# Patient Record
Sex: Female | Born: 1955 | Race: White | Hispanic: No | Smoking: Never smoker
Health system: Southern US, Community
[De-identification: ages and names within clinical notes are randomized; demographics above are authoritative.]

## PROBLEM LIST (undated history)

## (undated) DIAGNOSIS — K219 Gastro-esophageal reflux disease without esophagitis: Secondary | ICD-10-CM

## (undated) DIAGNOSIS — I10 Essential (primary) hypertension: Secondary | ICD-10-CM

## (undated) DIAGNOSIS — L80 Vitiligo: Secondary | ICD-10-CM

## (undated) DIAGNOSIS — H269 Unspecified cataract: Secondary | ICD-10-CM

## (undated) DIAGNOSIS — E079 Disorder of thyroid, unspecified: Secondary | ICD-10-CM

## (undated) HISTORY — DX: Gastro-esophageal reflux disease without esophagitis: K21.9

## (undated) HISTORY — PX: UTERINE FIBROID SURGERY: SHX826

## (undated) HISTORY — DX: Unspecified cataract: H26.9

## (undated) HISTORY — PX: TONSILLECTOMY AND ADENOIDECTOMY: SHX28

## (undated) HISTORY — DX: Vitiligo: L80

## (undated) HISTORY — DX: Essential (primary) hypertension: I10

## (undated) HISTORY — DX: Disorder of thyroid, unspecified: E07.9

---

## 1990-04-23 DIAGNOSIS — B1920 Unspecified viral hepatitis C without hepatic coma: Secondary | ICD-10-CM

## 1990-04-23 HISTORY — DX: Unspecified viral hepatitis C without hepatic coma: B19.20

## 2019-02-24 ENCOUNTER — Other Ambulatory Visit: Payer: Self-pay

## 2019-02-25 ENCOUNTER — Encounter: Payer: Self-pay | Admitting: Family Medicine

## 2019-02-25 ENCOUNTER — Ambulatory Visit (INDEPENDENT_AMBULATORY_CARE_PROVIDER_SITE_OTHER): Payer: 59 | Admitting: Family Medicine

## 2019-02-25 VITALS — BP 149/89 | HR 91 | Temp 98.9°F | Ht 65.0 in | Wt 176.0 lb

## 2019-02-25 DIAGNOSIS — Z8619 Personal history of other infectious and parasitic diseases: Secondary | ICD-10-CM

## 2019-02-25 DIAGNOSIS — E039 Hypothyroidism, unspecified: Secondary | ICD-10-CM | POA: Diagnosis not present

## 2019-02-25 DIAGNOSIS — Z23 Encounter for immunization: Secondary | ICD-10-CM | POA: Diagnosis not present

## 2019-02-25 DIAGNOSIS — R635 Abnormal weight gain: Secondary | ICD-10-CM | POA: Diagnosis not present

## 2019-02-25 DIAGNOSIS — L80 Vitiligo: Secondary | ICD-10-CM | POA: Insufficient documentation

## 2019-02-25 DIAGNOSIS — R030 Elevated blood-pressure reading, without diagnosis of hypertension: Secondary | ICD-10-CM | POA: Insufficient documentation

## 2019-02-25 DIAGNOSIS — R0989 Other specified symptoms and signs involving the circulatory and respiratory systems: Secondary | ICD-10-CM | POA: Insufficient documentation

## 2019-02-25 DIAGNOSIS — E559 Vitamin D deficiency, unspecified: Secondary | ICD-10-CM | POA: Insufficient documentation

## 2019-02-25 DIAGNOSIS — Z7689 Persons encountering health services in other specified circumstances: Secondary | ICD-10-CM

## 2019-02-25 NOTE — Patient Instructions (Signed)
You had labs performed today.  You will be contacted with the results of the labs once they are available, usually in the next 3 business days for routine lab work.  If you have an active my chart account, they will be released to your MyChart.  If you prefer to have these labs released to you via telephone, please let us know.  If you had a pap smear or biopsy performed, expect to be contacted in about 7-10 days.  DASH Eating Plan DASH stands for "Dietary Approaches to Stop Hypertension." The DASH eating plan is a healthy eating plan that has been shown to reduce high blood pressure (hypertension). It may also reduce your risk for type 2 diabetes, heart disease, and stroke. The DASH eating plan may also help with weight loss. What are tips for following this plan?  General guidelines  Avoid eating more than 2,300 mg (milligrams) of salt (sodium) a day. If you have hypertension, you may need to reduce your sodium intake to 1,500 mg a day.  Limit alcohol intake to no more than 1 drink a day for nonpregnant women and 2 drinks a day for men. One drink equals 12 oz of beer, 5 oz of wine, or 1 oz of hard liquor.  Work with your health care provider to maintain a healthy body weight or to lose weight. Ask what an ideal weight is for you.  Get at least 30 minutes of exercise that causes your heart to beat faster (aerobic exercise) most days of the week. Activities may include walking, swimming, or biking.  Work with your health care provider or diet and nutrition specialist (dietitian) to adjust your eating plan to your individual calorie needs. Reading food labels   Check food labels for the amount of sodium per serving. Choose foods with less than 5 percent of the Daily Value of sodium. Generally, foods with less than 300 mg of sodium per serving fit into this eating plan.  To find whole grains, look for the word "whole" as the first word in the ingredient list. Shopping  Buy products labeled  as "low-sodium" or "no salt added."  Buy fresh foods. Avoid canned foods and premade or frozen meals. Cooking  Avoid adding salt when cooking. Use salt-free seasonings or herbs instead of table salt or sea salt. Check with your health care provider or pharmacist before using salt substitutes.  Do not fry foods. Cook foods using healthy methods such as baking, boiling, grilling, and broiling instead.  Cook with heart-healthy oils, such as olive, canola, soybean, or sunflower oil. Meal planning  Eat a balanced diet that includes: ? 5 or more servings of fruits and vegetables each day. At each meal, try to fill half of your plate with fruits and vegetables. ? Up to 6-8 servings of whole grains each day. ? Less than 6 oz of lean meat, poultry, or fish each day. A 3-oz serving of meat is about the same size as a deck of cards. One egg equals 1 oz. ? 2 servings of low-fat dairy each day. ? A serving of nuts, seeds, or beans 5 times each week. ? Heart-healthy fats. Healthy fats called Omega-3 fatty acids are found in foods such as flaxseeds and coldwater fish, like sardines, salmon, and mackerel.  Limit how much you eat of the following: ? Canned or prepackaged foods. ? Food that is high in trans fat, such as fried foods. ? Food that is high in saturated fat, such as fatty meat. ?  Sweets, desserts, sugary drinks, and other foods with added sugar. ? Full-fat dairy products.  Do not salt foods before eating.  Try to eat at least 2 vegetarian meals each week.  Eat more home-cooked food and less restaurant, buffet, and fast food.  When eating at a restaurant, ask that your food be prepared with less salt or no salt, if possible. What foods are recommended? The items listed may not be a complete list. Talk with your dietitian about what dietary choices are best for you. Grains Whole-grain or whole-wheat bread. Whole-grain or whole-wheat pasta. Brown rice. [place]. Bulgur. Whole-grain  and low-sodium cereals. Pita bread. Low-fat, low-sodium crackers. Whole-wheat flour tortillas. Vegetables Fresh or frozen vegetables (raw, steamed, roasted, or grilled). Low-sodium or reduced-sodium tomato and vegetable juice. Low-sodium or reduced-sodium tomato sauce and tomato paste. Low-sodium or reduced-sodium canned vegetables. Fruits All fresh, dried, or frozen fruit. Canned fruit in natural juice (without added sugar). Meat and other protein foods Skinless chicken or [place]. Ground chicken or [place]. Pork with fat trimmed off. Fish and seafood. Egg whites. Dried beans, peas, or lentils. Unsalted nuts, nut butters, and seeds. Unsalted canned beans. Lean cuts of beef with fat trimmed off. Low-sodium, lean deli meat. Dairy Low-fat (1%) or fat-free (skim) milk. Fat-free, low-fat, or reduced-fat cheeses. Nonfat, low-sodium ricotta or cottage cheese. Low-fat or nonfat yogurt. Low-fat, low-sodium cheese. Fats and oils Soft margarine without trans fats. Vegetable oil. Low-fat, reduced-fat, or light mayonnaise and salad dressings (reduced-sodium). Canola, safflower, olive, soybean, and sunflower oils. Avocado. Seasoning and other foods Herbs. Spices. Seasoning mixes without salt. Unsalted popcorn and pretzels. Fat-free sweets. What foods are not recommended? The items listed may not be a complete list. Talk with your dietitian about what dietary choices are best for you. Grains Baked goods made with fat, such as croissants, muffins, or some breads. Dry pasta or rice meal packs. Vegetables Creamed or fried vegetables. Vegetables in a cheese sauce. Regular canned vegetables (not low-sodium or reduced-sodium). Regular canned tomato sauce and paste (not low-sodium or reduced-sodium). Regular tomato and vegetable juice (not low-sodium or reduced-sodium). Angie Fava. Olives. Fruits Canned fruit in a light or heavy syrup. Fried fruit. Fruit in cream or butter sauce. Meat and other protein foods Fatty cuts  of meat. Ribs. Fried meat. Berniece Salines. Sausage. Bologna and other processed lunch meats. Salami. Fatback. Hotdogs. Bratwurst. Salted nuts and seeds. Canned beans with added salt. Canned or smoked fish. Whole eggs or egg yolks. Chicken or [place] with skin. Dairy Whole or 2% milk, cream, and half-and-half. Whole or full-fat cream cheese. Whole-fat or sweetened yogurt. Full-fat cheese. Nondairy creamers. Whipped toppings. Processed cheese and cheese spreads. Fats and oils Butter. Stick margarine. Lard. Shortening. Ghee. Bacon fat. Tropical oils, such as coconut, palm kernel, or palm oil. Seasoning and other foods Salted popcorn and pretzels. Onion salt, garlic salt, seasoned salt, table salt, and sea salt. Worcestershire sauce. Tartar sauce. Barbecue sauce. Teriyaki sauce. Soy sauce, including reduced-sodium. Steak sauce. Canned and packaged gravies. Fish sauce. Oyster sauce. Cocktail sauce. Horseradish that you find on the shelf. Ketchup. Mustard. Meat flavorings and tenderizers. Bouillon cubes. Hot sauce and Tabasco sauce. Premade or packaged marinades. Premade or packaged taco seasonings. Relishes. Regular salad dressings. Where to find more information:  National Heart, Lung, and [place]: https://wilson-eaton.com/  American Heart Association: www.heart.org Summary  The DASH eating plan is a healthy eating plan that has been shown to reduce high blood pressure (hypertension). It may also reduce your risk for type 2 diabetes,  heart disease, and stroke.  With the DASH eating plan, you should limit salt (sodium) intake to 2,300 mg a day. If you have hypertension, you may need to reduce your sodium intake to 1,500 mg a day.  When on the DASH eating plan, aim to eat more fresh fruits and vegetables, whole grains, lean proteins, low-fat dairy, and heart-healthy fats.  Work with your health care provider or diet and nutrition specialist (dietitian) to adjust your eating plan to your individual calorie needs.  This information is not intended to replace advice given to you by your health care provider. Make sure you discuss any questions you have with your health care provider. Document Released: 03/29/2011 Document Revised: 03/22/2017 Document Reviewed: 04/02/2016 Elsevier Patient Education  2020 [employer].

## 2019-02-25 NOTE — Progress Notes (Signed)
 Subjective: CC:establish care, vitamin D deficiency, hypothyroidism HPI: Courtney White is a 63 y.o. female presenting to clinic today for:  1.  Vitamin D deficiency Patient was noted to be vitamin D deficient by her previous PCP.  She is status post completion of 50,000 international units for several weeks.  She would like to know she should continue this medication or not.  2.  Hypothyroidism Patient reports history of hypothyroidism.  She notes that she was previously treated with medication but for some reason it was discontinued.  Unsure if this was checked with her previous labs during her physical last year.  She does report a 20 pound weight gain over the last 3 years it seemed to be more prominent after the passing of her husband.  She denies any history of head or neck surgery.  No known family history of immediate family affected by thyroid disorder but she does have a maternal aunt with thyroid disorder.  She does report a tickle in her throat but denies any change in voice or difficulty swallowing.  No tremor.  No change in bowel habits.  Not on any biotin containing products.  3.  History of hepatitis C Patient reports history of hepatitis C, diagnosed about 4 to 5 years ago.  She is status post treatment and noted to be cleared of the virus.  She has not been having to follow-up with hepatology or gastroenterology for this.  She reports having had a colonoscopy in the last couple of years that was notable for small polyps that did not require resection.  She was told to follow-up in 10 years.  This was done in [place].  Denies any GI symptoms.  4.  Elevated blood pressure Patient noted that she was having an elevated blood pressure to get a previous visit but was never started on any antihypertensives.  Denies any chest pain, shortness of breath, lower extremity edema, dizziness or falls.  She admits that she could work on diet for both weight loss and blood pressure.  She is a  non-smoker.  No drug use.  Occasional alcohol use that is social.  Past Medical History:  Diagnosis Date  . Hepatitis C 1992   s/p treatment.  she has cleared virus  . Thyroid disease   . Vitiligo    History reviewed. No pertinent surgical history. Social History   Socioeconomic History  . Marital status: Significant Other    Spouse name: Not on file  . Number of children: 0  . Years of education: Not on file  . Highest education level: Not on file  Occupational History  . Not on file  Social Needs  . Financial resource strain: Not on file  . Food insecurity    Worry: Not on file    Inability: Not on file  . Transportation needs    Medical: Not on file    Non-medical: Not on file  Tobacco Use  . Smoking status: Never Smoker  . Smokeless tobacco: Never Used  Substance and Sexual Activity  . Alcohol use: Not on file    Comment: occ  . Drug use: Not Currently  . Sexual activity: Yes    Birth control/protection: Post-menopausal  Lifestyle  . Physical activity    Days per week: Not on file    Minutes per session: Not on file  . Stress: Not on file  Relationships  . Social connections    Talks on phone: Not on file    Gets together: Not   on file    Attends religious service: Not on file    Active member of club or organization: Not on file    Attends meetings of clubs or organizations: Not on file    Relationship status: Not on file  . Intimate partner violence    Fear of current or ex partner: Not on file    Emotionally abused: Not on file    Physically abused: Not on file    Forced sexual activity: Not on file  Other Topics Concern  . Not on file  Social History Narrative   Patient is a retired [employer] architect.  She is a widower and notes that she moved from the [place] area after being reunited with a high school sweetheart whom she is now engaged to.   She lives locally in [place].   No children.   Current Meds  Medication Sig  . Vitamin D,  Ergocalciferol, (DRISDOL) 1.25 MG (50000 UT) CAPS capsule TAKE ONE CAPSULE (50,000 UNITS DOSE) BY MOUTH ONCE A WEEK   Family History  Problem Relation Age of Onset  . Diabetes Mother   . Heart disease Mother   . Depression Mother   . Lung cancer Father   . Hypertension Sister   . Hypertension Sister   . Thyroid disease Maternal Aunt    No Known Allergies   Health Maintenance: UTD  ROS: Per HPI  Objective: Office vital signs reviewed. BP (!) 149/89   Pulse 91   Temp 98.9 F (37.2 C) (Temporal)   Ht 5' 5" (1.651 m)   Wt 176 lb (79.8 kg)   BMI 29.29 kg/m   Physical Examination:  General: Awake, alert, well nourished, No acute distress HEENT: Normal; goiter noted at base of neck.  No exophthalmos Cardio: regular rate and rhythm, S1S2 heard, no murmurs appreciated Pulm: clear to auscultation bilaterally, no wheezes, rhonchi or rales; normal work of breathing on room air Extremities: warm, well perfused, No edema, cyanosis or clubbing; +2 pulses bilaterally MSK: Normal gait and station Skin: dry; intact; no rashes; normal temperature.  Depigmentation of hands noted in a vitiliginous pattern Neuro: No resting tremor appreciated  Psych: Mood stable, speech normal, affect appropriate, pleasant and interactive Depression screen [facility] 2/9 02/25/2019  Decreased Interest 0  Down, Depressed, Hopeless 0  PHQ - 2 Score 0  Altered sleeping 0  Tired, decreased energy 0  Change in appetite 0  Feeling bad or failure about yourself  0  Trouble concentrating 0  Moving slowly or fidgety/restless 0  Suicidal thoughts 0  PHQ-9 Score 0    Assessment/ Plan: 63 y.o. female   1. Tickle in throat Possibly related to what appears to be a goiter on exam versus allergies.  I instructed her to go ahead and start an oral antihistamine.  We will check thyroid levels.  2. Establishing care with new doctor, encounter for  3. Vitiligo  4. Weight gain Possibly related to untreated  hypothyroidism.  Reinforced lifestyle modification, particularly given elevated blood pressure during today's visit  5. Acquired hypothyroidism Check thyroid level, liver function tests - Thyroid Panel With TSH - CMP14+EGFR  6. Vitamin D deficiency Recheck vitamin D level given status post treatment with vitamin D replacement - Vitamin D 25 hydroxy  7. Elevated blood pressure reading in office without diagnosis of hypertension Plan for DASH diet, lifestyle modification.  She will follow-up in 4 weeks to get rechecked.  If unable to achieve appropriate blood pressure of less than 140/90 with lifestyle  modification, plan to initiate oral antihypertensive.  8. History of hepatitis C S/p treatment and clearance of virus.   Ashly M Gottschalk, DO [facility] [phone]   

## 2019-02-26 LAB — CMP14+EGFR
ALT: 19 IU/L (ref 0–32)
AST: 19 IU/L (ref 0–40)
Albumin/Globulin Ratio: 1.6 (ref 1.2–2.2)
Albumin: 4.6 g/dL (ref 3.8–4.8)
Alkaline Phosphatase: 95 IU/L (ref 39–117)
BUN/Creatinine Ratio: 18 (ref 12–28)
BUN: 11 mg/dL (ref 8–27)
Bilirubin Total: 0.3 mg/dL (ref 0.0–1.2)
CO2: 25 mmol/L (ref 20–29)
Calcium: 9.9 mg/dL (ref 8.7–10.3)
Chloride: 104 mmol/L (ref 96–106)
Creatinine, Ser: 0.62 mg/dL (ref 0.57–1.00)
GFR calc Af Amer: 110 mL/min/{1.73_m2} (ref >59)
GFR calc non Af Amer: 96 mL/min/{1.73_m2} (ref >59)
Globulin, Total: 2.8 g/dL (ref 1.5–4.5)
Glucose: 132 mg/dL — ABNORMAL HIGH (ref 65–99)
Potassium: 4.2 mmol/L (ref 3.5–5.2)
Sodium: 140 mmol/L (ref 134–144)
Total Protein: 7.4 g/dL (ref 6.0–8.5)

## 2019-02-26 LAB — THYROID PANEL WITH TSH
Free Thyroxine Index: 1.7 (ref 1.2–4.9)
T3 Uptake Ratio: 24 % (ref 24–39)
T4, Total: 7 ug/dL (ref 4.5–12.0)
TSH: 6.59 u[IU]/mL — ABNORMAL HIGH (ref 0.450–4.500)

## 2019-02-26 LAB — VITAMIN D 25 HYDROXY (VIT D DEFICIENCY, FRACTURES): Vit D, 25-Hydroxy: 29.6 ng/mL — ABNORMAL LOW (ref 30.0–100.0)

## 2019-02-27 ENCOUNTER — Telehealth: Payer: Self-pay | Admitting: Family Medicine

## 2019-02-27 DIAGNOSIS — E039 Hypothyroidism, unspecified: Secondary | ICD-10-CM

## 2019-02-27 MED ORDER — LEVOTHYROXINE SODIUM 75 MCG PO TABS: 75.0000 ug | ORAL_TABLET | Freq: Every day | ORAL | 0 refills | Status: DC

## 2019-02-27 NOTE — Telephone Encounter (Signed)
Attempted to reach out to her with regards to her recent laboratory results.  Her thyroid function is not controlled.  I am going to prescribe her thyroid replacement that is weight-based.  Plan for 75 mcg every morning with recheck of thyroid in about 6 to 8 weeks.  Sugar was slightly elevated but this may reflect nonfasting status.    Liver function, electrolytes and kidney function appear normal Vitamin D was still slightly low but I do not think that she needs high-dose replacement going forward.  Okay to take vitamin D OTC 800 international units daily.

## 2019-03-03 ENCOUNTER — Other Ambulatory Visit: Payer: Self-pay | Admitting: Family Medicine

## 2019-03-30 ENCOUNTER — Other Ambulatory Visit: Payer: Self-pay

## 2019-03-31 ENCOUNTER — Ambulatory Visit (INDEPENDENT_AMBULATORY_CARE_PROVIDER_SITE_OTHER): Payer: 59 | Admitting: Family Medicine

## 2019-03-31 ENCOUNTER — Encounter: Payer: Self-pay | Admitting: Family Medicine

## 2019-03-31 VITALS — BP 170/74 | HR 93 | Temp 98.4°F | Ht 65.0 in | Wt 173.0 lb

## 2019-03-31 DIAGNOSIS — F439 Reaction to severe stress, unspecified: Secondary | ICD-10-CM | POA: Insufficient documentation

## 2019-03-31 DIAGNOSIS — I10 Essential (primary) hypertension: Secondary | ICD-10-CM | POA: Diagnosis not present

## 2019-03-31 DIAGNOSIS — E039 Hypothyroidism, unspecified: Secondary | ICD-10-CM | POA: Diagnosis not present

## 2019-03-31 DIAGNOSIS — Z818 Family history of other mental and behavioral disorders: Secondary | ICD-10-CM | POA: Diagnosis not present

## 2019-03-31 DIAGNOSIS — R739 Hyperglycemia, unspecified: Secondary | ICD-10-CM

## 2019-03-31 LAB — BAYER DCA HB A1C WAIVED: HB A1C (BAYER DCA - WAIVED): 5.8 % (ref <7.0)

## 2019-03-31 NOTE — Patient Instructions (Signed)
I will reach out to our clinical social worker for additional resources.  If you are having trouble with sleep, you may safely take 3 mg of melatonin at bedtime.  This will not interact with your medications.   I have given you information on DASH diet and how to check blood pressure at home.    Dementia Caregiver Guide Dementia is a term used to describe a number of symptoms that affect memory and thinking. The most common symptoms include:  Memory loss.  Trouble with language and communication.  Trouble concentrating.  Poor judgment.  Problems with reasoning.  Child-like behavior and language.  Extreme anxiety.  Angry outbursts.  Wandering from home or public places. Dementia usually gets worse slowly over time. In the early stages, people with dementia can stay independent and safe with some help. In later stages, they need help with daily tasks such as dressing, grooming, and using the bathroom. How to help the person with dementia cope Dementia can be frightening and confusing. Here are some tips to help the person with dementia cope with changes caused by the disease. General tips  Keep the person on track with his or her routine.  Try to identify areas where the person may need help.  Be supportive, patient, calm, and encouraging.  Gently remind the person that adjusting to changes takes time.  Help with the tasks that the person has asked for help with.  Keep the person involved in daily tasks and decisions as much as possible.  Encourage conversation, but try not to get frustrated or harried if the person struggles to find words or does not seem to appreciate your help. Communication tips  When the person is talking or seems frustrated, make eye contact and hold the person's hand.  Ask specific questions that need yes or no answers.  Use simple words, short sentences, and a calm voice. Only give one direction at a time.  When offering choices, limit them to  just 1 or 2.  Avoid correcting the person in a negative way.  If the person is struggling to find the right words, gently try to help him or her. How to recognize symptoms of stress Symptoms of stress in caregivers include:  Feeling frustrated or angry with the person with dementia.  Denying that the person has dementia or that his or her symptoms will not improve.  Feeling hopeless and unappreciated.  Difficulty sleeping.  Difficulty concentrating.  Feeling anxious, irritable, or depressed.  Developing stress-related health problems.  Feeling like you have too little time for your own life. Follow these instructions at home:   Make sure that you and the person you are caring for: ? Get regular sleep. ? Exercise regularly. ? Eat regular, nutritious meals. ? Drink enough fluid to keep your urine clear or pale yellow. ? Take over-the-counter and prescription medicines only as told by your health care providers. ? Attend all scheduled health care appointments.  Join a support group with others who are caregivers.  Ask about respite care resources so that you can have a regular break from the stress of caregiving.  Look for signs of stress in yourself and in the person you are caring for. If you notice signs of stress, take steps to manage it.  Consider any safety risks and take steps to avoid them.  Organize medications in a pill box for each day of the week.  Create a plan to handle any legal or financial matters. Get legal or financial  advice if needed.  Keep a calendar in a central location to remind the person of appointments or other activities. Tips for reducing the risk of injury  Keep floors clear of clutter. Remove rugs, magazine racks, and floor lamps.  Keep hallways well lit, especially at night.  Put a handrail and nonslip mat in the bathtub or shower.  Put childproof locks on cabinets that contain dangerous items, such as medicines, alcohol, guns,  toxic cleaning items, sharp tools or utensils, matches, and lighters.  Put the locks in places where the person cannot see or reach them easily. This will help ensure that the person does not wander out of the house and get lost.  Be prepared for emergencies. Keep a list of emergency phone numbers and addresses in a convenient area.  Remove car keys and lock garage doors so that the person does not try to get in the car and drive.  Have the person wear a bracelet that tracks locations and identifies the person as having memory problems. This should be worn at all times for safety. Where to find support: Many individuals and organizations offer support. These include:  Support groups for people with dementia and for caregivers.  Counselors or therapists.  Home health care services.  Adult day care centers. Where to find more information Alzheimer's Association: [employer] Contact a health care provider if:  The person's health is rapidly getting worse.  You are no longer able to care for the person.  Caring for the person is affecting your physical and emotional health.  The person threatens himself or herself, you, or anyone else. Summary  Dementia is a term used to describe a number of symptoms that affect memory and thinking.  Dementia usually gets worse slowly over time.  Take steps to reduce the person's risk of injury, and to plan for future care.  Caregivers need support, relief from caregiving, and time for their own lives. This information is not intended to replace advice given to you by your health care provider. Make sure you discuss any questions you have with your health care provider. Document Released: 03/13/2016 Document Revised: 03/22/2017 Document Reviewed: 03/13/2016 Elsevier Patient Education  2020 [employer]. How to Take Your Blood Pressure You can take your blood pressure at home with a machine. You may need to check your blood pressure at home:   To check if you have high blood pressure (hypertension).  To check your blood pressure over time.  To make sure your blood pressure medicine is working. Supplies needed: You will need a blood pressure machine, or monitor. You can buy one at a drugstore or online. When choosing one:  Choose one with an arm cuff.  Choose one that wraps around your upper arm. Only one finger should fit between your arm and the cuff.  Do not choose one that measures your blood pressure from your wrist or finger. Your doctor can suggest a monitor. How to prepare Avoid these things for 30 minutes before checking your blood pressure:  Drinking caffeine.  Drinking alcohol.  Eating.  Smoking.  Exercising. Five minutes before checking your blood pressure:  Pee.  Sit in a dining chair. Avoid sitting in a soft couch or armchair.  Be quiet. Do not talk. How to take your blood pressure Follow the instructions that came with your machine. If you have a digital blood pressure monitor, these may be the instructions: 1. Sit up straight. 2. Place your feet on the floor. Do not cross  your ankles or legs. 3. Rest your left arm at the level of your heart. You may rest it on a table, desk, or chair. 4. Pull up your shirt sleeve. 5. Wrap the blood pressure cuff around the upper part of your left arm. The cuff should be 1 inch (2.5 cm) above your elbow. It is best to wrap the cuff around bare skin. 6. Fit the cuff snugly around your arm. You should be able to place only one finger between the cuff and your arm. 7. Put the cord inside the groove of your elbow. 8. Press the power button. 9. Sit quietly while the cuff fills with air and loses air. 10. Write down the numbers on the screen. 11. Wait 2-3 minutes and then repeat steps 1-10. What do the numbers mean? Two numbers make up your blood pressure. The first number is called systolic pressure. The second is called diastolic pressure. An example of a blood  pressure reading is "120 over 80" (or 120/80). If you are an adult and do not have a medical condition, use this guide to find out if your blood pressure is normal: Normal  First number: below 120.  Second number: below 80. Elevated  First number: 120-129.  Second number: below 80. Hypertension stage 1  First number: 130-139.  Second number: 80-89. Hypertension stage 2  First number: 140 or above.  Second number: 70 or above. Your blood pressure is above normal even if only the top or bottom number is above normal. Follow these instructions at home:  Check your blood pressure as often as your doctor tells you to.  Take your monitor to your next doctor's appointment. Your doctor will: ? Make sure you are using it correctly. ? Make sure it is working right.  Make sure you understand what your blood pressure numbers should be.  Tell your doctor if your medicines are causing side effects. Contact a doctor if:  Your blood pressure keeps being high. Get help right away if:  Your first blood pressure number is higher than 180.  Your second blood pressure number is higher than 120. This information is not intended to replace advice given to you by your health care provider. Make sure you discuss any questions you have with your health care provider. Document Released: 03/22/2008 Document Revised: 03/22/2017 Document Reviewed: 09/16/2015 Elsevier Patient Education  2020 [place] DASH stands for "Dietary Approaches to Stop Hypertension." The DASH eating plan is a healthy eating plan that has been shown to reduce high blood pressure (hypertension). It may also reduce your risk for type 2 diabetes, heart disease, and stroke. The DASH eating plan may also help with weight loss. What are tips for following this plan?  General guidelines  Avoid eating more than 2,300 mg (milligrams) of salt (sodium) a day. If you have hypertension, you may need to reduce  your sodium intake to 1,500 mg a day.  Limit alcohol intake to no more than 1 drink a day for nonpregnant women and 2 drinks a day for men. One drink equals 12 oz of beer, 5 oz of wine, or 1 oz of hard liquor.  Work with your health care provider to maintain a healthy body weight or to lose weight. Ask what an ideal weight is for you.  Get at least 30 minutes of exercise that causes your heart to beat faster (aerobic exercise) most days of the week. Activities may include walking, swimming, or biking.  Work with  your health care provider or diet and nutrition specialist (dietitian) to adjust your eating plan to your individual calorie needs. Reading food labels   Check food labels for the amount of sodium per serving. Choose foods with less than 5 percent of the Daily Value of sodium. Generally, foods with less than 300 mg of sodium per serving fit into this eating plan.  To find whole grains, look for the word "whole" as the first word in the ingredient list. Shopping  Buy products labeled as "low-sodium" or "no salt added."  Buy fresh foods. Avoid canned foods and premade or frozen meals. Cooking  Avoid adding salt when cooking. Use salt-free seasonings or herbs instead of table salt or sea salt. Check with your health care provider or pharmacist before using salt substitutes.  Do not fry foods. Cook foods using healthy methods such as baking, boiling, grilling, and broiling instead.  Cook with heart-healthy oils, such as olive, canola, soybean, or sunflower oil. Meal planning  Eat a balanced diet that includes: ? 5 or more servings of fruits and vegetables each day. At each meal, try to fill half of your plate with fruits and vegetables. ? Up to 6-8 servings of whole grains each day. ? Less than 6 oz of lean meat, poultry, or fish each day. A 3-oz serving of meat is about the same size as a deck of cards. One egg equals 1 oz. ? 2 servings of low-fat dairy each day. ? A serving  of nuts, seeds, or beans 5 times each week. ? Heart-healthy fats. Healthy fats called Omega-3 fatty acids are found in foods such as flaxseeds and coldwater fish, like sardines, salmon, and mackerel.  Limit how much you eat of the following: ? Canned or prepackaged foods. ? Food that is high in trans fat, such as fried foods. ? Food that is high in saturated fat, such as fatty meat. ? Sweets, desserts, sugary drinks, and other foods with added sugar. ? Full-fat dairy products.  Do not salt foods before eating.  Try to eat at least 2 vegetarian meals each week.  Eat more home-cooked food and less restaurant, buffet, and fast food.  When eating at a restaurant, ask that your food be prepared with less salt or no salt, if possible. What foods are recommended? The items listed may not be a complete list. Talk with your dietitian about what dietary choices are best for you. Grains Whole-grain or whole-wheat bread. Whole-grain or whole-wheat pasta. Brown rice. [place]. Bulgur. Whole-grain and low-sodium cereals. Pita bread. Low-fat, low-sodium crackers. Whole-wheat flour tortillas. Vegetables Fresh or frozen vegetables (raw, steamed, roasted, or grilled). Low-sodium or reduced-sodium tomato and vegetable juice. Low-sodium or reduced-sodium tomato sauce and tomato paste. Low-sodium or reduced-sodium canned vegetables. Fruits All fresh, dried, or frozen fruit. Canned fruit in natural juice (without added sugar). Meat and other protein foods Skinless chicken or [place]. Ground chicken or [place]. Pork with fat trimmed off. Fish and seafood. Egg whites. Dried beans, peas, or lentils. Unsalted nuts, nut butters, and seeds. Unsalted canned beans. Lean cuts of beef with fat trimmed off. Low-sodium, lean deli meat. Dairy Low-fat (1%) or fat-free (skim) milk. Fat-free, low-fat, or reduced-fat cheeses. Nonfat, low-sodium ricotta or cottage cheese. Low-fat or nonfat yogurt. Low-fat, low-sodium  cheese. Fats and oils Soft margarine without trans fats. Vegetable oil. Low-fat, reduced-fat, or light mayonnaise and salad dressings (reduced-sodium). Canola, safflower, olive, soybean, and sunflower oils. Avocado. Seasoning and other foods Herbs. Spices. Seasoning mixes without salt.  Unsalted popcorn and pretzels. Fat-free sweets. What foods are not recommended? The items listed may not be a complete list. Talk with your dietitian about what dietary choices are best for you. Grains Baked goods made with fat, such as croissants, muffins, or some breads. Dry pasta or rice meal packs. Vegetables Creamed or fried vegetables. Vegetables in a cheese sauce. Regular canned vegetables (not low-sodium or reduced-sodium). Regular canned tomato sauce and paste (not low-sodium or reduced-sodium). Regular tomato and vegetable juice (not low-sodium or reduced-sodium). Angie Fava. Olives. Fruits Canned fruit in a light or heavy syrup. Fried fruit. Fruit in cream or butter sauce. Meat and other protein foods Fatty cuts of meat. Ribs. Fried meat. Berniece Salines. Sausage. Bologna and other processed lunch meats. Salami. Fatback. Hotdogs. Bratwurst. Salted nuts and seeds. Canned beans with added salt. Canned or smoked fish. Whole eggs or egg yolks. Chicken or [place] with skin. Dairy Whole or 2% milk, cream, and half-and-half. Whole or full-fat cream cheese. Whole-fat or sweetened yogurt. Full-fat cheese. Nondairy creamers. Whipped toppings. Processed cheese and cheese spreads. Fats and oils Butter. Stick margarine. Lard. Shortening. Ghee. Bacon fat. Tropical oils, such as coconut, palm kernel, or palm oil. Seasoning and other foods Salted popcorn and pretzels. Onion salt, garlic salt, seasoned salt, table salt, and sea salt. Worcestershire sauce. Tartar sauce. Barbecue sauce. Teriyaki sauce. Soy sauce, including reduced-sodium. Steak sauce. Canned and packaged gravies. Fish sauce. Oyster sauce. Cocktail sauce. Horseradish  that you find on the shelf. Ketchup. Mustard. Meat flavorings and tenderizers. Bouillon cubes. Hot sauce and Tabasco sauce. Premade or packaged marinades. Premade or packaged taco seasonings. Relishes. Regular salad dressings. Where to find more information:  National Heart, Lung, and [place]: https://wilson-eaton.com/  American Heart Association: www.heart.org Summary  The DASH eating plan is a healthy eating plan that has been shown to reduce high blood pressure (hypertension). It may also reduce your risk for type 2 diabetes, heart disease, and stroke.  With the DASH eating plan, you should limit salt (sodium) intake to 2,300 mg a day. If you have hypertension, you may need to reduce your sodium intake to 1,500 mg a day.  When on the DASH eating plan, aim to eat more fresh fruits and vegetables, whole grains, lean proteins, low-fat dairy, and heart-healthy fats.  Work with your health care provider or diet and nutrition specialist (dietitian) to adjust your eating plan to your individual calorie needs. This information is not intended to replace advice given to you by your health care provider. Make sure you discuss any questions you have with your health care provider. Document Released: 03/29/2011 Document Revised: 03/22/2017 Document Reviewed: 04/02/2016 Elsevier Patient Education  2020 [employer].

## 2019-03-31 NOTE — Progress Notes (Signed)
Subjective: CC: Stress at home PCP: [name], DO ME:[id number] Courtney White is a 63 y.o. female presenting to clinic today for:  1.  Stress at home Patient reports some increased stress at home surrounding her mother.  She notes that her mother has early stages of dementia and has history of OCD/hoarding disorder.  She recently cleaned out her mother's home in place many of the excess belongings in storage.  Her mother waxes and wanes in mood and has often place a lot of blame on the patient with regards to her possessions not being readily available.  She is wondering if there are any options for counseling and/or support for caregivers.  She is the primary caregiver for her mother, who does reside independently.  Patient is the oldest of her siblings and actually lives a furthest away but is often the one who cares for her mother.  She does not feel that she is having any depression or anxiety symptoms but does identify stress being an issue as of late.  She has not yet reached out to any support groups with the Alzheimer's Association but would be interested in doing this.  She did report some difficulty with sleep, particularly when she has had negative interactions with her mother.  She is not taking anything for this.  She feels well supported by her fianc.  2.  Hypertension Patient with persistently elevated blood pressure.  She is not keeping an eye on her blood pressures.  She does identify that she could be better with salt intake.  No chest pain, shortness of breath, dizziness or falls.  3.  Hypothyroidism Patient reports compliance with Synthroid 75 mcg daily.  She has had some difficulty with sleep as above but this is intermittent and seems to be situational.  No heart palpitations, tremor, change in energy or bowel habits.  She has had a 3 pound weight loss which she seems pleased about.  ROS: Per HPI  No Known Allergies Past Medical History:  Diagnosis Date  . Hepatitis C  1992   s/p treatment.  she has cleared virus  . Thyroid disease   . Vitiligo     Current Outpatient Medications:  .  levothyroxine (SYNTHROID) 75 MCG tablet, Take 1 tablet (75 mcg total) by mouth daily., Disp: 90 tablet, Rfl: 0 .  Vitamin D, Ergocalciferol, (DRISDOL) 1.25 MG (50000 UT) CAPS capsule, TAKE ONE CAPSULE (50,000 UNITS DOSE) BY MOUTH ONCE A WEEK, Disp: 4 capsule, Rfl: 0 Social History   Socioeconomic History  . Marital status: Significant Other    Spouse name: Not on file  . Number of children: 0  . Years of education: Not on file  . Highest education level: Not on file  Occupational History  . Not on file  Social Needs  . Financial resource strain: Not on file  . Food insecurity    Worry: Not on file    Inability: Not on file  . Transportation needs    Medical: Not on file    Non-medical: Not on file  Tobacco Use  . Smoking status: Never Smoker  . Smokeless tobacco: Never Used  Substance and Sexual Activity  . Alcohol use: Not on file    Comment: occ  . Drug use: Not Currently  . Sexual activity: Yes    Birth control/protection: Post-menopausal  Lifestyle  . Physical activity    Days per week: Not on file    Minutes per session: Not on file  . Stress: Not  on file  Relationships  . Social Herbalist on phone: Not on file    Gets together: Not on file    Attends religious service: Not on file    Active member of club or organization: Not on file    Attends meetings of clubs or organizations: Not on file    Relationship status: Not on file  . Intimate partner violence    Fear of current or ex partner: Not on file    Emotionally abused: Not on file    Physically abused: Not on file    Forced sexual activity: Not on file  Other Topics Concern  . Not on file  Social History Narrative   Patient is a retired [employer] architect.  She is a widower and notes that she moved from the [place] area after being reunited with a high school sweetheart whom  she is now engaged to.   She lives locally in [place].   No children.   Family History  Problem Relation Age of Onset  . Diabetes Mother   . Heart disease Mother   . Depression Mother   . Lung cancer Father   . Hypertension Sister   . Hypertension Sister   . Thyroid disease Maternal Aunt     Objective: Office vital signs reviewed. BP (!) 170/74   Pulse 93   Temp 98.4 F (36.9 C) (Temporal)   Ht 5\' 5"  (1.651 m)   Wt 173 lb (78.5 kg)   SpO2 98%   BMI 28.79 kg/m   Physical Examination:  General: Awake, alert, well nourished, No acute distress HEENT: Normal, sclera white, no exophthalmos.  MMM Cardio: regular rate and rhythm, S1S2 heard, no murmurs appreciated Pulm: clear to auscultation bilaterally, no wheezes, rhonchi or rales; normal work of breathing on room air Extremities: warm, well perfused, No edema, cyanosis or clubbing; +2 pulses bilaterally Skin: Normal temperature Psych: mood stable, speech normal, affect appropriate. Thought process linear. Depression screen [facility] 2/9 03/31/2019 02/25/2019  Decreased Interest 0 0  Down, Depressed, Hopeless 0 0  PHQ - 2 Score 0 0  Altered sleeping 0 0  Tired, decreased energy 0 0  Change in appetite 0 0  Feeling bad or failure about yourself  0 0  Trouble concentrating 0 0  Moving slowly or fidgety/restless 0 0  Suicidal thoughts 0 0  PHQ-9 Score 0 0   Assessment/ Plan: 63 y.o. female   1. Stress at home Situational and related to her mother's declining health.  I have given her information on the Alzheimer's Association.  I will also reach out to [place], our clinical social worker, to see if he has any local resources that may be beneficial to the patient.  I did have a frank conversation with her with regards to her mother's advanced directive and living will.  It appears that they have discussed this on multiple occasions with her mother but unfortunately she was not able to make any decisions.  I do worry that her mother  will likely need advanced care at some point.  Would consider formal evaluation by neurology to evaluate for capacity.  Patient would most certainly benefit of some assistance with care for her mother.  2. Family history of dementia  3. Essential hypertension Not controlled.  DASH diet reinforced.  She will follow-up in office in 2 weeks for blood pressure check with the nurse.  If still above 140/90, plan to initiate Norvasc 5 mg daily.  She will follow-up  with me in 4 weeks for interval checkup.  4. Acquired hypothyroidism Check thyroid panel. - Thyroid Panel With TSH  5. Elevated blood sugar - Bayer DCA Hb A1c Waived   No orders of the defined types were placed in this encounter.  No orders of the defined types were placed in this encounter.    [name], DO [place] [phone]

## 2019-04-01 LAB — THYROID PANEL WITH TSH
Free Thyroxine Index: 2.2 (ref 1.2–4.9)
T3 Uptake Ratio: 25 % (ref 24–39)
T4, Total: 8.6 ug/dL (ref 4.5–12.0)
TSH: 3.6 u[IU]/mL (ref 0.450–4.500)

## 2019-04-02 ENCOUNTER — Telehealth: Payer: Self-pay | Admitting: Family Medicine

## 2019-04-02 NOTE — Telephone Encounter (Signed)
-----   Message from Waverly Ferrari, [place] sent at 04/02/2019 10:36 AM EST ----- Please inform Ms Reckner of local support for caring for loved ones with dementia:  The 2 recommendations I would have would be for Hayly to contact the Social work Dept at [facility] in [place], She can ask them about the Alzheimer's Support group through the Social Work Dept. They had previously had monthly meetings for caregivers with family members with Alzheimer's /Dementia. This was a well established group.   Also,[facility] in [place], [place] has caregivers or companion sitters for hire by the hour. The companion sitters are less expensive per hour than CNA care givers. I think they have a 3 hour minimum for caregivers to come out. These are both reputable services available that I am aware of . Thanks [employer]

## 2019-04-02 NOTE — Telephone Encounter (Signed)
Patient is aware of Dr.G recommendations. She was very thankful. Aslo wants Dr. Darnell Level to know she is keeping a log of her BP at home the last one was 139/88. She will be keeping a log and bring to next visit.

## 2019-05-08 ENCOUNTER — Other Ambulatory Visit: Payer: Self-pay | Admitting: Family Medicine

## 2019-05-28 ENCOUNTER — Other Ambulatory Visit: Payer: Self-pay | Admitting: Family Medicine

## 2019-05-28 DIAGNOSIS — E039 Hypothyroidism, unspecified: Secondary | ICD-10-CM

## 2019-09-30 ENCOUNTER — Other Ambulatory Visit: Payer: Self-pay | Admitting: Family Medicine

## 2019-09-30 ENCOUNTER — Encounter: Payer: Self-pay | Admitting: Physician Assistant

## 2019-09-30 ENCOUNTER — Ambulatory Visit: Payer: 59 | Admitting: Physician Assistant

## 2019-09-30 ENCOUNTER — Other Ambulatory Visit: Payer: Self-pay

## 2019-09-30 VITALS — BP 155/102 | HR 82 | Temp 98.2°F | Resp 20 | Ht 65.0 in | Wt 170.0 lb

## 2019-09-30 DIAGNOSIS — K219 Gastro-esophageal reflux disease without esophagitis: Secondary | ICD-10-CM

## 2019-09-30 DIAGNOSIS — R030 Elevated blood-pressure reading, without diagnosis of hypertension: Secondary | ICD-10-CM

## 2019-09-30 DIAGNOSIS — E039 Hypothyroidism, unspecified: Secondary | ICD-10-CM

## 2019-09-30 MED ORDER — LEVOTHYROXINE SODIUM 75 MCG PO TABS: 75.0000 ug | ORAL_TABLET | Freq: Every day | ORAL | 0 refills | Status: DC

## 2019-09-30 NOTE — Telephone Encounter (Signed)
  Prescription Request  09/30/2019  What is the name of the medication or equipment? levothyroxine (SYNTHROID) 75 MCG tablet  90 day rx  Have you contacted your pharmacy to request a refill? (if applicable) yes  Which pharmacy would you like this sent to? [facility] in [place]   Patient notified that their request is being sent to the clinical staff for review and that they should receive a response within 2 business days.

## 2019-09-30 NOTE — Progress Notes (Signed)
  Subjective:     Patient ID: Courtney White, female   DOB: November 24, 1955, 64 y.o.   MRN: [id number]  HPI Pt with reflux type sx for 2-3 months Sx are worse after eating Will have discomfort /burning in the upper abd to lower sternal area Denies nay N/V Has tried boyfriends reflux meds and sx improved Sx started after being on Pred and then NSAID'S for heel spur  Review of Systems  Constitutional: Negative for activity change, appetite change, fatigue and fever.  HENT: Negative.   Respiratory: Negative.   Cardiovascular: Negative.   Gastrointestinal: Negative for abdominal distention, abdominal pain, constipation, diarrhea, nausea and vomiting.       Objective:   Physical Exam Vitals and nursing note reviewed.  Constitutional:      General: She is not in acute distress.    Appearance: Normal appearance. She is not ill-appearing or toxic-appearing.  Abdominal:     General: There is no distension.     Palpations: Abdomen is soft. There is no mass.     Tenderness: There is no abdominal tenderness. There is no guarding or rebound.     Hernia: No hernia is present.  Neurological:     Mental Status: She is alert.        Assessment:     1. Elevated blood pressure reading in office without diagnosis of hypertension   2. Gastroesophageal reflux disease, unspecified whether esophagitis present        Plan:     Discussed sx prob due to Pred and NSAID use Avoid irritants - caff, ETOH, NSAID's Smaller meals Stay upright 20 min after eating Trial of OTC meds to see if helps sx  If no better f/u Note was made of BP reading today-pt sates she monitors at home and the sys reading is normally in 130 range so hold tx today. Pt will bring readings at next OV

## 2019-09-30 NOTE — Patient Instructions (Signed)

## 2019-10-01 ENCOUNTER — Telehealth: Payer: Self-pay | Admitting: Family Medicine

## 2019-10-01 NOTE — Telephone Encounter (Signed)
Patient has a follow up appointment scheduled. 

## 2019-10-01 NOTE — Telephone Encounter (Signed)
Pt wants here yesterday and felt like her visit was a waste of time. She wants to see Lajuana Ripple only asap. She was concerned with bp of 155/102.  It has been running high at home and provider did not think she needed to be on bp medication and pt feels like he should have been given her a rx, She felt like everything she said was ignored. Pt says that left arm has been hurting--thinks it may have to do with her heart. She also wants a referral to GI. She felt that the provide what did not help at all. Please call back

## 2019-10-19 ENCOUNTER — Encounter: Payer: Self-pay | Admitting: Family Medicine

## 2019-10-19 ENCOUNTER — Ambulatory Visit (INDEPENDENT_AMBULATORY_CARE_PROVIDER_SITE_OTHER): Payer: Medicare Other | Admitting: Family Medicine

## 2019-10-19 ENCOUNTER — Other Ambulatory Visit: Payer: Self-pay

## 2019-10-19 VITALS — BP 145/87 | HR 92 | Temp 97.8°F | Ht 65.0 in | Wt 173.4 lb

## 2019-10-19 DIAGNOSIS — R0789 Other chest pain: Secondary | ICD-10-CM | POA: Diagnosis not present

## 2019-10-19 DIAGNOSIS — Z23 Encounter for immunization: Secondary | ICD-10-CM

## 2019-10-19 DIAGNOSIS — G8929 Other chronic pain: Secondary | ICD-10-CM

## 2019-10-19 DIAGNOSIS — M79672 Pain in left foot: Secondary | ICD-10-CM

## 2019-10-19 DIAGNOSIS — I10 Essential (primary) hypertension: Secondary | ICD-10-CM

## 2019-10-19 DIAGNOSIS — M25571 Pain in right ankle and joints of right foot: Secondary | ICD-10-CM

## 2019-10-19 DIAGNOSIS — E039 Hypothyroidism, unspecified: Secondary | ICD-10-CM | POA: Diagnosis not present

## 2019-10-19 DIAGNOSIS — R5383 Other fatigue: Secondary | ICD-10-CM

## 2019-10-19 DIAGNOSIS — H9313 Tinnitus, bilateral: Secondary | ICD-10-CM

## 2019-10-19 DIAGNOSIS — Z114 Encounter for screening for human immunodeficiency virus [HIV]: Secondary | ICD-10-CM

## 2019-10-19 MED ORDER — AMLODIPINE BESYLATE 5 MG PO TABS: 5.0000 mg | ORAL_TABLET | Freq: Every day | ORAL | 0 refills | Status: DC

## 2019-10-19 NOTE — Progress Notes (Signed)
Subjective: CC: Follow-up hypertension, multiple other concerns PCP: Courtney Norlander, DO ITG:PQDIYM [name] is a 64 y.o. female presenting to clinic today for:  1. Hypertension/atypical chest pain Patient presents today for follow-up on blood pressure, which has been persistently elevated.  She brings a blood pressure log in today which shows systolic blood pressures ranging anywhere from 1 28-1 48 over 80s to 100s.  Typically blood pressures are in the 130s over 90s.  She does report some atypical left-sided chest pain that feels more like a cramping sensation.  She has occasional radiation down the left upper extremity that she describes as a numbness.  Does not report any associated nausea, vomiting, diaphoresis, shortness of breath or edema.  She does report family history significant for cardiovascular disease in her mother who required cardiac cath previously.  Patient is a non-smoker.  2. Hypothyroidism/fatigue Patient reports compliance with Synthroid 75 mcg daily.  She has been having some fatigue and difficulty with weight loss.  She thought perhaps she might have iron deficiency and started taking some iron recently.  She is considering taking B12 but has not yet started this.  3.  Ankle pain/heel pain Patient reports couple day history of right-sided ankle pain.  She is had a several month history of left-sided posterior heel pain.  She is actually seeing the podiatrist for the left heel pain and was given a corticosteroid injection posteriorly.  She notes that this did not help.  She has started wearing different shoes and this does give a little bit more support.  Does not report any sensory changes in the feet but would like to have this further addressed as symptoms are refractory to treatment thus far.  4.  Tinnitus Patient reports that she has had several months of tenderness since she tripped and fell in the [employer] parking lot and struck her head.  During the daytime,  symptoms are not very prevalent but in the evening time when she is trying to sleep she has constant ringing in her ear such that it interferes with her ability to rest.  Denies any other concerns.  Does not report any blurred or double vision.  She uses corrective lenses.  Does not report any balance issues.  ROS: Per HPI  No Known Allergies Past Medical History:  Diagnosis Date  . Hepatitis C 1992   s/p treatment.  she has cleared virus  . Thyroid disease   . Vitiligo     Current Outpatient Medications:  .  levothyroxine (SYNTHROID) 75 MCG tablet, Take 1 tablet (75 mcg total) by mouth daily., Disp: 30 tablet, Rfl: 0 .  Vitamin D, Ergocalciferol, (DRISDOL) 1.25 MG (50000 UT) CAPS capsule, TAKE ONE CAPSULE (50,000 UNITS DOSE) BY MOUTH ONCE A WEEK, Disp: 4 capsule, Rfl: 0 Social History   Socioeconomic History  . Marital status: Significant Other    Spouse name: Not on file  . Number of children: 0  . Years of education: Not on file  . Highest education level: Not on file  Occupational History  . Not on file  Tobacco Use  . Smoking status: Never Smoker  . Smokeless tobacco: Never Used  Vaping Use  . Vaping Use: Never used  Substance and Sexual Activity  . Alcohol use: Not on file    Comment: occ  . Drug use: Not Currently  . Sexual activity: Yes    Birth control/protection: Post-menopausal  Other Topics Concern  . Not on file  Social History Narrative  Patient is a retired Naval architect.  She is a widower and notes that she moved from the [place] area after being reunited with a high school sweetheart whom she is now engaged to.   She lives locally in [place].   No children.   Social Determinants of Health   Financial Resource Strain:   . Difficulty of Paying Living Expenses:   Food Insecurity:   . Worried About Charity fundraiser in the Last Year:   . Arboriculturist in the Last Year:   Transportation Needs:   . Film/video editor (Medical):   Marland Kitchen Lack  of Transportation (Non-Medical):   Physical Activity:   . Days of Exercise per Week:   . Minutes of Exercise per Session:   Stress:   . Feeling of Stress :   Social Connections:   . Frequency of Communication with Friends and Family:   . Frequency of Social Gatherings with Friends and Family:   . Attends Religious Services:   . Active Member of Clubs or Organizations:   . Attends Archivist Meetings:   Marland Kitchen Marital Status:   Intimate Partner Violence:   . Fear of Current or Ex-Partner:   . Emotionally Abused:   Marland Kitchen Physically Abused:   . Sexually Abused:    Family History  Problem Relation Age of Onset  . Diabetes Mother   . Heart disease Mother   . Depression Mother   . Lung cancer Father   . Hypertension Sister   . Hypertension Sister   . Thyroid disease Maternal Aunt     Objective: Office vital signs reviewed. BP (!) 145/87   Pulse 92   Temp 97.8 F (36.6 C)   Ht '5\' 5"'$  (1.651 m)   Wt 173 lb 6.4 oz (78.7 kg)   SpO2 97%   BMI 28.86 kg/m   Physical Examination:  General: Awake, alert, well nourished, No acute distress HEENT: Normal, sclera white, no exophthalmos.  No goiter.  TMs intact bilaterally with no appreciable effusions or abnormalities.  No carotid bruits Cardio: regular rate and rhythm, S1S2 heard, no murmurs appreciated Pulm: clear to auscultation bilaterally, no wheezes, rhonchi or rales; normal work of breathing on room air Extremities: warm, well perfused, No edema, cyanosis or clubbing; +2 pulses bilaterally MSK: Ambulating independently.  No soft tissue swelling noted along the right lateral ankle.  She has tenderness to palpation along the posterior left ankle at the insertion of the Achilles tendon. Skin: Normal temperature Neuro: No tremor.  Alert and oriented x3.  No focal neurologic deficits. Depression screen [facility] 2/9 10/19/2019 09/30/2019 03/31/2019  Decreased Interest 0 0 0  Down, Depressed, Hopeless 0 0 0  PHQ - 2 Score 0 0 0  Altered  sleeping - - 0  Tired, decreased energy - - 0  Change in appetite - - 0  Feeling bad or failure about yourself  - - 0  Trouble concentrating - - 0  Moving slowly or fidgety/restless - - 0  Suicidal thoughts - - 0  PHQ-9 Score - - 0   Assessment/ Plan: 64 y.o. female   1. Essential hypertension Not controlled.  Add Norvasc 5 mg daily.  She is to monitor her blood pressures daily and report back to me in the next couple of weeks.  She is going up to [place] since she is not able to come in for a check here in the office.  I have encouraged her to do physical activity, water  activities may be beneficial given foot and heel pain.  I have also reinforced need for Mediterranean diet to help with cardiovascular health.  I have placed referral to cardiology as below given atypical chest pain.  Personal view of the EKG did not show any evidence of previous ischemic events.  However, given symptomology may benefit from stress testing. - amLODipine (NORVASC) 5 MG tablet; Take 1 tablet (5 mg total) by mouth daily.  Dispense: 90 tablet; Refill: 0 - CMP14+EGFR; Future - Lipid Panel; Future  2. Acquired hypothyroidism Fatigue.  Check thyroid panel - CMP14+EGFR; Future - Thyroid Panel With TSH; Future  3. Need for pneumococcal vaccination Administered - Pneumococcal conjugate vaccine 13-valent  4. Atypical chest pain As above.  Referral placed. - EKG 12-Lead - CMP14+EGFR; Future - Lipid Panel; Future - CBC; Future - Ambulatory referral to Cardiology  5. Screening for HIV (human immunodeficiency virus) - HIV antibody (with reflex); Future  6. Tinnitus of both ears Likely due to uncontrolled blood pressures but we discussed that if symptoms do not improve with blood pressure control may need to consider eval by ENT.  She was agreeable to this plan  7. Chronic heel pain, left Referral to sports medicine placed.  Wonder if she would benefit from nitrous patch therapy. - Ambulatory referral to  Sports Medicine  8. Acute right ankle pain Likely secondary to above. - Ambulatory referral to Sports Medicine  9. Fatigue, unspecified type Uncertain etiology but may be secondary to tinnitus and poor sleep as above.  I will place lab eval.  Does not quite fit the body habitus for an obstructive sleep apnea but this should be considered. - CMP14+EGFR; Future - Thyroid Panel With TSH; Future - CBC; Future - Vitamin B12; Future - Ferritin; Future    No orders of the defined types were placed in this encounter.  No orders of the defined types were placed in this encounter.    Courtney Norlander, DO [place] [phone]

## 2019-10-19 NOTE — Patient Instructions (Addendum)
You had labs performed today.  You will be contacted with the results of the labs once they are available, usually in the next 3 business days for routine lab work.  If you have an active my chart account, they will be released to your MyChart.  If you prefer to have these labs released to you via telephone, please let us know.  If you had a pap smear or biopsy performed, expect to be contacted in about 7-10 days.  Amlodipine 5mg  daily ordered.  Monitor BPs daily and contact me in 2 weeks with readings.  Hopefully the ringing in your ears will improve with blood pressure control.  You had an EKG done today.  I am referring you to a cardiologist for stress testing as well.   Tinnitus Tinnitus refers to hearing a sound when there is no actual source for that sound. This is often described as ringing in the ears. However, people with this condition may hear a variety of noises, in one ear or in both ears. The sounds of tinnitus can be soft, loud, or somewhere in between. Tinnitus can last for a few seconds or can be constant for days. It may go away without treatment and come back at various times. When tinnitus is constant or happens often, it can lead to other problems, such as trouble sleeping and trouble concentrating. Almost everyone experiences tinnitus at some point. Tinnitus that is long-lasting (chronic) or comes back often (recurs) may require medical attention. What are the causes? The cause of tinnitus is often not known. In some cases, it can result from:  Exposure to loud noises from machinery, music, or other sources.  An object (foreign body) stuck in the ear.  Earwax buildup.  Drinking alcohol or caffeine.  Taking certain medicines.  Age-related hearing loss. It may also be caused by medical conditions such as:  Ear or sinus infections.  High blood pressure.  Heart diseases.  Anemia.  Allergies.  Meniere's disease.  Thyroid problems.  Tumors.  A weak,  bulging blood vessel (aneurysm) near the ear. What are the signs or symptoms? The main symptom of tinnitus is hearing a sound when there is no source for that sound. It may sound like:  Buzzing.  Roaring.  Ringing.  Blowing air.  Hissing.  Whistling.  Sizzling.  Humming.  Running water.  A musical note.  Tapping. Symptoms may affect only one ear (unilateral) or both ears (bilateral). How is this diagnosed? Tinnitus is diagnosed based on your symptoms, your medical history, and a physical exam. Your health care provider may do a thorough hearing test (audiologic exam) if your tinnitus:  Is unilateral.  Causes hearing difficulties.  Lasts 6 months or longer. You may work with a health care provider who specializes in hearing disorders (audiologist). You may be asked questions about your symptoms and how they affect your daily life. You may have other tests done, such as:  CT scan.  MRI.  An imaging test of how blood flows through your blood vessels (angiogram). How is this treated? Treating an underlying medical condition can sometimes make tinnitus go away. If your tinnitus continues, other treatments may include:  Medicines.  Therapy and counseling to help you manage the stress of living with tinnitus.  Sound generators to mask the tinnitus. These include: ? Tabletop sound machines that play relaxing sounds to help you fall asleep. ? Wearable devices that fit in your ear and play sounds or music. ? Acoustic neural stimulation. This involves  using headphones to listen to music that contains an auditory signal. Over time, listening to this signal may change some pathways in your brain and make you less sensitive to tinnitus. This treatment is used for very severe cases when no other treatment is working.  Using hearing aids or cochlear implants if your tinnitus is related to hearing loss. Hearing aids are worn in the outer ear. Cochlear implants are surgically  placed in the inner ear. Follow these instructions at home: Managing symptoms      When possible, avoid being in loud places and being exposed to loud sounds.  Wear hearing protection, such as earplugs, when you are exposed to loud noises.  Use a white noise machine, a humidifier, or other devices to mask the sound of tinnitus.  Practice techniques for reducing stress, such as meditation, yoga, or deep breathing. Work with your health care provider if you need help with managing stress.  Sleep with your head slightly raised. This may reduce the impact of tinnitus. General instructions  Do not use stimulants, such as nicotine, alcohol, or caffeine. Talk with your health care provider about other stimulants to avoid. Stimulants are substances that can make you feel alert and attentive by increasing certain activities in the body (such as heart rate and blood pressure). These substances may make tinnitus worse.  Take over-the-counter and prescription medicines only as told by your health care provider.  Try to get plenty of sleep each night.  Keep all follow-up visits as told by your health care provider. This is important. Contact a health care provider if:  Your tinnitus continues for 3 weeks or longer without stopping.  You develop sudden hearing loss.  Your symptoms get worse or do not get better with home care.  You feel you are not able to manage the stress of living with tinnitus. Get help right away if:  You develop tinnitus after a head injury.  You have tinnitus along with any of the following: ? Dizziness. ? Loss of balance. ? Nausea and vomiting. ? Sudden, severe headache. These symptoms may represent a serious problem that is an emergency. Do not wait to see if the symptoms will go away. Get medical help right away. Call your local emergency services (911 in the [place] Do not drive yourself to the hospital. Summary  Tinnitus refers to hearing a sound when there  is no actual source for that sound. This is often described as ringing in the ears.  Symptoms may affect only one ear (unilateral) or both ears (bilateral).  Use a white noise machine, a humidifier, or other devices to mask the sound of tinnitus.  Do not use stimulants, such as nicotine, alcohol, or caffeine. Talk with your health care provider about other stimulants to avoid. These substances may make tinnitus worse. This information is not intended to replace advice given to you by your health care provider. Make sure you discuss any questions you have with your health care provider. Document Revised: 10/22/2018 Document Reviewed: 01/17/2017 Elsevier Patient Education  2020 [employer].

## 2019-10-20 ENCOUNTER — Other Ambulatory Visit: Payer: 59

## 2019-10-20 DIAGNOSIS — I10 Essential (primary) hypertension: Secondary | ICD-10-CM

## 2019-10-20 DIAGNOSIS — R0789 Other chest pain: Secondary | ICD-10-CM

## 2019-10-20 DIAGNOSIS — Z114 Encounter for screening for human immunodeficiency virus [HIV]: Secondary | ICD-10-CM

## 2019-10-20 DIAGNOSIS — E039 Hypothyroidism, unspecified: Secondary | ICD-10-CM

## 2019-10-20 DIAGNOSIS — R5383 Other fatigue: Secondary | ICD-10-CM

## 2019-10-21 ENCOUNTER — Other Ambulatory Visit: Payer: Self-pay | Admitting: Family Medicine

## 2019-10-21 LAB — THYROID PANEL WITH TSH
Free Thyroxine Index: 1.8 (ref 1.2–4.9)
T3 Uptake Ratio: 25 % (ref 24–39)
T4, Total: 7.3 ug/dL (ref 4.5–12.0)
TSH: 8.56 u[IU]/mL — ABNORMAL HIGH (ref 0.450–4.500)

## 2019-10-21 LAB — CMP14+EGFR
ALT: 17 IU/L (ref 0–32)
AST: 13 IU/L (ref 0–40)
Albumin/Globulin Ratio: 2 (ref 1.2–2.2)
Albumin: 4.5 g/dL (ref 3.8–4.8)
Alkaline Phosphatase: 85 IU/L (ref 48–121)
BUN/Creatinine Ratio: 24 (ref 12–28)
BUN: 16 mg/dL (ref 8–27)
Bilirubin Total: 0.4 mg/dL (ref 0.0–1.2)
CO2: 22 mmol/L (ref 20–29)
Calcium: 9.8 mg/dL (ref 8.7–10.3)
Chloride: 103 mmol/L (ref 96–106)
Creatinine, Ser: 0.66 mg/dL (ref 0.57–1.00)
GFR calc Af Amer: 107 mL/min/{1.73_m2} (ref >59)
GFR calc non Af Amer: 93 mL/min/{1.73_m2} (ref >59)
Globulin, Total: 2.3 g/dL (ref 1.5–4.5)
Glucose: 94 mg/dL (ref 65–99)
Potassium: 4.4 mmol/L (ref 3.5–5.2)
Sodium: 140 mmol/L (ref 134–144)
Total Protein: 6.8 g/dL (ref 6.0–8.5)

## 2019-10-21 LAB — LIPID PANEL
Chol/HDL Ratio: 4.1 ratio (ref 0.0–4.4)
Cholesterol, Total: 210 mg/dL — ABNORMAL HIGH (ref 100–199)
HDL: 51 mg/dL (ref >39)
LDL Chol Calc (NIH): 135 mg/dL — ABNORMAL HIGH (ref 0–99)
Triglycerides: 134 mg/dL (ref 0–149)
VLDL Cholesterol Cal: 24 mg/dL (ref 5–40)

## 2019-10-21 LAB — CBC
Hematocrit: 38.5 % (ref 34.0–46.6)
Hemoglobin: 13.1 g/dL (ref 11.1–15.9)
MCH: 30.3 pg (ref 26.6–33.0)
MCHC: 34 g/dL (ref 31.5–35.7)
MCV: 89 fL (ref 79–97)
Platelets: 281 10*3/uL (ref 150–450)
RBC: 4.32 x10E6/uL (ref 3.77–5.28)
RDW: 12.5 % (ref 11.7–15.4)
WBC: 10.4 10*3/uL (ref 3.4–10.8)

## 2019-10-21 LAB — FERRITIN: Ferritin: 77 ng/mL (ref 15–150)

## 2019-10-21 LAB — VITAMIN B12: Vitamin B-12: 877 pg/mL (ref 232–1245)

## 2019-10-21 LAB — HIV ANTIBODY (ROUTINE TESTING W REFLEX): HIV Screen 4th Generation wRfx: NONREACTIVE

## 2019-10-21 MED ORDER — LEVOTHYROXINE SODIUM 88 MCG PO TABS: 88.0000 ug | ORAL_TABLET | Freq: Every day | ORAL | 0 refills | Status: DC

## 2019-10-22 ENCOUNTER — Ambulatory Visit: Payer: Self-pay

## 2019-10-22 ENCOUNTER — Other Ambulatory Visit: Payer: Self-pay

## 2019-10-22 ENCOUNTER — Ambulatory Visit (INDEPENDENT_AMBULATORY_CARE_PROVIDER_SITE_OTHER): Payer: Medicare Other | Admitting: Family Medicine

## 2019-10-22 ENCOUNTER — Encounter: Payer: Self-pay | Admitting: Family Medicine

## 2019-10-22 VITALS — BP 155/92 | HR 81 | Ht 65.0 in | Wt 172.0 lb

## 2019-10-22 DIAGNOSIS — M779 Enthesopathy, unspecified: Secondary | ICD-10-CM | POA: Insufficient documentation

## 2019-10-22 DIAGNOSIS — M25571 Pain in right ankle and joints of right foot: Secondary | ICD-10-CM | POA: Insufficient documentation

## 2019-10-22 DIAGNOSIS — M7660 Achilles tendinitis, unspecified leg: Secondary | ICD-10-CM

## 2019-10-22 NOTE — Progress Notes (Signed)
Courtney White - 64 y.o. female MRN [id number]  Date of birth: 05/18/1955  SUBJECTIVE:  Including CC & ROS.  Chief Complaint  Patient presents with  . Ankle Pain    right    Courtney White is a 64 y.o. female that is she is presenting with acute on chronic left heel pain as well as right ankle pain.  The heel pain has been ongoing for about 3 months.  She has received a steroid injection as well as an oral course of steroids.  The pain is still ongoing.  It is relieved with dorsiflexion.  Denies any injury or inciting event.  She feels unstable on the right ankle.  Denies any history of injury or surgeries   Review of Systems See HPI   HISTORY: Past Medical, Surgical, Social, and Family History Reviewed & Updated per EMR.   Pertinent Historical Findings include:  Past Medical History:  Diagnosis Date  . Hepatitis C 1992   s/p treatment.  she has cleared virus  . Thyroid disease   . Vitiligo     No past surgical history on file.  Family History  Problem Relation Age of Onset  . Diabetes Mother   . Heart disease Mother   . Depression Mother   . Lung cancer Father   . Hypertension Sister   . Hypertension Sister   . Thyroid disease Maternal Aunt     Social History   Socioeconomic History  . Marital status: Significant Other    Spouse name: Not on file  . Number of children: 0  . Years of education: Not on file  . Highest education level: Not on file  Occupational History  . Not on file  Tobacco Use  . Smoking status: Never Smoker  . Smokeless tobacco: Never Used  Vaping Use  . Vaping Use: Never used  Substance and Sexual Activity  . Alcohol use: Not on file    Comment: occ  . Drug use: Not Currently  . Sexual activity: Yes    Birth control/protection: Post-menopausal  Other Topics Concern  . Not on file  Social History Narrative   Patient is a retired Naval architect.  She is a widower and notes that she moved from the [place] area after being reunited  with a high school sweetheart whom she is now engaged to.   She lives locally in [place].   No children.   Social Determinants of Health   Financial Resource Strain:   . Difficulty of Paying Living Expenses:   Food Insecurity:   . Worried About Charity fundraiser in the Last Year:   . Arboriculturist in the Last Year:   Transportation Needs:   . Film/video editor (Medical):   Marland Kitchen Lack of Transportation (Non-Medical):   Physical Activity:   . Days of Exercise per Week:   . Minutes of Exercise per Session:   Stress:   . Feeling of Stress :   Social Connections:   . Frequency of Communication with Friends and Family:   . Frequency of Social Gatherings with Friends and Family:   . Attends Religious Services:   . Active Member of Clubs or Organizations:   . Attends Archivist Meetings:   Marland Kitchen Marital Status:   Intimate Partner Violence:   . Fear of Current or Ex-Partner:   . Emotionally Abused:   Marland Kitchen Physically Abused:   . Sexually Abused:      PHYSICAL EXAM:  VS: BP (!) 155/92  Pulse 81   Ht 5\' 5"  (1.651 m)   Wt 172 lb (78 kg)   BMI 28.62 kg/m  Physical Exam Gen: NAD, alert, cooperative with exam, well-appearing MSK:  Left heel: Mild raised red area at the posterior aspect of the calcaneus. Normal ankle range of motion. Normal strength resistance. No instability with 1 leg standing on the right or left. Neurovascularly intact  Limited ultrasound: Left heel, right ankle:  Left heel: Normal-appearing Achilles. Mild calcaneal spur with no hyperemia. There is mild hyperemia associated over the area of enlargement to suggest enthesitis  Right ankle: Normal-appearing posterior tibialis. No changes at the insertion of the posterior tibialis.   Summary: Left heel with changes consistent with enthesitis.  No structural changes of the right ankle  Ultrasound and interpretation by Clearance Coots, MD    ASSESSMENT & PLAN:   Enthesitis It appears  much more associated with the matrix at the Achilles insertion as opposed to the Achilles itself. -Counseled on home exercise therapy and supportive care. -Counseled on cushioning. -Heel lifts. -Provided Pennsaid samples. -Could consider lab testing if no improvement.  Acute right ankle pain She has a fairly cavus foot.  This may be related to some of the pain that she is experiencing.  Try to transition to different shoes to see if that makes a difference. -Counseled on home exercise therapy and supportive care. -Could consider physical therapy.

## 2019-10-22 NOTE — Patient Instructions (Signed)
Nice to meet you Please try the pennsaid  Please try ice on the area  Please try to float the area with the padding  Please try the heel lifts  Please try the exercises   Please send me a message in MyChart with any questions or updates.  Please see me back in 4 weeks .   --Dr. Raeford Razor

## 2019-10-22 NOTE — Progress Notes (Signed)
Medication Samples have been provided to the patient.  Drug name: Pennsaid       Strength: 2%        Qty: 2 boxes  LOT: [id number]  Exp.Date: 05/2020  Dosing instructions: use a pea size amount and rub gently.  The patient has been instructed regarding the correct time, dose, and frequency of taking this medication, including desired effects and most common side effects.   Sherrie George, MA 2:26 PM 10/22/2019

## 2019-10-22 NOTE — Assessment & Plan Note (Signed)
She has a fairly cavus foot.  This may be related to some of the pain that she is experiencing.  Try to transition to different shoes to see if that makes a difference. -Counseled on home exercise therapy and supportive care. -Could consider physical therapy.

## 2019-10-22 NOTE — Assessment & Plan Note (Signed)
It appears much more associated with the matrix at the Achilles insertion as opposed to the Achilles itself. -Counseled on home exercise therapy and supportive care. -Counseled on cushioning. -Heel lifts. -Provided Pennsaid samples. -Could consider lab testing if no improvement.

## 2019-11-24 ENCOUNTER — Ambulatory Visit: Payer: Medicare Other | Admitting: Family Medicine

## 2019-11-24 DIAGNOSIS — Z7189 Other specified counseling: Secondary | ICD-10-CM | POA: Insufficient documentation

## 2019-11-24 DIAGNOSIS — R072 Precordial pain: Secondary | ICD-10-CM | POA: Insufficient documentation

## 2019-11-24 NOTE — Progress Notes (Signed)
Cardiology Office Note   Date:  11/25/2019   ID:  Courtney White, DOB 1955-06-01, MRN [id number]  PCP:  [name], DO  Cardiologist:   No primary care provider on file. Referring:  [name], DO  Chief Complaint  Patient presents with  . Chest Pain      History of Present Illness: Courtney White is a 64 y.o. female who is referred by [name], DO for evaluation of chest pain.  The patient has had no past cardiac history.  Has been having chest discomfort that she started in May.  This was after she got some steroid injections and took some steroids.  She wonders if this could be related.  She describes a chest tightness.  It is mid chest.  It 2 out of 10 and lasts for only a few seconds.  It happens sporadically.  It is at rest.  She cannot bring it on with activities though she is not particularly active.  She does go grocery shopping and brings in the groceries and this does not bring it on.  She is now little shortness of breath with walking as well.  This is been slowly progressive.  She is not describing PND or orthopnea.  She has some rare palpitations but no presyncope or syncope.  She has had some left arm tightness and tingling as well.  This may or may not be associated with the chest discomfort.  She has never had any cardiac testing or prior cardiac work-up.  Past Medical History:  Diagnosis Date  . Hepatitis C 1992   s/p treatment.  she has cleared virus  . HTN (hypertension)   . Thyroid disease   . Vitiligo     Past Surgical History:  Procedure Laterality Date  . TONSILLECTOMY AND ADENOIDECTOMY    . UTERINE FIBROID SURGERY       Current Outpatient Medications  Medication Sig Dispense Refill  . amLODipine (NORVASC) 5 MG tablet Take 1 tablet (5 mg total) by mouth daily. 90 tablet 0  . cholecalciferol (VITAMIN D3) 25 MCG (1000 UNIT) tablet Take 1,000 Units by mouth daily.    . Ferrous Sulfate (IRON PO) Take by mouth.    .  levothyroxine (SYNTHROID) 88 MCG tablet Take 1 tablet (88 mcg total) by mouth daily. 90 tablet 0   No current facility-administered medications for this visit.    Allergies:   Patient has no known allergies.    Social History:  The patient  reports that she has never smoked. She has never used smokeless tobacco. She reports previous drug use.   Family History:  The patient's family history includes Depression in her mother; Diabetes in her mother; Hypertension in her mother, sister, and sister; Lung cancer in her father; Thyroid disease in her maternal aunt.    ROS:  Please see the history of present illness.   Otherwise, review of systems are positive for heel spurs.   All other systems are reviewed and negative.    PHYSICAL EXAM: VS:  BP 131/88   Pulse 94   Ht 5\' 5"  (1.651 m)   Wt 172 lb (78 kg)   SpO2 96%   BMI 28.62 kg/m  , BMI Body mass index is 28.62 kg/m. GENERAL:  Well appearing HEENT:  Pupils equal round and reactive, fundi not visualized, oral mucosa unremarkable NECK:  No jugular venous distention, waveform within normal limits, carotid upstroke brisk and symmetric, no bruits, no thyromegaly LYMPHATICS:  No cervical,  inguinal adenopathy LUNGS:  Clear to auscultation bilaterally BACK:  No CVA tenderness CHEST:  Unremarkable HEART:  PMI not displaced or sustained,S1 and S2 within normal limits, no S3, no S4, no clicks, no rubs, no murmurs ABD:  Flat, positive bowel sounds normal in frequency in pitch, no bruits, no rebound, no guarding, no midline pulsatile mass, no hepatomegaly, no splenomegaly EXT:  2 plus pulses throughout, no edema, no cyanosis no clubbing SKIN:  No rashes no nodules NEURO:  Cranial nerves II through XII grossly intact, motor grossly intact throughout PSYCH:  Cognitively intact, oriented to person place and time    EKG:  EKG is not ordered today. The ekg ordered 10/19/2019 demonstrates normal sinus rhythm, rate 83, axis within normal limits,  intervals within normal limits, no acute ST-T wave changes.   Recent Labs: 10/20/2019: ALT 17; BUN 16; Creatinine, Ser 0.66; Hemoglobin 13.1; Platelets 281; Potassium 4.4; Sodium 140; TSH 8.560    Lipid Panel    Component Value Date/Time   CHOL 210 (H) 10/20/2019 0906   TRIG 134 10/20/2019 0906   HDL 51 10/20/2019 0906   CHOLHDL 4.1 10/20/2019 0906   LDLCALC 135 (H) 10/20/2019 0906      Wt Readings from Last 3 Encounters:  11/25/19 172 lb (78 kg)  10/22/19 172 lb (78 kg)  10/19/19 173 lb 6.4 oz (78.7 kg)      Other studies Reviewed: Additional studies/ records that were reviewed today include: Labs. Review of the above records demonstrates:  Please see elsewhere in the note.     ASSESSMENT AND PLAN:  CHEST PAIN:    Her chest pain is somewhat atypical.  She does have hypertension as a risk factor.  She also has some dyslipidemia. I will bring the patient back for a POET (Plain Old Exercise Test). This will allow me to screen for obstructive coronary disease, risk stratify and very importantly provide a prescription for exercise.  DYSLIPIDEMIA: Her LDL was 135.  We talked about the Mediterranean diet.  We talked about exercise.  If she does this she should get this repeated.  The goal being LDL less than 100.  COVID EDUCATION: She has been vaccinated.   Current medicines are reviewed at length with the patient today.  The patient does not have concerns regarding medicines.  The following changes have been made:  no change  Labs/ tests ordered today include:   Orders Placed This Encounter  Procedures  . EXERCISE TOLERANCE TEST (ETT)     Disposition:   FU with me as needed and as based on the results of the above testing.   Signed, Minus Breeding, MD  11/25/2019 3:18 PM    [facility]

## 2019-11-25 ENCOUNTER — Other Ambulatory Visit: Payer: Self-pay

## 2019-11-25 ENCOUNTER — Encounter: Payer: Self-pay | Admitting: Cardiology

## 2019-11-25 ENCOUNTER — Ambulatory Visit (INDEPENDENT_AMBULATORY_CARE_PROVIDER_SITE_OTHER): Payer: Medicare Other | Admitting: Cardiology

## 2019-11-25 VITALS — BP 131/88 | HR 94 | Ht 65.0 in | Wt 172.0 lb

## 2019-11-25 DIAGNOSIS — Z7189 Other specified counseling: Secondary | ICD-10-CM | POA: Diagnosis not present

## 2019-11-25 DIAGNOSIS — R072 Precordial pain: Secondary | ICD-10-CM

## 2019-11-25 NOTE — Patient Instructions (Signed)
Medication Instructions:  The current medical regimen is effective;  continue present plan and medications.  *If you need a refill on your cardiac medications before your next appointment, please call your pharmacy*  Testing/Procedures: Your physician has requested that you have an exercise tolerance test. This test will be completed at [facility] and you will be contacted to be scheduled.  You will need to have Covid screening prior to the stress test which is also completed at [facility].  This will be scheduled a few days before the stress test in order to receive the results in time for your test.  The day of your test, please check in at the Main Entrance of [facility].  Nothing to eat/drink 4 hours before.  You may take your normal medications this day.  Wear 2 piece comfortable clothing and walking shoes.  Follow-Up: At [facility], you and your health needs are our priority.  As part of our continuing mission to provide you with exceptional heart care, we have created designated Provider Care Teams.  These Care Teams include your primary Cardiologist (physician) and Advanced Practice Providers (APPs -  Physician Assistants and Nurse Practitioners) who all work together to provide you with the care you need, when you need it.  We recommend signing up for the patient portal called "MyChart".  Sign up information is provided on this After Visit Summary.  MyChart is used to connect with patients for Virtual Visits (Telemedicine).  Patients are able to view lab/test results, encounter notes, upcoming appointments, etc.  Non-urgent messages can be sent to your provider as well.   To learn more about what you can do with MyChart, go to [email or web address].    Follow up as needed with Dr Percival Spanish based on the results of the above testing.  Thank you for choosing [facility]!!

## 2019-12-01 ENCOUNTER — Telehealth: Payer: Self-pay | Admitting: Cardiology

## 2019-12-01 NOTE — Telephone Encounter (Signed)
Spoke with pt who does not want to have the GXT completed if she has to has COVID screen.  She also reports she is going out of town and will call back once she returns if discuss further.  I suggested Dr Percival Spanish may want to order a different type of stress test that does not require a Covid screen.

## 2019-12-01 NOTE — Telephone Encounter (Signed)
Patient was scheduled for a GXT on 12/07/2019. Patient states that she was not wanting to have the COVID test done. She states that she would like to discuss with Dr. Rosezella [place] nurse before test is rescheduled.

## 2019-12-02 NOTE — Telephone Encounter (Signed)
Will forward this information to Dr Percival Spanish for his knowledge.

## 2019-12-02 NOTE — Telephone Encounter (Signed)
The pretest probability was low so I don't think she needs a CT or Lexiscan Myoview unless the symptoms progress.  She can schedule a follow up with me to discuss.   I don't see any end to the pre procedure COVID testing anytime soon.

## 2019-12-07 ENCOUNTER — Encounter (HOSPITAL_COMMUNITY): Payer: Medicare Other

## 2019-12-10 NOTE — Telephone Encounter (Signed)
No testing needed at this time.  Will discuss with patient when she calls back.

## 2019-12-15 ENCOUNTER — Telehealth: Payer: Self-pay

## 2020-01-10 ENCOUNTER — Other Ambulatory Visit: Payer: Self-pay | Admitting: Family Medicine

## 2020-01-10 DIAGNOSIS — I10 Essential (primary) hypertension: Secondary | ICD-10-CM

## 2020-01-13 ENCOUNTER — Other Ambulatory Visit: Payer: Self-pay | Admitting: Family Medicine

## 2020-01-25 ENCOUNTER — Ambulatory Visit: Payer: 59 | Admitting: Family Medicine

## 2020-02-01 ENCOUNTER — Ambulatory Visit: Payer: 59 | Admitting: Family Medicine

## 2020-02-01 NOTE — Progress Notes (Deleted)
Subjective: CC: Follow-up hypertension, hypothyroidism PCP: Courtney Norlander, DO SJG:GEZMOQ Courtney White is a 64 y.o. female presenting to clinic today for:  1. Hypertension/atypical chest pain Norvasc 5mg  daily added last visit.  She saw Dr. Percival White for the atypical chest pain.  There are plans for stress testing but apparently patient had to have Covid testing prior to stress test and she did not want to have a Covid test done.  According to Dr. Rosezella White notes, pretest probability of ischemic changes was low and therefore no additional testing was pursued. ***  2. Hypothyroidism/fatigue Patient reports compliance with Synthroid 88 mcg daily, increased last visit for TSH >8.  ***   ROS: Per HPI  No Known Allergies Past Medical History:  Diagnosis Date  . Hepatitis C 1992   s/p treatment.  she has cleared virus  . HTN (hypertension)   . Thyroid disease   . Vitiligo     Current Outpatient Medications:  .  amLODipine (NORVASC) 5 MG tablet, TAKE 1 TABLET BY MOUTH EVERY DAY, Disp: 90 tablet, Rfl: 0 .  cholecalciferol (VITAMIN D3) 25 MCG (1000 UNIT) tablet, Take 1,000 Units by mouth daily., Disp: , Rfl:  .  Ferrous Sulfate (IRON PO), Take by mouth., Disp: , Rfl:  .  levothyroxine (SYNTHROID) 88 MCG tablet, TAKE 1 TABLET BY MOUTH EVERY DAY, Disp: 90 tablet, Rfl: 0 Social History   Socioeconomic History  . Marital status: Significant Other    Spouse name: Not on file  . Number of children: 0  . Years of education: Not on file  . Highest education level: Not on file  Occupational History  . Not on file  Tobacco Use  . Smoking status: Never Smoker  . Smokeless tobacco: Never Used  Vaping Use  . Vaping Use: Never used  Substance and Sexual Activity  . Alcohol use: Not on file    Comment: occ  . Drug use: Not Currently  . Sexual activity: Yes    Birth control/protection: Post-menopausal  Other Topics Concern  . Not on file  Social History Narrative   Patient is a  retired Naval architect.  She is a widower and notes that she moved from the [place] area after being reunited with a Courtney school sweetheart whom she is now engaged to.   She lives locally in [place].   No children.   Social Determinants of Health   Financial Resource Strain:   . Difficulty of Paying Living Expenses: Not on file  Food Insecurity:   . Worried About Charity fundraiser in the Last Year: Not on file  . Ran Out of Food in the Last Year: Not on file  Transportation Needs:   . Lack of Transportation (Medical): Not on file  . Lack of Transportation (Non-Medical): Not on file  Physical Activity:   . Days of Exercise per Week: Not on file  . Minutes of Exercise per Session: Not on file  Stress:   . Feeling of Stress : Not on file  Social Connections:   . Frequency of Communication with Friends and Family: Not on file  . Frequency of Social Gatherings with Friends and Family: Not on file  . Attends Religious Services: Not on file  . Active Member of Clubs or Organizations: Not on file  . Attends Archivist Meetings: Not on file  . Marital Status: Not on file  Intimate Partner Violence:   . Fear of Current or Ex-Partner: Not on file  . Emotionally Abused:  Not on file  . Physically Abused: Not on file  . Sexually Abused: Not on file   Family History  Problem Relation Age of Onset  . Diabetes Mother   . Depression Mother   . Hypertension Mother   . Lung cancer Father   . Hypertension Sister   . Hypertension Sister   . Thyroid disease Maternal Aunt     Objective: Office vital signs reviewed. There were no vitals taken for this visit.  Physical Examination:  General: Awake, alert, well nourished, No acute distress HEENT: Normal, sclera white, no exophthalmos.  No goiter.  TMs intact bilaterally with no appreciable effusions or abnormalities.  No carotid bruits Cardio: regular rate and rhythm, S1S2 heard, no murmurs appreciated Pulm: clear to  auscultation bilaterally, no wheezes, rhonchi or rales; normal work of breathing on room air Extremities: warm, well perfused, No edema, cyanosis or clubbing; +2 pulses bilaterally MSK: Ambulating independently.  No soft tissue swelling noted along the right lateral ankle.  She has tenderness to palpation along the posterior left ankle at the insertion of the Achilles tendon. Skin: Normal temperature Neuro: No tremor.  Alert and oriented x3.  No focal neurologic deficits. *** Depression screen [facility] 2/9 10/19/2019 09/30/2019 03/31/2019  Decreased Interest 0 0 0  Down, Depressed, Hopeless 0 0 0  PHQ - 2 Score 0 0 0  Altered sleeping - - 0  Tired, decreased energy - - 0  Change in appetite - - 0  Feeling bad or failure about yourself  - - 0  Trouble concentrating - - 0  Moving slowly or fidgety/restless - - 0  Suicidal thoughts - - 0  PHQ-9 Score - - 0   Assessment/ Plan: 64 y.o. female   ***   No orders of the defined types were placed in this encounter.  No orders of the defined types were placed in this encounter.    Courtney Norlander, DO [place] [phone]

## 2020-02-02 ENCOUNTER — Encounter: Payer: Self-pay | Admitting: Family Medicine

## 2020-04-18 ENCOUNTER — Encounter: Payer: Self-pay | Admitting: Family Medicine

## 2020-04-18 ENCOUNTER — Ambulatory Visit (INDEPENDENT_AMBULATORY_CARE_PROVIDER_SITE_OTHER): Payer: Medicare Other | Admitting: Family Medicine

## 2020-04-18 ENCOUNTER — Ambulatory Visit: Payer: Medicare Other | Admitting: Family Medicine

## 2020-04-18 DIAGNOSIS — E039 Hypothyroidism, unspecified: Secondary | ICD-10-CM | POA: Diagnosis not present

## 2020-04-18 NOTE — Patient Instructions (Signed)
Hypothyroidism  Hypothyroidism is when the thyroid gland does not make enough of certain hormones (it is underactive). The thyroid gland is a small gland located in the lower front part of the neck, just in front of the windpipe (trachea). This gland makes hormones that help control how the body uses food for energy (metabolism) as well as how the heart and brain function. These hormones also play a role in keeping your bones strong. When the thyroid is underactive, it produces too little of the hormones thyroxine (T4) and triiodothyronine (T3). What are the causes? This condition may be caused by:  Hashimoto's disease. This is a disease in which the body's disease-fighting system (immune system) attacks the thyroid gland. This is the most common cause.  Viral infections.  Pregnancy.  Certain medicines.  Birth defects.  Past radiation treatments to the head or neck for cancer.  Past treatment with radioactive iodine.  Past exposure to radiation in the environment.  Past surgical removal of part or all of the thyroid.  Problems with a gland in the center of the brain (pituitary gland).  Lack of enough iodine in the diet. What increases the risk? You are more likely to develop this condition if:  You are female.  You have a family history of thyroid conditions.  You use a medicine called lithium.  You take medicines that affect the immune system (immunosuppressants). What are the signs or symptoms? Symptoms of this condition include:  Feeling as though you have no energy (lethargy).  Not being able to tolerate cold.  Weight gain that is not explained by a change in diet or exercise habits.  Lack of appetite.  Dry skin.  Coarse hair.  Menstrual irregularity.  Slowing of thought processes.  Constipation.  Sadness or depression. How is this diagnosed? This condition may be diagnosed based on:  Your symptoms, your medical history, and a physical exam.  Blood  tests. You may also have imaging tests, such as an ultrasound or MRI. How is this treated? This condition is treated with medicine that replaces the thyroid hormones that your body does not make. After you begin treatment, it may take several weeks for symptoms to go away. Follow these instructions at home:  Take over-the-counter and prescription medicines only as told by your health care provider.  If you start taking any new medicines, tell your health care provider.  Keep all follow-up visits as told by your health care provider. This is important. ? As your condition improves, your dosage of thyroid hormone medicine may change. ? You will need to have blood tests regularly so that your health care provider can monitor your condition. Contact a health care provider if:  Your symptoms do not get better with treatment.  You are taking thyroid replacement medicine and you: ? Sweat a lot. ? Have tremors. ? Feel anxious. ? Lose weight rapidly. ? Cannot tolerate heat. ? Have emotional swings. ? Have diarrhea. ? Feel weak. Get help right away if you have:  Chest pain.  An irregular heartbeat.  A rapid heartbeat.  Difficulty breathing. Summary  Hypothyroidism is when the thyroid gland does not make enough of certain hormones (it is underactive).  When the thyroid is underactive, it produces too little of the hormones thyroxine (T4) and triiodothyronine (T3).  The most common cause is Hashimoto's disease, a disease in which the body's disease-fighting system (immune system) attacks the thyroid gland. The condition can also be caused by viral infections, medicine, pregnancy, or past   radiation treatment to the head or neck.  Symptoms may include weight gain, dry skin, constipation, feeling as though you do not have energy, and not being able to tolerate cold.  This condition is treated with medicine to replace the thyroid hormones that your body does not make. This information  is not intended to replace advice given to you by your health care provider. Make sure you discuss any questions you have with your health care provider. Document Revised: 03/22/2017 Document Reviewed: 03/20/2017 Elsevier Patient Education  2020 Elsevier Inc.  

## 2020-04-18 NOTE — Progress Notes (Signed)
   Virtual Visit via telephone Note Due to COVID-19 pandemic this visit was conducted virtually. This visit type was conducted due to national recommendations for restrictions regarding the COVID-19 Pandemic (e.g. social distancing, sheltering in place) in an effort to limit this patient's exposure and mitigate transmission in our community. All issues noted in this document were discussed and addressed.  A physical exam was not performed with this format.  I connected with Courtney White on 04/18/20 at 1047 by telephone and verified that I am speaking with the correct person using two identifiers. Courtney White is currently located at home and her husband is currently with her during the visit. The provider, Gwenlyn Perking, FNP is located in their office at time of visit.  I discussed the limitations, risks, security and privacy concerns of performing an evaluation and management service by telephone and the availability of in person appointments. I also discussed with the patient that there may be a patient responsible charge related to this service. The patient expressed understanding and agreed to proceed.  CC: thyroid problem  History and Present Illness:  HPI  Courtney White has a history of hypothyroidism. She takes levothyroxine 88 mcg daily. Her last thyroid panel was 6 months ago and her TSH was elevated. Her dosage was increased at that point from 75 mcg to 88 mcg. She has been out of state, so she has not had her tyroid panel repeated since. She now has a cough, so she is unable to come into the office for an in person visit. She is requesting to see an endocrinologist for her hypothyroidism. She reports hoarseness, a dry cough, weight gain, and what feels like "a pressure in her throat". Denies fatigue, changes in hair, skin, or nails. She reports that she would just feel better if she was able to see a specialist.    ROS Negative unless specially indicated above in  HPI.  Observations/Objective: Alert and oriented x 3. Able to speak in full sentences without difficulty.   Assessment and Plan: Courtney White was seen today for thyroid problem.  Diagnoses and all orders for this visit:  Acquired hypothyroidism Referral placed per patient request. Last TSH was elevated, levothyroxine dosage was increased. Patient has been seen to repeat thyroid panel since. She currently has a cough and is unable to come in for an in person visit. -     Ambulatory referral to Endocrinology  Follow Up Instructions: As needed, with PCP as scheduled.     I discussed the assessment and treatment plan with the patient. The patient was provided an opportunity to ask questions and all were answered. The patient agreed with the plan and demonstrated an understanding of the instructions.   The patient was advised to call back or seek an in-person evaluation if the symptoms worsen or if the condition fails to improve as anticipated.  The above assessment and management plan was discussed with the patient. The patient verbalized understanding of and has agreed to the management plan. Patient is aware to call the clinic if symptoms persist or worsen. Patient is aware when to return to the clinic for a follow-up visit. Patient educated on when it is appropriate to go to the emergency department.   Time call ended:  1055  I provided 15 minutes of non-face-to-face time during this encounter.    Gwenlyn Perking, FNP

## 2020-04-21 ENCOUNTER — Other Ambulatory Visit: Payer: Self-pay | Admitting: Family Medicine

## 2020-04-21 DIAGNOSIS — I10 Essential (primary) hypertension: Secondary | ICD-10-CM

## 2020-05-05 ENCOUNTER — Telehealth: Payer: Self-pay

## 2020-05-05 ENCOUNTER — Telehealth: Payer: Medicare Other | Admitting: Family

## 2020-05-05 DIAGNOSIS — I10 Essential (primary) hypertension: Secondary | ICD-10-CM

## 2020-05-05 DIAGNOSIS — N390 Urinary tract infection, site not specified: Secondary | ICD-10-CM | POA: Diagnosis not present

## 2020-05-05 MED ORDER — AMLODIPINE BESYLATE 5 MG PO TABS: 5.0000 mg | ORAL_TABLET | Freq: Every day | ORAL | 0 refills | Status: DC

## 2020-05-05 MED ORDER — SULFAMETHOXAZOLE-TRIMETHOPRIM 800-160 MG PO TABS: 1.0000 | ORAL_TABLET | Freq: Two times a day (BID) | ORAL | 0 refills | Status: DC

## 2020-05-05 MED ORDER — LEVOTHYROXINE SODIUM 88 MCG PO TABS: 88.0000 ug | ORAL_TABLET | Freq: Every day | ORAL | 0 refills | Status: DC

## 2020-05-05 NOTE — Telephone Encounter (Signed)
  Prescription Request  05/05/2020  What is the name of the medication or equipment? amLODipine (NORVASC) 5 MG tablet and levothyroxine (SYNTHROID) 88 MCG tablet   Have you contacted your pharmacy to request a refill? (if applicable) no--pt has apt 06/14/2020 at 1:30 w [place] would you like this sent to? [facility]. Pt is back from [facility]   Patient notified that their request is being sent to the clinical staff for review and that they should receive a response within 2 business days.

## 2020-05-05 NOTE — Telephone Encounter (Signed)
Rx sent- patient aware.  

## 2020-05-05 NOTE — Progress Notes (Signed)
We are sorry that you are not feeling well.  Here is how we plan to help!  Based on what you shared with me it looks like you most likely have a simple urinary tract infection.  A UTI (Urinary Tract Infection) is a bacterial infection of the bladder.  Most cases of urinary tract infections are simple to treat but a key part of your care is to encourage you to drink plenty of fluids and watch your symptoms carefully.  I have prescribed Bactrim DS One tablet twice a day for 5 days.  Your symptoms should gradually improve. Call us if the burning in your urine worsens, you develop worsening fever, back pain or pelvic pain or if your symptoms do not resolve after completing the antibiotic.  Urinary tract infections can be prevented by drinking plenty of water to keep your body hydrated.  Also be sure when you wipe, wipe from front to back and don't hold it in!  If possible, empty your bladder every 4 hours.  Your e-visit answers were reviewed by a board certified advanced clinical practitioner to complete your personal care plan.  Depending on the condition, your plan could have included both over the counter or prescription medications.  If there is a problem please reply  once you have received a response from your provider.  Your safety is important to us.  If you have drug allergies check your prescription carefully.    You can use MyChart to ask questions about today's visit, request a non-urgent call back, or ask for a work or school excuse for 24 hours related to this e-Visit. If it has been greater than 24 hours you will need to follow up with your provider, or enter a new e-Visit to address those concerns.   You will get an e-mail in the next two days asking about your experience.  I hope that your e-visit has been valuable and will speed your recovery. Thank you for using e-visits.  Greater than 5 minutes, yet less than 10 minutes of time have been spent researching, coordinating, and  implementing care for this patient.     

## 2020-06-08 ENCOUNTER — Ambulatory Visit: Payer: Medicare Other | Admitting: Endocrinology

## 2020-06-14 ENCOUNTER — Other Ambulatory Visit: Payer: Self-pay

## 2020-06-14 ENCOUNTER — Other Ambulatory Visit: Payer: Self-pay | Admitting: Family Medicine

## 2020-06-14 ENCOUNTER — Ambulatory Visit: Payer: Medicare Other | Admitting: Family Medicine

## 2020-06-14 VITALS — BP 138/87 | HR 80 | Temp 97.7°F | Ht 65.0 in | Wt 175.0 lb

## 2020-06-14 DIAGNOSIS — H9313 Tinnitus, bilateral: Secondary | ICD-10-CM

## 2020-06-14 DIAGNOSIS — M5481 Occipital neuralgia: Secondary | ICD-10-CM

## 2020-06-14 DIAGNOSIS — E049 Nontoxic goiter, unspecified: Secondary | ICD-10-CM

## 2020-06-14 DIAGNOSIS — E039 Hypothyroidism, unspecified: Secondary | ICD-10-CM

## 2020-06-14 DIAGNOSIS — I10 Essential (primary) hypertension: Secondary | ICD-10-CM

## 2020-06-14 DIAGNOSIS — R350 Frequency of micturition: Secondary | ICD-10-CM

## 2020-06-14 LAB — MICROSCOPIC EXAMINATION

## 2020-06-14 LAB — URINALYSIS, COMPLETE
Bilirubin, UA: NEGATIVE
Glucose, UA: NEGATIVE
Ketones, UA: NEGATIVE
Nitrite, UA: NEGATIVE
Protein,UA: NEGATIVE
RBC, UA: NEGATIVE
Specific Gravity, UA: 1.015 (ref 1.005–1.030)
Urobilinogen, Ur: 0.2 mg/dL (ref 0.2–1.0)
pH, UA: 5.5 (ref 5.0–7.5)

## 2020-06-14 MED ORDER — CHOLECALCIFEROL 1.25 MG (50000 UT) PO CAPS: 50000.0000 [IU] | ORAL_CAPSULE | ORAL | 3 refills | Status: DC

## 2020-06-14 MED ORDER — GABAPENTIN 100 MG PO CAPS: ORAL_CAPSULE | ORAL | 0 refills | Status: DC

## 2020-06-14 MED ORDER — AMLODIPINE BESYLATE 5 MG PO TABS: 5.0000 mg | ORAL_TABLET | Freq: Every day | ORAL | 3 refills | Status: DC

## 2020-06-14 MED ORDER — CEPHALEXIN 500 MG PO CAPS
500.0000 mg | ORAL_CAPSULE | Freq: Two times a day (BID) | ORAL | 0 refills | Status: AC
Start: 2020-06-14 — End: 2020-06-21

## 2020-06-14 NOTE — Progress Notes (Signed)
Subjective: CC: Hypothyroidism PCP: [name], DO Courtney White is a 65 y.o. female presenting to clinic today for:  1.  Hypothyroidism Patient does feel better on the increased dose of Synthroid.  We last saw each other in June 2021 and her TSH was noted to be elevated at that time.  We advance her Synthroid 88 mcg and she was to follow-up in 6 to 8 weeks post medication change but unfortunately she was lost to follow-up.  She continues to have intermittent episodes of diarrhea and is on a probiotic.  She does feel hoarse at times and feels that perhaps she is developing a goiter at the base of her neck.  She has difficulty losing weight.  No tremor.  No change in energy.  The sensation in her chest that she was experiencing has resolved since increase of the Synthroid and therefore she did not proceed with stress testing with cardiology.  She has an appointment to establish care with endocrinology in about a month or so.  2.  Hypertension Patient is compliant with Norvasc 5 mg daily.  No reports of chest pain or shortness of breath.  3.  Scalp pain/ tinnitus  Patient reports intermittent sharp scalp pains that occur on the right side of her scalp.  These do not radiate to her face.  She does not feel that they are especially painful but rather annoying.  She continues to have tinnitus bilaterally.  This is an every day occurring issue.  But again more of an annoyance.  4.  Urinary frequency Patient had a urinary tract infection several months ago.  She started having pelvic pressure, urgency, frequency and a foul odor to her urine over the last couple of days would like to get her urine checked out again.  No overt dysuria or hematuria  ROS: Per HPI  No Known Allergies Past Medical History:  Diagnosis Date  . Hepatitis C 1992   s/p treatment.  she has cleared virus  . HTN (hypertension)   . Thyroid disease   . Vitiligo     Current Outpatient Medications:  .   gabapentin (NEURONTIN) 100 MG capsule, Take 1 capsule (100 mg total) by mouth at bedtime for 7 days, THEN 2 capsules (200 mg total) at bedtime for 7 days, THEN 3 capsules (300 mg total) at bedtime for 24 days., Disp: 90 capsule, Rfl: 0 .  levothyroxine (SYNTHROID) 88 MCG tablet, Take 1 tablet (88 mcg total) by mouth daily., Disp: 30 tablet, Rfl: 0 .  amLODipine (NORVASC) 5 MG tablet, Take 1 tablet (5 mg total) by mouth daily., Disp: 90 tablet, Rfl: 3 Social History   Socioeconomic History  . Marital status: Significant Other    Spouse name: Not on file  . Number of children: 0  . Years of education: Not on file  . Highest education level: Not on file  Occupational History  . Not on file  Tobacco Use  . Smoking status: Never Smoker  . Smokeless tobacco: Never Used  Vaping Use  . Vaping Use: Never used  Substance and Sexual Activity  . Alcohol use: Not on file    Comment: occ  . Drug use: Not Currently  . Sexual activity: Yes    Birth control/protection: Post-menopausal  Other Topics Concern  . Not on file  Social History Narrative   Patient is a retired [employer] architect.  She is a widower and notes that she moved from the [place] area after being reunited with a  high school sweetheart whom she is now engaged to.   She lives locally in [place].   No children.   Social Determinants of Health   Financial Resource Strain: Not on file  Food Insecurity: Not on file  Transportation Needs: Not on file  Physical Activity: Not on file  Stress: Not on file  Social Connections: Not on file  Intimate Partner Violence: Not on file   Family History  Problem Relation Age of Onset  . Diabetes Mother   . Depression Mother   . Hypertension Mother   . Lung cancer Father   . Hypertension Sister   . Hypertension Sister   . Thyroid disease Maternal Aunt     Objective: Office vital signs reviewed. BP 138/87   Pulse 80   Temp 97.7 F (36.5 C) (Temporal)   Ht 5\' 5"  (1.651 m)   Wt  175 lb (79.4 kg)   SpO2 97%   BMI 29.12 kg/m   Physical Examination:  General: Awake, alert, well nourished, No acute distress HEENT: Normal; sclera white.  No exophthalmos.  There does appear to be a small goiter at the base of her neck Cardio: regular rate and rhythm, S1S2 heard, no murmurs appreciated Pulm: clear to auscultation bilaterally, no wheezes, rhonchi or rales; normal work of breathing on room air Extremities: warm, well perfused, No edema, cyanosis or clubbing; +2 pulses bilaterally MSK: normal gait and station Skin: dry; intact; no rashes or lesions; normal temperature Neuro: no tremor. No focal neurologic deficits  Assessment/ Plan: 65 y.o. female   Acquired hypothyroidism - Plan: Thyroid Panel With TSH  Essential hypertension - Plan: Basic Metabolic Panel, amLODipine (NORVASC) 5 MG tablet  Goiter  Tinnitus of both ears  Occipital neuralgia of right side - Plan: gabapentin (NEURONTIN) 100 MG capsule  Urinary frequency - Plan: Urinalysis, Complete  Definitely appears to have a goiter at the base of her neck today.  Check thyroid panel.  Agree with eval by endocrinology.  Discussed that they may want to pursue ultrasound of the thyroid  Blood pressures well controlled.  Norvasc renewed  Tinnitus is stable and chronic  I think what she is experiencing on the right side of her head is occipital neuralgia.  Trial of gabapentin.  Offered referral to neurology but she wanted to hold off on this today  Check urinalysis given reports of urinary frequency, odor and urgency.  We will plan for antibiotic pending this result  Orders Placed This Encounter  Procedures  . Thyroid Panel With TSH  . Basic Metabolic Panel  . Urinalysis, Complete   Meds ordered this encounter  Medications  . gabapentin (NEURONTIN) 100 MG capsule    Sig: Take 1 capsule (100 mg total) by mouth at bedtime for 7 days, THEN 2 capsules (200 mg total) at bedtime for 7 days, THEN 3 capsules (300  mg total) at bedtime for 24 days.    Dispense:  90 capsule    Refill:  0  . amLODipine (NORVASC) 5 MG tablet    Sig: Take 1 tablet (5 mg total) by mouth daily.    Dispense:  90 tablet    Refill:  [place], [place] [phone]

## 2020-06-14 NOTE — Patient Instructions (Addendum)
I think you have something call occipital neuralgia going on.  We discussed referral to neurology vs trial of medication.  Please increase the Gabapentin every week as directed IF you are having inadequate relief with the preceding dose.  If this medication does not help at all, please let me know and I will refer you to neurology  Keep appointment with Endocrinology.  They may consider ultrasound of your thyroid.  We will check a thyroid panel today.  Occipital Neuralgia  Occipital neuralgia is a type of headache that causes brief episodes of very bad pain in the back of the head. Pain from occipital neuralgia may spread (radiate) to other parts of the head. These headaches may be caused by irritation of the nerves that leave the spinal cord high up in the neck, just below the base of the skull (occipital nerves). The occipital nerves transmit sensations from the back of the head, the top of the head, and the areas behind the ears. What are the causes? This condition can occur without any known cause (primary headache syndrome). In other cases, this condition is caused by pressure on or irritation of one of the two occipital nerves. Pressure and irritation may be due to:  Muscle spasm in the neck.  Neck injury.  Wear and tear of the vertebrae in the neck (osteoarthritis).  Disease of the disks that separate the vertebrae.  Swollen blood vessels that put pressure on the occipital nerves.  Infections.  Tumors.  Diabetes. What are the signs or symptoms? This condition causes brief burning, stabbing, electric, shocking, or shooting pain in the back of the head that can radiate to the top of the head. It can happen on one side or both sides of the head. It can also cause:  Pain behind the eye.  Pain triggered by neck movement or hair brushing.  Scalp tenderness.  Aching in the back of the head between episodes of very bad pain.  Pain that gets worse with exposure to bright  lights. How is this diagnosed? Your health care provider may diagnose the condition based on a physical exam and your symptoms. Tests may be done, such as:  Imaging studies of the brain and neck (cervical spine), such as an MRI or CT scan. These look for causes of pinched nerves.  Applying pressure to the nerves in the neck to try to re-create the pain.  Injection of numbing medicine into the occipital nerve areas to see if pain goes away (diagnostic nerve block).   How is this treated? Treatment for this condition may begin with simple measures, such as:  Rest.  Massage.  Applying heat or cold to the area.  Over-the-counter pain relievers. If these measures do not work, you may need other treatments, including:  Medicines, such as: ? Prescription-strength anti-inflammatory medicines. ? Muscle relaxants. ? Anti-seizure medicines, which can relieve pain. ? Antidepressants, which can relieve pain. ? Injected medicines, such as medicines that numb the area (local anesthetic) and steroids.  Pulsed radiofrequency ablation. This is when wires are implanted to deliver electrical impulses that block pain signals from the occipital nerve.  Surgery to relieve nerve pressure.  Physical therapy. Follow these instructions at home: Managing pain  Avoid any activities that cause pain.  Rest when you have an attack of pain.  Try gentle massage to relieve pain.  Try a different pillow or sleeping position.  If directed, apply heat to the affected area as often as told by your health care provider.  Use the heat source that your health care provider recommends, such as a moist heat pack or a heating pad. ? Place a towel between your skin and the heat source. ? Leave the heat on for 20-30 minutes. ? Remove the heat if your skin turns bright red. This is especially important if you are unable to feel pain, heat, or cold. You have a greater risk of getting burned.  If directed, put ice on  the back of your head and neck area. To do this: ? Put ice in a plastic bag. ? Place a towel between your skin and the bag. ? Leave the ice on for 20 minutes, 2-3 times a day. ? Remove the ice if your skin turns bright red. This is very important. If you cannot feel pain, heat, or cold, you have a greater risk of damage to the area.      General instructions  Take over-the-counter and prescription medicines only as told by your health care provider.  Avoid things that make your symptoms worse, such as bright lights.  Try to stay active. Get regular exercise that does not cause pain. Ask your health care provider to suggest safe exercises for you.  Work with a physical therapist to learn stretching exercises you can do at home.  Practice good posture.  Keep all follow-up visits. This is important. Contact a health care provider if:  Your medicine is not working.  You have new or worsening symptoms. Get help right away if:  You have very bad head pain that does not go away.  You have a sudden change in vision, balance, or speech. These symptoms may represent a serious problem that is an emergency. Do not wait to see if the symptoms will go away. Get medical help right away. Call your local emergency services (911 in the [place] Do not drive yourself to the hospital. Summary  Occipital neuralgia is a type of headache that causes brief episodes of very bad pain in the back of the head.  Pain from occipital neuralgia may spread (radiate) to other parts of the head.  Treatment for this condition includes rest, massage, and medicines. This information is not intended to replace advice given to you by your health care provider. Make sure you discuss any questions you have with your health care provider. Document Revised: 02/07/2020 Document Reviewed: 02/07/2020 Elsevier Patient Education  2021 [employer].

## 2020-06-14 NOTE — Addendum Note (Signed)
Addended by: [name] on: 06/14/2020 02:41 PM   Modules accepted: Orders

## 2020-06-15 ENCOUNTER — Other Ambulatory Visit: Payer: Self-pay | Admitting: Family Medicine

## 2020-06-15 LAB — BASIC METABOLIC PANEL
BUN/Creatinine Ratio: 20 (ref 12–28)
BUN: 15 mg/dL (ref 8–27)
CO2: 22 mmol/L (ref 20–29)
Calcium: 10.5 mg/dL — ABNORMAL HIGH (ref 8.7–10.3)
Chloride: 100 mmol/L (ref 96–106)
Creatinine, Ser: 0.75 mg/dL (ref 0.57–1.00)
GFR calc Af Amer: 97 mL/min/{1.73_m2} (ref >59)
GFR calc non Af Amer: 84 mL/min/{1.73_m2} (ref >59)
Glucose: 86 mg/dL (ref 65–99)
Potassium: 4.4 mmol/L (ref 3.5–5.2)
Sodium: 138 mmol/L (ref 134–144)

## 2020-06-15 LAB — THYROID PANEL WITH TSH
Free Thyroxine Index: 2.3 (ref 1.2–4.9)
T3 Uptake Ratio: 26 % (ref 24–39)
T4, Total: 8.7 ug/dL (ref 4.5–12.0)
TSH: 6.12 u[IU]/mL — ABNORMAL HIGH (ref 0.450–4.500)

## 2020-06-15 MED ORDER — LEVOTHYROXINE SODIUM 100 MCG PO TABS: 100.0000 ug | ORAL_TABLET | Freq: Every day | ORAL | 3 refills | Status: DC

## 2020-07-11 ENCOUNTER — Encounter: Payer: Self-pay | Admitting: *Deleted

## 2020-07-13 ENCOUNTER — Other Ambulatory Visit: Payer: Self-pay

## 2020-07-13 ENCOUNTER — Ambulatory Visit (INDEPENDENT_AMBULATORY_CARE_PROVIDER_SITE_OTHER): Payer: Medicare Other | Admitting: Endocrinology

## 2020-07-13 VITALS — BP 180/90 | HR 89 | Ht 65.0 in | Wt 178.2 lb

## 2020-07-13 DIAGNOSIS — E039 Hypothyroidism, unspecified: Secondary | ICD-10-CM

## 2020-07-13 DIAGNOSIS — R519 Headache, unspecified: Secondary | ICD-10-CM | POA: Diagnosis not present

## 2020-07-13 DIAGNOSIS — R0600 Dyspnea, unspecified: Secondary | ICD-10-CM

## 2020-07-13 DIAGNOSIS — R0609 Other forms of dyspnea: Secondary | ICD-10-CM | POA: Insufficient documentation

## 2020-07-13 LAB — SEDIMENTATION RATE: Sed Rate: 31 mm/hr — ABNORMAL HIGH (ref 0–30)

## 2020-07-13 LAB — T4, FREE: Free T4: 1.09 ng/dL (ref 0.60–1.60)

## 2020-07-13 LAB — TSH: TSH: 5.21 u[IU]/mL — ABNORMAL HIGH (ref 0.35–4.50)

## 2020-07-13 MED ORDER — LEVOTHYROXINE SODIUM 125 MCG PO TABS: 125.0000 ug | ORAL_TABLET | Freq: Every day | ORAL | 3 refills | Status: DC

## 2020-07-13 NOTE — Progress Notes (Signed)
Subjective:    Patient ID: Courtney White, female    DOB: March 03, 1956, 65 y.o.   MRN: [id number]  HPI Pt is referred by Marjorie Smolder, NP, for hypothyroidism.  Pt reports hypothyroidism was dx'ed in 2002.  she has been on prescribed thyroid hormone therapy since dx.  she has never taken kelp or any other type of non-prescribed thyroid product.  she has never had thyroid imaging.  She has never had thyroid surgery, or XRT to the neck.  she has never been on amiodarone or lithium.  She has been on her current 100 mcg/d, x 1 month.  Pt reports swelling at the ant neck.   Past Medical History:  Diagnosis Date  . Hepatitis C 1992   s/p treatment.  she has cleared virus  . HTN (hypertension)   . Thyroid disease   . Vitiligo     Past Surgical History:  Procedure Laterality Date  . TONSILLECTOMY AND ADENOIDECTOMY    . UTERINE FIBROID SURGERY      Social History   Socioeconomic History  . Marital status: Significant Other    Spouse name: Not on file  . Number of children: 0  . Years of education: Not on file  . Highest education level: Not on file  Occupational History  . Not on file  Tobacco Use  . Smoking status: Never Smoker  . Smokeless tobacco: Never Used  Vaping Use  . Vaping Use: Never used  Substance and Sexual Activity  . Alcohol use: Not on file    Comment: occ  . Drug use: Not Currently  . Sexual activity: Yes    Birth control/protection: Post-menopausal  Other Topics Concern  . Not on file  Social History Narrative   Patient is a retired Naval architect.  She is a widower and notes that she moved from the [place] area after being reunited with a high school sweetheart whom she is now engaged to.   She lives locally in [place].   No children.   Social Determinants of Health   Financial Resource Strain: Not on file  Food Insecurity: Not on file  Transportation Needs: Not on file  Physical Activity: Not on file  Stress: Not on file  Social Connections:  Not on file  Intimate Partner Violence: Not on file    Current Outpatient Medications on File Prior to Visit  Medication Sig Dispense Refill  . amLODipine (NORVASC) 5 MG tablet Take 1 tablet (5 mg total) by mouth daily. 90 tablet 3  . Cholecalciferol 1.25 MG (50000 UT) capsule Take 1 capsule (50,000 Units total) by mouth every 7 (seven) days. 12 capsule 3  . gabapentin (NEURONTIN) 100 MG capsule Take 1 capsule (100 mg total) by mouth at bedtime for 7 days, THEN 2 capsules (200 mg total) at bedtime for 7 days, THEN 3 capsules (300 mg total) at bedtime for 24 days. 90 capsule 0   No current facility-administered medications on file prior to visit.    No Known Allergies  Family History  Problem Relation Age of Onset  . Diabetes Mother   . Depression Mother   . Hypertension Mother   . Lung cancer Father   . Hypertension Sister   . Hypertension Sister   . Thyroid disease Maternal Aunt     BP (!) 180/90 (BP Location: Right Arm, Patient Position: Sitting, Cuff Size: Normal)   Pulse 89   Ht 5\' 5"  (1.651 m)   Wt 178 lb 3.2 oz (80.8 kg)  SpO2 95%   BMI 29.65 kg/m   Review of Systems denies numbness and cold intolerance.  She has doe, fatigue, arthralgias, insomnia, mild depression, weight gain, dry skin, constipation, difficulty with concentration, and intermitt headache.    Objective:   Physical Exam VS: see vs page GEN: no distress HEAD: head: no deformity eyes: no periorbital swelling, no proptosis external nose and ears are normal NECK: thyroid is slightly enlarged, on the right side only CHEST WALL: no deformity LUNGS: clear to auscultation CV: reg rate and rhythm, no murmur.  MUSCULOSKELETAL: gait is normal and steady EXTEMITIES: no deformity.  no leg edema NEURO:  readily moves all 4's.  sensation is intact to touch on all 4's SKIN:  Normal texture and temperature.  Numerous patches of vitiligo.    NODES:  None palpable at the neck PSYCH: alert, well-oriented.  Does  not appear anxious nor depressed.      Lab Results  Component Value Date   CREATININE 0.75 06/14/2020   BUN 15 06/14/2020   NA 138 06/14/2020   K 4.4 06/14/2020   CL 100 06/14/2020   CO2 22 06/14/2020   I have reviewed outside records, and summarized: Pt was noted to have elevated TSH, and referred here.  HTN and urinary sxs were also addressed.  Lab Results  Component Value Date   TSH 5.21 (H) 07/13/2020   T4TOTAL 8.7 06/14/2020      Assessment & Plan:  chronic primary hypothyroidism: uncontrolled Neck sxs, new to me, uncertain etiology and prognosis  Patient Instructions  Blood tests are requested for you today.  We'll let you know about the results.  Let's check the ultrasound and spirometry.  you will receive a phone call, about days and times for appointments.   Please come back for a follow-up appointment in 3 months.

## 2020-07-13 NOTE — Patient Instructions (Addendum)
Blood tests are requested for you today.  We'll let you know about the results.  Let's check the ultrasound and spirometry.  you will receive a phone call, about days and times for appointments.   Please come back for a follow-up appointment in 3 months.

## 2020-08-01 ENCOUNTER — Ambulatory Visit
Admission: RE | Admit: 2020-08-01 | Discharge: 2020-08-01 | Disposition: A | Discharge status: Home / Self Care | Payer: Medicare Other | Source: Ambulatory Visit | Attending: Endocrinology | Admitting: Endocrinology

## 2020-08-01 ENCOUNTER — Other Ambulatory Visit: Payer: Self-pay | Admitting: Endocrinology

## 2020-08-01 ENCOUNTER — Telehealth: Payer: Self-pay | Admitting: Endocrinology

## 2020-08-01 DIAGNOSIS — E039 Hypothyroidism, unspecified: Secondary | ICD-10-CM

## 2020-08-01 DIAGNOSIS — R221 Localized swelling, mass and lump, neck: Secondary | ICD-10-CM

## 2020-08-01 NOTE — Telephone Encounter (Signed)
[facility] called for a imaging report for the Thyroid ultrasound:   Impression has a 2.4 x 1.1 x 0.8 cm hypoechoic solid structure in the right neck favored to be adjacent to the thyroid rather than within it. This my represent an abnormally large lymphnod or parathyroid gland correlation with laboratory values for hyperparathyroidism is recommended for further evaluation with a CT neck soft tissue.    Form will be in Epic.

## 2020-08-02 ENCOUNTER — Other Ambulatory Visit: Payer: Self-pay

## 2020-08-02 ENCOUNTER — Other Ambulatory Visit (INDEPENDENT_AMBULATORY_CARE_PROVIDER_SITE_OTHER): Payer: Medicare Other

## 2020-08-02 DIAGNOSIS — E039 Hypothyroidism, unspecified: Secondary | ICD-10-CM | POA: Diagnosis not present

## 2020-08-02 DIAGNOSIS — R221 Localized swelling, mass and lump, neck: Secondary | ICD-10-CM | POA: Diagnosis not present

## 2020-08-02 LAB — T4, FREE: Free T4: 1.28 ng/dL (ref 0.60–1.60)

## 2020-08-02 LAB — TSH: TSH: 2 u[IU]/mL (ref 0.35–4.50)

## 2020-08-03 ENCOUNTER — Other Ambulatory Visit: Payer: Self-pay | Admitting: Endocrinology

## 2020-08-03 DIAGNOSIS — R221 Localized swelling, mass and lump, neck: Secondary | ICD-10-CM

## 2020-08-03 LAB — PTH, INTACT AND CALCIUM
Calcium: 9.7 mg/dL (ref 8.6–10.4)
PTH: 53 pg/mL (ref 16–77)

## 2020-08-22 ENCOUNTER — Encounter: Payer: Medicare Other | Admitting: Family Medicine

## 2020-08-30 ENCOUNTER — Other Ambulatory Visit: Payer: Self-pay

## 2020-08-30 ENCOUNTER — Ambulatory Visit (INDEPENDENT_AMBULATORY_CARE_PROVIDER_SITE_OTHER): Payer: Medicare Other | Admitting: Otolaryngology

## 2020-08-30 ENCOUNTER — Encounter (INDEPENDENT_AMBULATORY_CARE_PROVIDER_SITE_OTHER): Payer: Self-pay | Admitting: Otolaryngology

## 2020-08-30 VITALS — Temp 97.2°F

## 2020-08-30 DIAGNOSIS — R221 Localized swelling, mass and lump, neck: Secondary | ICD-10-CM | POA: Diagnosis not present

## 2020-08-30 DIAGNOSIS — Z87898 Personal history of other specified conditions: Secondary | ICD-10-CM

## 2020-08-30 DIAGNOSIS — K219 Gastro-esophageal reflux disease without esophagitis: Secondary | ICD-10-CM

## 2020-08-30 NOTE — Progress Notes (Signed)
HPI: Courtney White is a 65 y.o. female who presents is referred by Dr. Loanne Drilling for evaluation of neck nodule as well as neck nodule adjacent to the right thyroid gland on recent ultrasound study..  Patient states that she has noted a lump in front of her neck for about a year now.  She has had some hoarseness and has occasional cough.  She is not really hoarse in the office today.  She apparently had an ultrasound performed a month ago that demonstrated a 2.4 cm x 1.1 cm nodule adjacent to the right thyroid gland.  They suggested further evaluation of this with a CT scan.  Past Medical History:  Diagnosis Date  . Hepatitis C 1992   s/p treatment.  she has cleared virus  . HTN (hypertension)   . Thyroid disease   . Vitiligo    Past Surgical History:  Procedure Laterality Date  . TONSILLECTOMY AND ADENOIDECTOMY    . UTERINE FIBROID SURGERY     Social History   Socioeconomic History  . Marital status: Significant Other    Spouse name: Not on file  . Number of children: 0  . Years of education: Not on file  . Highest education level: Not on file  Occupational History  . Not on file  Tobacco Use  . Smoking status: Never Smoker  . Smokeless tobacco: Never Used  Vaping Use  . Vaping Use: Never used  Substance and Sexual Activity  . Alcohol use: Not on file    Comment: occ  . Drug use: Not Currently  . Sexual activity: Yes    Birth control/protection: Post-menopausal  Other Topics Concern  . Not on file  Social History Narrative   Patient is a retired Naval architect.  She is a widower and notes that she moved from the [place] area after being reunited with a high school sweetheart whom she is now engaged to.   She lives locally in [place].   No children.   Social Determinants of Health   Financial Resource Strain: Not on file  Food Insecurity: Not on file  Transportation Needs: Not on file  Physical Activity: Not on file  Stress: Not on file  Social Connections:  Not on file   Family History  Problem Relation Age of Onset  . Diabetes Mother   . Depression Mother   . Hypertension Mother   . Lung cancer Father   . Hypertension Sister   . Hypertension Sister   . Thyroid disease Maternal Aunt    No Known Allergies Prior to Admission medications   Medication Sig Start Date End Date Taking? Authorizing Provider  amLODipine (NORVASC) 5 MG tablet Take 1 tablet (5 mg total) by mouth daily. 06/14/20   [name], DO  Cholecalciferol 1.25 MG (50000 UT) capsule Take 1 capsule (50,000 Units total) by mouth every 7 (seven) days. 06/14/20   [name], DO  gabapentin (NEURONTIN) 100 MG capsule Take 1 capsule (100 mg total) by mouth at bedtime for 7 days, THEN 2 capsules (200 mg total) at bedtime for 7 days, THEN 3 capsules (300 mg total) at bedtime for 24 days. 06/14/20 07/22/20  [name], DO  levothyroxine (SYNTHROID) 125 MCG tablet Take 1 tablet (125 mcg total) by mouth daily. 07/13/20   Renato Shin, MD     Positive ROS: Otherwise negative  All other systems have been reviewed and were otherwise negative with the exception of those mentioned in the HPI and as above.  Physical  Exam: Constitutional: Alert, well-appearing, no acute distress.  She does not have any hoarseness today. Ears: External ears without lesions or tenderness. Ear canals are clear bilaterally with intact, clear TMs.  Nasal: External nose without lesions. Septum relatively midline with mild rhinitis.. Clear nasal passages otherwise Oral exam: Lips and gums without lesions. Tongue and palate mucosa without lesions. Posterior oropharynx clear. Fiberoptic laryngoscopy was performed through the left nostril.  The nasopharynx was clear.  The base of tongue vallecula and epiglottis were normal.  Vocal cords were clear bilaterally with normal vocal cord mobility.  She had mild edema of the arytenoid mucosa but no mucosal abnormalities noted. Neck: In the anterior lower  neck just above or in front of the upper portion of the sternum patient has what appears to be a subcutaneous mass consistent with a probable lipoma measuring approximately 2 to 2-1/2 cm in size.  I do not appreciate any significant adenopathy deeper in the neck or along the jugular chain of lymph nodes.  No supraclavicular adenopathy noted. Respiratory: Breathing comfortably  Skin: No facial/neck lesions or rash noted.  Laryngoscopy  Date/Time: 08/30/2020 6:25 PM Performed by: Rozetta Nunnery, MD Authorized by: Rozetta Nunnery, MD   Consent:    Consent obtained:  Verbal   Consent given by:  Patient Procedure details:    Indications: hoarseness, dysphagia, or aspiration     Medication:  Afrin   Instrument: flexible fiberoptic laryngoscope     Scope location: left nare   Sinus:    Left nasopharynx: normal   Mouth:    Oropharynx: normal     Vallecula: normal     Base of tongue: normal     Epiglottis: normal   Throat:    Pyriform sinus: normal     True vocal cords: normal   Comments:     On fiberoptic laryngoscopy vocal cords were clear bilaterally with normal vocal cord mobility.  She had mild edema of the arytenoid mucosa which may be indicative of laryngeal pharyngeal reflux.    Assessment: Midline lower neck lipoma. Ultrasound study noted a nodule adjacent to the right thyroid lobe. She probably has some degree of GE reflux that is contributing to her hoarseness and throat symptoms.  Plan: Placed her on omeprazole 40 mg daily before dinner for the next month. We will plan on scheduling a CT scan of the neck with contrast to evaluate the nodule noted on recent ultrasound study adjacent to the right thyroid lobe. She will follow-up in 3 to 4 weeks to review this and see how she does on omeprazole.   Radene Journey, MD   CC:

## 2020-09-01 ENCOUNTER — Other Ambulatory Visit (INDEPENDENT_AMBULATORY_CARE_PROVIDER_SITE_OTHER): Payer: Self-pay

## 2020-09-01 ENCOUNTER — Telehealth: Payer: Self-pay | Admitting: Cardiology

## 2020-09-01 DIAGNOSIS — R5383 Other fatigue: Secondary | ICD-10-CM

## 2020-09-01 DIAGNOSIS — R221 Localized swelling, mass and lump, neck: Secondary | ICD-10-CM

## 2020-09-01 NOTE — Telephone Encounter (Signed)
OK to sign.  Thanks.

## 2020-09-01 NOTE — Telephone Encounter (Signed)
Attempted to return call to patient, left message for patient to return call to office. Order for ETT placed.   Will forward to Dr. Rosezella [place] nurse.

## 2020-09-01 NOTE — Telephone Encounter (Signed)
Returned call to patient who states that she would like to schedule ETT that was ordered last year by Dr. Percival Spanish.   Patient states she is not currently having any symptoms at present but states that she did have some left arm pain, and fatigue a long time ago. Patient states that she had her Thyroid checked and everything came back normal so she would like to proceed with stress test.   Advised patient I would forward message to Dr. Percival Spanish for him to review and see if he would like to order one.   Made patient aware of ED precautions should new or worsening symptoms develop. Patient verbalized understanding.

## 2020-09-01 NOTE — Telephone Encounter (Signed)
Courtney White is calling wanting to reschedule her ETT that was canceled back in August of 2021. The order has expired, so a new one will need to be placed in order to schedule. She is requesting a callback to schedule once it is entered into the system. Please advise.

## 2020-09-09 ENCOUNTER — Other Ambulatory Visit: Payer: Self-pay | Admitting: *Deleted

## 2020-09-09 DIAGNOSIS — R5383 Other fatigue: Secondary | ICD-10-CM

## 2020-09-09 DIAGNOSIS — R072 Precordial pain: Secondary | ICD-10-CM

## 2020-09-16 ENCOUNTER — Telehealth (HOSPITAL_COMMUNITY): Payer: Self-pay | Admitting: *Deleted

## 2020-09-16 NOTE — Telephone Encounter (Signed)
Close encounter 

## 2020-09-20 ENCOUNTER — Ambulatory Visit (HOSPITAL_COMMUNITY)
Admission: RE | Admit: 2020-09-20 | Discharge: 2020-09-20 | Disposition: A | Discharge status: Home / Self Care | Payer: Medicare Other | Source: Ambulatory Visit | Attending: Cardiology | Admitting: Cardiology

## 2020-09-20 ENCOUNTER — Other Ambulatory Visit: Payer: Self-pay

## 2020-09-20 DIAGNOSIS — R5383 Other fatigue: Secondary | ICD-10-CM | POA: Diagnosis present

## 2020-09-20 LAB — EXERCISE TOLERANCE TEST
Estimated workload: 8.9 METS
Exercise duration (min): 7 min
Exercise duration (sec): 17 s
MPHR: 155 {beats}/min
Peak HR: 162 {beats}/min
Percent HR: 104 %
Rest HR: 84 {beats}/min

## 2020-09-22 ENCOUNTER — Ambulatory Visit (INDEPENDENT_AMBULATORY_CARE_PROVIDER_SITE_OTHER): Payer: Medicare Other | Admitting: Otolaryngology

## 2020-09-23 ENCOUNTER — Ambulatory Visit
Admission: RE | Admit: 2020-09-23 | Discharge: 2020-09-23 | Disposition: A | Discharge status: Home / Self Care | Payer: Medicare Other | Source: Ambulatory Visit | Attending: Otolaryngology | Admitting: Otolaryngology

## 2020-09-23 ENCOUNTER — Other Ambulatory Visit: Payer: Self-pay

## 2020-09-23 DIAGNOSIS — R221 Localized swelling, mass and lump, neck: Secondary | ICD-10-CM

## 2020-09-23 MED ORDER — IOPAMIDOL (ISOVUE-300) INJECTION 61%
75.0000 mL | Freq: Once | INTRAVENOUS | Status: AC | PRN
  Administered 2020-09-23: 75 mL via INTRAVENOUS

## 2020-09-26 NOTE — Telephone Encounter (Signed)
Patient had ETT 09/20/2020

## 2020-09-28 ENCOUNTER — Ambulatory Visit (INDEPENDENT_AMBULATORY_CARE_PROVIDER_SITE_OTHER): Payer: Medicare Other | Admitting: Otolaryngology

## 2020-09-28 ENCOUNTER — Encounter (INDEPENDENT_AMBULATORY_CARE_PROVIDER_SITE_OTHER): Payer: Self-pay | Admitting: Otolaryngology

## 2020-09-28 ENCOUNTER — Other Ambulatory Visit: Payer: Self-pay

## 2020-09-28 VITALS — Temp 97.0°F

## 2020-09-28 DIAGNOSIS — D351 Benign neoplasm of parathyroid gland: Secondary | ICD-10-CM | POA: Diagnosis not present

## 2020-09-28 NOTE — Progress Notes (Signed)
HPI: Courtney White is a 65 y.o. female who returns today for evaluation of recent CT scan to evaluate right parathyroid mass..  On review of the CT scan of the right 2.4 cm mass adjacent to the right thyroid gland it had characteristics consistent with probable parathyroid adenoma.  She has had previous calcium levels that have been slightly elevated or high normal. But her main complaints today with her husband include chronic fatigue over the past year and a half ever since she got a case of bronchitis.  She has intermittent hoarseness and coughing.  Her hoarseness tends to come and go.  She is having no hoarseness in the office today.  But she feels chronic fatigue.  Past Medical History:  Diagnosis Date  . Hepatitis C 1992   s/p treatment.  she has cleared virus  . HTN (hypertension)   . Thyroid disease   . Vitiligo    Past Surgical History:  Procedure Laterality Date  . TONSILLECTOMY AND ADENOIDECTOMY    . UTERINE FIBROID SURGERY     Social History   Socioeconomic History  . Marital status: Significant Other    Spouse name: Not on file  . Number of children: 0  . Years of education: Not on file  . Highest education level: Not on file  Occupational History  . Not on file  Tobacco Use  . Smoking status: Never Smoker  . Smokeless tobacco: Never Used  Vaping Use  . Vaping Use: Never used  Substance and Sexual Activity  . Alcohol use: Not on file    Comment: occ  . Drug use: Not Currently  . Sexual activity: Yes    Birth control/protection: Post-menopausal  Other Topics Concern  . Not on file  Social History Narrative   Patient is a retired Naval architect.  She is a widower and notes that she moved from the [place] area after being reunited with a high school sweetheart whom she is now engaged to.   She lives locally in [place].   No children.   Social Determinants of Health   Financial Resource Strain: Not on file  Food Insecurity: Not on file   Transportation Needs: Not on file  Physical Activity: Not on file  Stress: Not on file  Social Connections: Not on file   Family History  Problem Relation Age of Onset  . Diabetes Mother   . Depression Mother   . Hypertension Mother   . Lung cancer Father   . Hypertension Sister   . Hypertension Sister   . Thyroid disease Maternal Aunt    No Known Allergies Prior to Admission medications   Medication Sig Start Date End Date Taking? Authorizing Provider  amLODipine (NORVASC) 5 MG tablet Take 1 tablet (5 mg total) by mouth daily. 06/14/20   [name], DO  Cholecalciferol 1.25 MG (50000 UT) capsule Take 1 capsule (50,000 Units total) by mouth every 7 (seven) days. 06/14/20   [name], DO  gabapentin (NEURONTIN) 100 MG capsule Take 1 capsule (100 mg total) by mouth at bedtime for 7 days, THEN 2 capsules (200 mg total) at bedtime for 7 days, THEN 3 capsules (300 mg total) at bedtime for 24 days. 06/14/20 07/22/20  [name], DO  levothyroxine (SYNTHROID) 125 MCG tablet Take 1 tablet (125 mcg total) by mouth daily. 07/13/20   Renato Shin, MD     Positive ROS: Otherwise negative  All other systems have been reviewed and were otherwise negative with the exception  of those mentioned in the HPI and as above.  Physical Exam: Constitutional: Alert, well-appearing, no acute distress.  She is having no airway problems or hoarseness in the office today. Ears: External ears without lesions or tenderness. Ear canals are clear bilaterally with intact, clear TMs.  Nasal: External nose without lesions. Septum with mild deformity and mild rhinitis.. Clear nasal passages Oral: Lips and gums without lesions. Tongue and palate mucosa without lesions. Posterior oropharynx clear. Neck: No palpable adenopathy or masses.  The nodule in the right thyroid region is difficult to palpate on clinical exam.  She has no significant palpable adenopathy. Respiratory: Breathing comfortably   Skin: No facial/neck lesions or rash noted.  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  Plan: I discussed with her that this would probably warrant removal since she has had elevated calcium although I am not sure this is causing her complaints of chronic fatigue and hoarseness as well as chronic cough. I referred her to check with her endocrinologist as to whether the elevated calcium might cause her to be fatigued or not.  Also recommended parathyroid surgery with either Dr. Benjamine Mola or Dr. Harlow Asa as I do not routinely perform parathyroid surgery.   Radene Journey, MD

## 2020-10-03 ENCOUNTER — Encounter (INDEPENDENT_AMBULATORY_CARE_PROVIDER_SITE_OTHER): Payer: Self-pay

## 2020-10-05 ENCOUNTER — Encounter: Payer: Medicare Other | Admitting: Family Medicine

## 2020-10-12 ENCOUNTER — Ambulatory Visit: Payer: Medicare Other | Admitting: Internal Medicine

## 2020-10-12 ENCOUNTER — Ambulatory Visit (INDEPENDENT_AMBULATORY_CARE_PROVIDER_SITE_OTHER): Payer: Medicare Other

## 2020-10-12 DIAGNOSIS — Z0000 Encounter for general adult medical examination without abnormal findings: Secondary | ICD-10-CM | POA: Diagnosis not present

## 2020-10-12 NOTE — Patient Instructions (Signed)
  Ms. Marcelli , Thank you for taking time to come for your Medicare Wellness Visit. I appreciate your ongoing commitment to your health goals. Please review the following plan we discussed and let me know if I can assist you in the future.   These are the goals we discussed:  Goals      DIET - EAT MORE FRUITS AND VEGETABLES     Exercise 150 min/wk Moderate Activity        This is a list of the screening recommended for you and due dates:  Health Maintenance  Topic Date Due   Colon Cancer Screening  Never done   Zoster (Shingles) Vaccine (1 of 2) Never done   DEXA scan (bone density measurement)  Never done   COVID-19 Vaccine (4 - Booster for Moderna series) 08/02/2020   Pneumonia vaccines (2 of 2 - PPSV23) 10/18/2020   Flu Shot  11/21/2020   Mammogram  12/31/2021   Tetanus Vaccine  02/24/2029   HPV Vaccine  Aged Out

## 2020-10-12 NOTE — Progress Notes (Deleted)
Name: Courtney White  MRN/ DOB: [id number], 1955-10-28    Age/ Sex: 65 y.o., female    PCP: [name], DO   Reason for Endocrinology Evaluation:      Date of Initial Endocrinology Evaluation: 10/12/2020     HPI: [name] is a 65 y.o. female with a past medical history of ***. The patient presented for initial endocrinology clinic visit on 10/12/2020 for consultative assistance with her ***.    HISTORY:  Past Medical History:  Past Medical History:  Diagnosis Date   Hepatitis C 1992   s/p treatment.  she has cleared virus   HTN (hypertension)    Thyroid disease    Vitiligo    Past Surgical History:  Past Surgical History:  Procedure Laterality Date   TONSILLECTOMY AND ADENOIDECTOMY     UTERINE FIBROID SURGERY      Social History:  reports that she has never smoked. She has never used smokeless tobacco. She reports previous drug use. Family History: family history includes Depression in her mother; Diabetes in her mother; Hypertension in her mother, sister, and sister; Lung cancer in her father; Thyroid disease in her maternal aunt.   HOME MEDICATIONS: Allergies as of 10/12/2020   No Known Allergies      Medication List        Accurate as of October 12, 2020 10:25 AM. If you have any questions, ask your nurse or doctor.          amLODipine 5 MG tablet Commonly known as: NORVASC Take 1 tablet (5 mg total) by mouth daily.   Cholecalciferol 1.25 MG (50000 UT) capsule Take 1 capsule (50,000 Units total) by mouth every 7 (seven) days.   gabapentin 100 MG capsule Commonly known as: NEURONTIN Take 1 capsule (100 mg total) by mouth at bedtime for 7 days, THEN 2 capsules (200 mg total) at bedtime for 7 days, THEN 3 capsules (300 mg total) at bedtime for 24 days. Start taking on: June 14, 2020   levothyroxine 125 MCG tablet Commonly known as: SYNTHROID Take 1 tablet (125 mcg total) by mouth daily.          REVIEW OF SYSTEMS: A  comprehensive ROS was conducted with the patient and is negative except as per HPI and below:  ROS     OBJECTIVE:  VS: There were no vitals taken for this visit.   Wt Readings from Last 3 Encounters:  07/13/20 178 lb 3.2 oz (80.8 kg)  06/14/20 175 lb (79.4 kg)  11/25/19 172 lb (78 kg)     EXAM: General: Pt appears well and is in NAD  Hydration: Well-hydrated with moist mucous membranes and good skin turgor  Eyes: External eye exam normal without stare, lid lag or exophthalmos.  EOM intact.  PERRL.  Ears, Nose, Throat: Hearing: Grossly intact bilaterally Dental: Good dentition  Throat: Clear without mass, erythema or exudate  Neck: General: Supple without adenopathy. Thyroid: Thyroid size normal.  No goiter or nodules appreciated. No thyroid bruit.  Lungs: Clear with good BS bilat with no rales, rhonchi, or wheezes  Heart: Auscultation: RRR.  Abdomen: Normoactive bowel sounds, soft, nontender, without masses or organomegaly palpable  Extremities: Gait and station: Normal gait  Digits and nails: No clubbing, cyanosis, petechiae, or nodes Head and neck: Normal alignment and mobility BL UE: Normal ROM and strength. BL LE: No pretibial edema normal ROM and strength.  Skin: Hair: Texture and amount normal with gender appropriate distribution Skin Inspection: No  rashes, acanthosis nigricans/skin tags. No lipohypertrophy Skin Palpation: Skin temperature, texture, and thickness normal to palpation  Neuro: Cranial nerves: II - XII grossly intact  Cerebellar: Normal coordination and movement; no tremor Motor: Normal strength throughout DTRs: 2+ and symmetric in UE without delay in relaxation phase  Mental Status: Judgment, insight: Intact Orientation: Oriented to time, place, and person Memory: Intact for recent and remote events Mood and affect: No depression, anxiety, or agitation     DATA REVIEWED:      Thyroid Ultrasound 08/01/2020   Parenchymal Echotexture: Mildly  heterogeneous   Isthmus: 0.3 cm   Right lobe: 3.9 x 1.6 x 1.6 cm   Left lobe: 4.9 x 1.5 x 1.8 cm   _________________________________________________________   Estimated total number of nodules >/= 1 cm: 0   Number of spongiform nodules >/=  2 cm not described below (TR1): 0   Number of mixed cystic and solid nodules >/= 1.5 cm not described below (TR2): 0   _________________________________________________________   There is a 2.4 x 1.1 x 0.8 cm ovoid mass in the right neck which is likely an adjacent to the right thyroid lobe rather than within it.   No discrete sonographic abnormality identified at the sternal notch in the area of swelling.   IMPRESSION: 2.4 x 1.1 x 0.8 cm hypoechoic solid structure in the right neck favored to be adjacent to the thyroid rather than within it. This may represent an abnormally enlarged lymph node or parathyroid gland. Correlation with laboratory values for hyperparathyroidism is recommended.   Further evaluation with contrast enhanced soft tissue neck CT should be performed to further evaluate this structure. If this nodule does appear to be part of the thyroid on CT it should be further evaluated with FNA.   These results will be called to the ordering clinician or representative by the Radiologist Assistant, and communication documented in the PACS or [employer].   ASSESSMENT/PLAN/RECOMMENDATIONS:   Hypothyroidism:    Medications :  Signed electronically by: Mack Guise, MD  [facility]  [facility] [address], [place], [place] [address] Phone: [phone] FAX: [phone]   CC: [name], DO [place] [address] Phone: [phone] Fax: [phone]   Return to Endocrinology clinic as below: Future Appointments  Date Time Provider [place]  10/12/2020  1:20 PM Brendi Mccarroll, Melanie Crazier, MD [facility] None  10/12/2020   2:00 PM [facility]NUAL WELLNESS VISIT [facility] None  10/21/2020 11:00 AM [name], DO [facility] None  12/12/2020  1:30 PM [name], DO [facility] None

## 2020-10-12 NOTE — Progress Notes (Signed)
MEDICARE ANNUAL WELLNESS VISIT  10/12/2020  Telephone Visit Disclaimer This Medicare AWV was conducted by telephone due to national recommendations for restrictions regarding the COVID-19 Pandemic (e.g. social distancing).  I verified, using two identifiers, that I am speaking with Courtney White or their authorized healthcare agent. I discussed the limitations, risks, security, and privacy concerns of performing an evaluation and management service by telephone and the potential availability of an in-person appointment in the future. The patient expressed understanding and agreed to proceed.  Location of Patient: Home Location of Provider (nurse):  [facility]  Subjective:    Courtney White is a 65 y.o. female patient of [name], DO who had a Medicare Annual Wellness Visit today via telephone. Courtney White resides in nearby [place]. She is a widow. She doesn't have any children. 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   Patient Care Team: [name], DO as PCP - General (Family Medicine)  Advanced Directives 10/12/2020  Does Patient Have a Medical Advance Directive? Yes  Type of Advance Directive Living will;Healthcare Power of [place] in Chart? No - copy requested    Hospital Utilization Over the Past 12 Months: # of hospitalizations or ER visits: 0 # of surgeries: 0  Review of Systems    Patient reports that her overall health is unchanged compared to last year.  History obtained from chart review  Patient Reported Readings (BP, Pulse, CBG, Weight, etc) none  Pain Assessment Pain : No/denies pain     Current Medications & Allergies (verified) Allergies as of 10/12/2020   No Known Allergies      Medication List        Accurate as of October 12, 2020  2:40 PM. If you have any questions, ask your nurse or doctor.          amLODipine 5 MG  tablet Commonly known as: NORVASC Take 1 tablet (5 mg total) by mouth daily.   Cholecalciferol 1.25 MG (50000 UT) capsule Take 1 capsule (50,000 Units total) by mouth every 7 (seven) days.   gabapentin 100 MG capsule Commonly known as: NEURONTIN Take 1 capsule (100 mg total) by mouth at bedtime for 7 days, THEN 2 capsules (200 mg total) at bedtime for 7 days, THEN 3 capsules (300 mg total) at bedtime for 24 days. Start taking on: June 14, 2020   levothyroxine 125 MCG tablet Commonly known as: SYNTHROID Take 1 tablet (125 mcg total) by mouth daily.        History (reviewed): Past Medical History:  Diagnosis Date   Hepatitis C 1992   s/p treatment.  she has cleared virus   HTN (hypertension)    Thyroid disease    Vitiligo    Past Surgical History:  Procedure Laterality Date   TONSILLECTOMY AND ADENOIDECTOMY     UTERINE FIBROID SURGERY     Family History  Problem Relation Age of Onset   Diabetes Mother    Depression Mother    Hypertension Mother    Lung cancer Father    Hypertension Sister    Hypertension Sister    Thyroid disease Maternal Aunt    Social History   Socioeconomic History   Marital status: Significant Other    Spouse name: Not on file   Number of children: 0   Years of education: Not on file   Highest education level: Not on file  Occupational History  Not on file  Tobacco Use   Smoking status: Never   Smokeless tobacco: Never  Vaping Use   Vaping Use: Never used  Substance and Sexual Activity   Alcohol use: Not on file    Comment: occ   Drug use: Not Currently   Sexual activity: Yes    Birth control/protection: Post-menopausal  Other Topics Concern   Not on file  Social History Narrative   Patient is a retired Naval architect.  She is a widower and notes that she moved from the [place] area after being reunited with a high school sweetheart whom she is now engaged to.   She lives locally in [place].   No children.   Social  Determinants of Health   Financial Resource Strain: Not on file  Food Insecurity: Not on file  Transportation Needs: Not on file  Physical Activity: Not on file  Stress: Not on file  Social Connections: Not on file    Activities of Daily Living In your present state of health, do you have any difficulty performing the following activities: 10/12/2020  Hearing? N  Vision? N  Difficulty concentrating or making decisions? N  Walking or climbing stairs? N  Dressing or bathing? N  Doing errands, shopping? N  Preparing Food and eating ? N  Using the Toilet? N  In the past six months, have you accidently leaked urine? N  Do you have problems with loss of bowel control? N  Managing your Medications? N  Managing your Finances? N  Housekeeping or managing your Housekeeping? N  Some recent data might be hidden    Patient Education/ Literacy How often do you need to have someone help you when you read instructions, pamphlets, or other written materials from your doctor or pharmacy?: 1 - Never What is the last grade level you completed in school?: Some college  Exercise Current Exercise Habits: The patient does not participate in regular exercise at present, Exercise limited by: None identified  Diet Patient reports consuming 3 meals a day and 2 snack(s) a day Patient reports that her primary diet is: Regular Patient reports that she does have regular access to food.   Depression Screen PHQ 2/9 Scores 10/12/2020 06/14/2020 10/19/2019 09/30/2019 03/31/2019 02/25/2019  PHQ - 2 Score 0 0 0 0 0 0  PHQ- 9 Score - 0 - - 0 0     Fall Risk Fall Risk  10/12/2020 06/14/2020 10/19/2019 10/19/2019 09/30/2019  Falls in the past year? 0 0 1 0 0  Number falls in past yr: - - 0 - -  Injury with Fall? - - 1 - -  Risk for fall due to : - - History of fall(s) - -  Follow up - - Falls evaluation completed - -     Objective:  Courtney White seemed alert and oriented and she participated appropriately during  our telephone visit.  Blood Pressure Weight BMI  BP Readings from Last 3 Encounters:  07/13/20 (!) 180/90  06/14/20 138/87  11/25/19 131/88   Wt Readings from Last 3 Encounters:  07/13/20 178 lb 3.2 oz (80.8 kg)  06/14/20 175 lb (79.4 kg)  11/25/19 172 lb (78 kg)   BMI Readings from Last 1 Encounters:  07/13/20 29.65 kg/m    *Unable to obtain current vital signs, weight, and BMI due to telephone visit type  Hearing/Vision  Courtney White did not seem to have difficulty with hearing/understanding during the telephone conversation Reports that she has had a formal eye exam by  an eye care professional within the past year Reports that she has had a formal hearing evaluation within the past year *Unable to fully assess hearing and vision during telephone visit type  Cognitive Function: 6CIT Screen 10/12/2020  What Year? 0 points  What month? 0 points  What time? 0 points  Count back from 20 0 points  Months in reverse 0 points  Repeat phrase 0 points  Total Score 0   (Normal:0-7, Significant for Dysfunction: >8)  Normal Cognitive Function Screening: Yes   Immunization & Health Maintenance Record Immunization History  Administered Date(s) Administered   Influenza-Unspecified 01/23/2018, 04/03/2020   Moderna Sars-Covid-2 Vaccination 06/18/2019, 07/17/2019, 04/03/2020   Pneumococcal Conjugate-13 10/19/2019   Tdap 02/25/2019    Health Maintenance  Topic Date Due   COLONOSCOPY (Pts 45-36yrs Insurance coverage will need to be confirmed)  Never done   Zoster Vaccines- Shingrix (1 of 2) Never done   DEXA SCAN  Never done   COVID-19 Vaccine (4 - Booster for Moderna series) 08/02/2020   PNA vac Low Risk Adult (2 of 2 - PPSV23) 10/18/2020   INFLUENZA VACCINE  11/21/2020   MAMMOGRAM  12/31/2021   TETANUS/TDAP  02/24/2029   HPV VACCINES  Aged Out       Assessment  This is a routine wellness examination for [employer].  Health Maintenance: Due or Overdue Health Maintenance  Due  Topic Date Due   COLONOSCOPY (Pts 45-12yrs Insurance coverage will need to be confirmed)  Never done   Zoster Vaccines- Shingrix (1 of 2) Never done   DEXA SCAN  Never done   COVID-19 Vaccine (4 - Booster for Moderna series) 08/02/2020    Courtney White does not need a referral for [employer] Assistance: Care Management:   no Social Work:    no Prescription Assistance:  no Nutrition/Diabetes Education:  no   Plan:  Personalized Goals  Goals Addressed             This Visit's Progress    DIET - EAT MORE FRUITS AND VEGETABLES       Exercise 150 min/wk Moderate Activity         Personalized Health Maintenance & Screening Recommendations  Shingrix  Lung Cancer Screening Recommended: no (Low Dose CT Chest recommended if Age 67-80 years, 30 pack-year currently smoking OR have quit w/in past 15 years) Hepatitis C Screening recommended: no HIV Screening recommended: no  Advanced Directives: Written information was not prepared per patient's request.  Referrals & Orders No orders of the defined types were placed in this encounter.   Follow-up Plan Follow-up with [name], DO as planned Schedule for a shingrix vaccine    I have personally reviewed and noted the following in the patient's chart:   Medical and social history Use of alcohol, tobacco or illicit drugs  Current medications and supplements Functional ability and status Nutritional status Physical activity Advanced directives List of other physicians Hospitalizations, surgeries, and ER visits in previous 12 months Vitals Screenings to include cognitive, depression, and falls Referrals and appointments  In addition, I have reviewed and discussed with Courtney White certain preventive protocols, quality metrics, and best practice recommendations. A written personalized care plan for preventive services as well as general preventive health recommendations is available and can be mailed to the  patient at her request.      Courtney Infante LPN 12/01/9145

## 2020-10-13 ENCOUNTER — Ambulatory Visit: Payer: Medicare Other | Admitting: Endocrinology

## 2020-10-19 ENCOUNTER — Other Ambulatory Visit (HOSPITAL_COMMUNITY): Payer: Self-pay | Admitting: Otolaryngology

## 2020-10-19 DIAGNOSIS — E041 Nontoxic single thyroid nodule: Secondary | ICD-10-CM

## 2020-10-19 DIAGNOSIS — E079 Disorder of thyroid, unspecified: Secondary | ICD-10-CM

## 2020-10-21 ENCOUNTER — Ambulatory Visit (INDEPENDENT_AMBULATORY_CARE_PROVIDER_SITE_OTHER): Payer: Medicare Other

## 2020-10-21 ENCOUNTER — Ambulatory Visit: Payer: Medicare Other | Admitting: Family Medicine

## 2020-10-21 ENCOUNTER — Encounter: Payer: Self-pay | Admitting: Family Medicine

## 2020-10-21 ENCOUNTER — Encounter (HOSPITAL_COMMUNITY): Payer: Self-pay

## 2020-10-21 ENCOUNTER — Other Ambulatory Visit: Payer: Self-pay

## 2020-10-21 VITALS — BP 133/89 | HR 87 | Temp 97.8°F | Ht 65.0 in | Wt 175.8 lb

## 2020-10-21 DIAGNOSIS — Z780 Asymptomatic menopausal state: Secondary | ICD-10-CM | POA: Diagnosis not present

## 2020-10-21 DIAGNOSIS — E039 Hypothyroidism, unspecified: Secondary | ICD-10-CM

## 2020-10-21 DIAGNOSIS — E559 Vitamin D deficiency, unspecified: Secondary | ICD-10-CM | POA: Diagnosis not present

## 2020-10-21 DIAGNOSIS — D351 Benign neoplasm of parathyroid gland: Secondary | ICD-10-CM | POA: Diagnosis not present

## 2020-10-21 DIAGNOSIS — I10 Essential (primary) hypertension: Secondary | ICD-10-CM

## 2020-10-21 NOTE — Progress Notes (Signed)
       Patient Demographics  Patient Name  Courtney White, Courtney White Legal Sex  Female DOB  [birth year] SSN  [id number] Address  [address]  [place] [address] Phone  [phone] [facility])  [phone] (Mobile) *Preferred*     RE: Biopsy Received: Lambert Mody, Rosanne Ashing, MD  Leta Baptist, MD; Lenore Cordia 10-4   Orva Gwaltney - please schedule [name] for ultrasound guided right neck mass biopsy.   Dylan         Previous Messages    ----- Message -----  From: Leta Baptist, MD  Sent: 10/20/2020   4:25 PM EDT  To: Suzette Battiest, MD  Subject: RE: Biopsy                                     The pt has normal PTH and normal calcium. It is not a parathyroid Adenoma.  ----- Message -----  From: Suzette Battiest, MD  Sent: 10/20/2020   4:23 PM EDT  To: Leta Baptist, MD, Lenore Cordia  Subject: RE: Biopsy                                     Dr. Netta Corrigan reviewed Ms. Bridger's case and imaging.  Curious why you would pursue biopsy over sestamibi nuclear medicine study?   Thanks,   Ruthann Cancer    ----- Message -----  From: Lenore Cordia  Sent: 10/20/2020  10:06 AM EDT  To: Ir Procedure Requests  Subject: Biopsy                                         Procedure Requested: [place]  FNA    Reason for Procedure:  Thyroid Nodule/Thyroid Mass    Provider Requesting: Dr Benjamine Mola  Provider Telephone: [phone]    Other Info:

## 2020-10-21 NOTE — Patient Instructions (Signed)
You had labs performed today.  You will be contacted with the results of the labs once they are available, usually in the next 3 business days for routine lab work.  If you have an active my chart account, they will be released to your MyChart.  If you prefer to have these labs released to you via telephone, please let us know.  Bone Density Test A bone density test uses a type of X-ray to measure the amount of calcium and other minerals in a person's bones. It can measure bone density in the hip and the spine. The test is similar to having a regular X-ray. This test may also be called: Bone densitometry. Bone mineral density test. Dual-energy X-ray absorptiometry (DEXA). You may have this test to: Diagnose a condition that causes weak or thin bones (osteoporosis). Screen you for osteoporosis. Predict your risk for a broken bone (fracture). Determine how well your osteoporosis treatment is working. Tell a health care provider about: Any allergies you have. All medicines you are taking, including vitamins, herbs, eye drops, creams, and over-the-counter medicines. Any problems you or family members have had with anesthetic medicines. Any blood disorders you have. Any surgeries you have had. Any medical conditions you have. Whether you are pregnant or may be pregnant. Any medical tests you have had within the past 14 days that used contrast material. What are the risks? Generally, this is a safe test. However, it does expose you to a small amountof radiation, which can slightly increase your cancer risk. What happens before the test? Do not take any calcium supplements within the 24 hours before your test. You will need to remove all metal jewelry, eyeglasses, removable dental appliances, and any other metal objects on your body. What happens during the test?  You will lie down on an exam table. There will be an X-ray generator below you and an imaging device above you. Other devices, such  as boxes or braces, may be used to position your body properly for the scan. The machine will slowly scan your body. You will need to keep very still while the machine does the scan. The images will show up on a screen in the room. Images will be examined by a specialist after your test is finished. The procedure may vary among health care providers and hospitals. What can I expect after the test? It is up to you to get the results of your test. Ask your health care provider,or the department that is doing the test, when your results will be ready. Summary A bone density test is an imaging test that uses a type of X-ray to measure the amount of calcium and other minerals in your bones. The test may be used to diagnose or screen you for a condition that causes weak or thin bones (osteoporosis), predict your risk for a broken bone (fracture), or determine how well your osteoporosis treatment is working. Do not take any calcium supplements within 24 hours before your test. Ask your health care provider, or the department that is doing the test, when your results will be ready. This information is not intended to replace advice given to you by your health care provider. Make sure you discuss any questions you have with your healthcare provider. Document Revised: 09/24/2019 Document Reviewed: 09/24/2019 Elsevier Patient Education  [place].

## 2020-10-21 NOTE — Progress Notes (Signed)
Subjective: CC: Follow-up hypercalcemia, new diagnosis of parathyroid adenoma PCP: Courtney Norlander, DO EBR:AXENMM Courtney White is a 65 y.o. female presenting to clinic today for:  1.  Parathyroid adenoma Patient is accompanied today by her spouse.  She notes that she was recently diagnosed with a parathyroid adenoma by ENT on 09/28/2020.  She will be having a biopsy of this lesion on 13 July with Dr. Benjamine Mola.  Her last calcium level was in normal range at 9.7 in April.  PTH was normal at that time as well.  She does have history of slight hypercalcemia back in February.  She does report ongoing fatigue, some mid line back pain that typically occurs in the low back but sometimes will affect her entire back.  She denies any lower extremity weakness, sensory changes or falls.  She uses ibuprofen this seems to resolve the symptoms.  She admits to some sensation of shortness of breath and wheezing.  She does have a history of working with chemicals but notes that this was always under a hood or she was masked when they were considered caustic.  2.  Hypothyroidism Patient is compliant with Synthroid 125 mcg daily.  She is having the neck mass addressed as above.  Reports fatigue as above.  3.  Hypertension Patient is compliant with amlodipine 5 mg daily.  No reports of chest pain.  She reports some shortness of breath however.   ROS: Per HPI  No Known Allergies Past Medical History:  Diagnosis Date   Hepatitis C 1992   s/p treatment.  she has cleared virus   HTN (hypertension)    Thyroid disease    Vitiligo     Current Outpatient Medications:    amLODipine (NORVASC) 5 MG tablet, Take 1 tablet (5 mg total) by mouth daily., Disp: 90 tablet, Rfl: 3   cholecalciferol (VITAMIN D3) 25 MCG (1000 UNIT) tablet, Take 1,000 Units by mouth daily., Disp: , Rfl:    fluticasone (FLONASE) 50 MCG/ACT nasal spray, Place into both nostrils daily., Disp: , Rfl:    levothyroxine (SYNTHROID) 125 MCG tablet, Take  1 tablet (125 mcg total) by mouth daily., Disp: 90 tablet, Rfl: 3 Social History   Socioeconomic History   Marital status: Significant Other    Spouse name: Not on file   Number of children: 0   Years of education: Not on file   Highest education level: Not on file  Occupational History   Not on file  Tobacco Use   Smoking status: Never   Smokeless tobacco: Never  Vaping Use   Vaping Use: Never used  Substance and Sexual Activity   Alcohol use: Not on file    Comment: occ   Drug use: Not Currently   Sexual activity: Yes    Birth control/protection: Post-menopausal  Other Topics Concern   Not on file  Social History Narrative   Patient is a retired Naval architect.  She is a widower and notes that she moved from the [place] area after being reunited with a high school sweetheart whom she is now engaged to.   She lives locally in [place].   No children.   Social Determinants of Health   Financial Resource Strain: Not on file  Food Insecurity: Not on file  Transportation Needs: Not on file  Physical Activity: Not on file  Stress: Not on file  Social Connections: Not on file  Intimate Partner Violence: Not on file   Family History  Problem Relation Age of Onset  Diabetes Mother    Depression Mother    Hypertension Mother    Lung cancer Father    Hypertension Sister    Hypertension Sister    Thyroid disease Maternal Aunt     Objective: Office vital signs reviewed. BP 133/89   Pulse 87   Temp 97.8 F (36.6 C)   Ht 5' 5" (1.651 m)   Wt 175 lb 12.8 oz (79.7 kg)   SpO2 97%   BMI 29.25 kg/m   Physical Examination:  General: Awake, tired appearing, No acute distress HEENT: Sclera white.  No exophthalmos.  Palpable goiter at the bottom of the neck Cardio: regular rate and rhythm, S1S2 heard, no murmurs appreciated Pulm: clear to auscultation bilaterally, no wheezes, rhonchi or rales; normal work of breathing on room air GI: soft, non-tender,  non-distended, bowel sounds present x4, no hepatomegaly, no splenomegaly, no masses Extremities: warm, well perfused, No edema, cyanosis or clubbing; +2 pulses bilaterally MSK: Ambulating independently  No results found.  Assessment/ Plan: 64 y.o. female   Essential hypertension - Plan: CMP14+EGFR, Lipid Panel  Acquired hypothyroidism - Plan: TSH, T4, Free, Lipid Panel  Parathyroid adenoma - Plan: PTH, Intact and Calcium, CBC with Differential, DG [facility] DEXA  Vitamin D deficiency - Plan: VITAMIN D 25 Hydroxy (Vit-D Deficiency, Fractures)  Blood pressure under good control.  Continue current regimen.  Check CMP, lipid panel  She is symptomatic from a thyroid standpoint, citing fatigue.  Uncertain if this is related to hypercalcemia given that her most recent calcium level in normal range. Would like to recheck her PTH, calcium, CBC and order a DEXA scan as well given her reports of midline back pain.  Vitamin D check ordered as well given history of deficiency and above.  We discussed that if we really do not find any etiology for her symptoms with regards to this neck mass, I favor sending her to a pulmonologist for pulmonary function test given history of chemical exposures.  She is a very complex patient with a rapid decline in her quality of life and overall health in the last year.  Would really like to get to the bottom of what is going on with her  Would like to see her in about 6 weeks for follow-up on the results and proceeding with a solid plan  No orders of the defined types were placed in this encounter.  No orders of the defined types were placed in this encounter.    Courtney Norlander, DO [place] [phone]

## 2020-10-22 LAB — CBC WITH DIFFERENTIAL/PLATELET
Basophils Absolute: 0.1 10*3/uL (ref 0.0–0.2)
Basos: 1 %
EOS (ABSOLUTE): 0.3 10*3/uL (ref 0.0–0.4)
Eos: 4 %
Hematocrit: 41.3 % (ref 34.0–46.6)
Hemoglobin: 13.8 g/dL (ref 11.1–15.9)
Immature Grans (Abs): 0 10*3/uL (ref 0.0–0.1)
Immature Granulocytes: 0 %
Lymphocytes Absolute: 1.8 10*3/uL (ref 0.7–3.1)
Lymphs: 26 %
MCH: 29.6 pg (ref 26.6–33.0)
MCHC: 33.4 g/dL (ref 31.5–35.7)
MCV: 89 fL (ref 79–97)
Monocytes Absolute: 0.5 10*3/uL (ref 0.1–0.9)
Monocytes: 6 %
Neutrophils Absolute: 4.5 10*3/uL (ref 1.4–7.0)
Neutrophils: 63 %
Platelets: 302 10*3/uL (ref 150–450)
RBC: 4.66 x10E6/uL (ref 3.77–5.28)
RDW: 12.7 % (ref 11.7–15.4)
WBC: 7.1 10*3/uL (ref 3.4–10.8)

## 2020-10-22 LAB — CMP14+EGFR
ALT: 24 IU/L (ref 0–32)
AST: 21 IU/L (ref 0–40)
Albumin/Globulin Ratio: 1.6 (ref 1.2–2.2)
Albumin: 4.6 g/dL (ref 3.8–4.8)
Alkaline Phosphatase: 114 IU/L (ref 44–121)
BUN/Creatinine Ratio: 22 (ref 12–28)
BUN: 15 mg/dL (ref 8–27)
Bilirubin Total: 0.2 mg/dL (ref 0.0–1.2)
CO2: 23 mmol/L (ref 20–29)
Calcium: 10.2 mg/dL (ref 8.7–10.3)
Chloride: 105 mmol/L (ref 96–106)
Creatinine, Ser: 0.68 mg/dL (ref 0.57–1.00)
Globulin, Total: 2.8 g/dL (ref 1.5–4.5)
Glucose: 96 mg/dL (ref 65–99)
Potassium: 4.9 mmol/L (ref 3.5–5.2)
Sodium: 141 mmol/L (ref 134–144)
Total Protein: 7.4 g/dL (ref 6.0–8.5)
eGFR: 96 mL/min/{1.73_m2} (ref >59)

## 2020-10-22 LAB — LIPID PANEL
Chol/HDL Ratio: 3.8 ratio (ref 0.0–4.4)
Cholesterol, Total: 198 mg/dL (ref 100–199)
HDL: 52 mg/dL (ref >39)
LDL Chol Calc (NIH): 120 mg/dL — ABNORMAL HIGH (ref 0–99)
Triglycerides: 150 mg/dL — ABNORMAL HIGH (ref 0–149)
VLDL Cholesterol Cal: 26 mg/dL (ref 5–40)

## 2020-10-22 LAB — T4, FREE: Free T4: 1.69 ng/dL (ref 0.82–1.77)

## 2020-10-22 LAB — VITAMIN D 25 HYDROXY (VIT D DEFICIENCY, FRACTURES): Vit D, 25-Hydroxy: 39.5 ng/mL (ref 30.0–100.0)

## 2020-10-22 LAB — TSH: TSH: 1.38 u[IU]/mL (ref 0.450–4.500)

## 2020-10-22 LAB — PTH, INTACT AND CALCIUM: PTH: 52 pg/mL (ref 15–65)

## 2020-10-28 ENCOUNTER — Other Ambulatory Visit: Payer: Self-pay | Admitting: Family

## 2020-10-28 MED ORDER — ROSUVASTATIN CALCIUM 10 MG PO TABS: 10.0000 mg | ORAL_TABLET | Freq: Every day | ORAL | 3 refills | Status: DC

## 2020-10-28 NOTE — Progress Notes (Signed)
Pt r/c about labs 

## 2020-10-29 ENCOUNTER — Other Ambulatory Visit (INDEPENDENT_AMBULATORY_CARE_PROVIDER_SITE_OTHER): Payer: Self-pay | Admitting: Otolaryngology

## 2020-10-31 ENCOUNTER — Telehealth: Payer: Self-pay | Admitting: Family Medicine

## 2020-11-01 ENCOUNTER — Other Ambulatory Visit: Payer: Self-pay | Admitting: Radiology

## 2020-11-01 NOTE — H&P (Signed)
Chief Complaint: Patient was seen in consultation today for right neck mass  Referring Physician(s): Teoh,Su  Supervising Physician: Sandi Mariscal  Patient Status: Courtney White - Out-pt  History of Present Illness: Courtney White is a 65 y.o. female with past medical history of Hep C, HTN, vitiligo who recently felt a right sided neck mass.  Further evaluation revealed a 2.4 cm soft tissue nodule along the right thyroid margin.  She was referred to IR for biopsy. Case reviewed and approved by Dr. Serafina Royals.   Courtney White presents to IR today for procedure. She is in her usual state of health with no new complaints or concerns.  She has been NPO and is requesting sedation for the procedure today.  She has a ride home where she lives with her partner.   Past Medical History:  Diagnosis Date   Hepatitis C 1992   s/p treatment.  she has cleared virus   HTN (hypertension)    Thyroid disease    Vitiligo     Past Surgical History:  Procedure Laterality Date   TONSILLECTOMY AND ADENOIDECTOMY     UTERINE FIBROID SURGERY      Allergies: Patient has no known allergies.  Medications: Prior to Admission medications   Medication Sig Start Date End Date Taking? Authorizing Provider  amLODipine (NORVASC) 5 MG tablet Take 1 tablet (5 mg total) by mouth daily. Patient taking differently: Take 5 mg by mouth every evening. 06/14/20  Yes Gottschalk, Ashly M, DO  Carboxymethylcellul-Glycerin (LUBRICATING EYE DROPS OP) Place 1 drop into both eyes daily as needed (dry eyes).   Yes [provider]  fluticasone (FLONASE) 50 MCG/ACT nasal spray Place 1 spray into both nostrils daily.   Yes [provider]  ibuprofen (ADVIL) 200 MG tablet Take 400-600 mg by mouth every 6 (six) hours as needed for moderate pain.   Yes [provider]  levothyroxine (SYNTHROID) 125 MCG tablet Take 1 tablet (125 mcg total) by mouth daily. Patient taking differently: Take 125 mcg by mouth daily before  breakfast. 07/13/20  Yes Renato Shin, MD  Menthol, Topical Analgesic, (ICY HOT) 16 % LIQD Apply 1 application topically daily as needed (pain).   Yes [provider]  Vitamin D, Ergocalciferol, (DRISDOL) 1.25 MG (50000 UNIT) CAPS capsule Take 50,000 Units by mouth every Monday.   Yes [provider]  rosuvastatin (CRESTOR) 10 MG tablet Take 1 tablet (10 mg total) by mouth daily. 10/28/20   Sharion Balloon, FNP     Family History  Problem Relation Age of Onset   Diabetes Mother    Depression Mother    Hypertension Mother    Lung cancer Father    Hypertension Sister    Hypertension Sister    Thyroid disease Maternal Aunt     Social History   Socioeconomic History   Marital status: Significant Other    Spouse name: Not on file   Number of children: 0   Years of education: Not on file   Highest education level: Not on file  Occupational History   Not on file  Tobacco Use   Smoking status: Never   Smokeless tobacco: Never  Vaping Use   Vaping Use: Never used  Substance and Sexual Activity   Alcohol use: Not on file    Comment: occ   Drug use: Not Currently   Sexual activity: Yes    Birth control/protection: Post-menopausal  Other Topics Concern   Not on file  Social History Narrative   Patient  is a retired Naval architect.  She is a widower and notes that she moved from the [place] area after being reunited with a high school sweetheart whom she is now engaged to.   She lives locally in [place].   No children.   Social Determinants of Health   Financial Resource Strain: Not on file  Food Insecurity: Not on file  Transportation Needs: Not on file  Physical Activity: Not on file  Stress: Not on file  Social Connections: Not on file     Review of Systems: A 12 point ROS discussed and pertinent positives are indicated in the HPI above.  All other systems are negative.  Review of Systems  Constitutional:  Negative for fatigue and fever.   Respiratory:  Negative for cough and shortness of breath.   Cardiovascular:  Negative for chest pain.  Gastrointestinal:  Negative for abdominal pain.  Musculoskeletal:  Negative for back pain.  Psychiatric/Behavioral:  Negative for behavioral problems and confusion.    Vital Signs: BP (!) 155/96 (BP Location: Right Arm)   Pulse 76   Temp 98.5 F (36.9 C) (Oral)   Resp 18   Ht 5\' 6"  (1.676 m)   Wt 174 lb (78.9 kg)   LMP  (LMP Unknown)   SpO2 98%   BMI 28.08 kg/m   Physical Exam Vitals and nursing note reviewed.  Constitutional:      General: She is not in acute distress.    Appearance: Normal appearance. She is not ill-appearing.  HENT:     Mouth/Throat:     Mouth: Mucous membranes are moist.     Pharynx: Oropharynx is clear.  Cardiovascular:     Rate and Rhythm: Normal rate and regular rhythm.  Pulmonary:     Effort: Pulmonary effort is normal. No respiratory distress.     Breath sounds: Normal breath sounds.  Musculoskeletal:     Cervical back: Normal range of motion and neck supple. No tenderness.  Skin:    General: Skin is warm and dry.  Neurological:     General: No focal deficit present.     Mental Status: She is alert and oriented to person, place, and time. Mental status is at baseline.  Psychiatric:        Mood and Affect: Mood normal.        Behavior: Behavior normal.        Thought Content: Thought content normal.        Judgment: Judgment normal.     MD Evaluation Airway: WNL Heart: WNL Abdomen: WNL Chest/ Lungs: WNL ASA  Classification: 3 Mallampati/Airway Score: Two   Imaging: DG [facility] DEXA  Result Date: 10/22/2020 CLINICAL DATA:  Postmenopausal. No previous bone building therapy. EXAM: DUAL X-RAY ABSORPTIOMETRY (DXA) FOR BONE MINERAL DENSITY TECHNIQUE: Bone mineral density measurements are performed of the spine, hip, and forearm, as appropriate, per International Society of Clinical Densitometry recommendations. The pertinent regions of  interest are reported below. Non-contributory values are not reported. Images are obtained for bone mineral density measurement and are not obtained for diagnostic purposes. FINDINGS: AP LUMBAR SPINE L1-L4 Bone Mineral Density (BMD):  0.976 g/cm2 Young Adult T-Score:  -1.8 Z-Score:  -0.7 LEFT FEMUR neck Bone Mineral Density (BMD):  0.665 g/cm2 Young Adult T-Score: -2.7 Z-Score:  -1.5 Unit: This study was performed at [facility] on a [place]. Scan quality: None ASSESSMENT: Patient's diagnostic category is OSTEOPOROSIS by WHO Criteria. FRACTURE RISK: INCREASED FRAX: World Health Organization FRAX assessment of  absolute fracture risk is not calculated for this patient because the patient has T-score at or below -2.5. COMPARISON: None. RECOMMENDATIONS 1. All patients should optimize calcium and vitamin D intake. 2. Consider FDA-approved medical therapies in postmenopausal women and men aged 64 years and older, based on the following: - A hip or vertebral (clinical or morphometric) fracture - T-score less than or equal to -2.5 at the femoral neck or spine after appropriate evaluation to exclude secondary causes - Low bone mass (T-score between -1.0 and -2.5 at the femoral neck or spine) and a 10-year probability of a hip fracture greater than or equal to 3% or a 10-year probability of a major osteoporosis-related fracture greater than or equal to 20% based on the [place]-adapted WHO algorithm - Clinician judgment and/or patient preferences may indicate treatment for people with 10-year fracture probabilities above or below these levels 3. Patients with diagnosis of osteoporosis or at high risk for fracture should have regular bone mineral density tests. For patients eligible for Medicare, routine testing is allowed once every 2 years. The testing frequency can be increased to one year for patients who have rapidly progressing disease, those who are receiving or discontinuing medical therapy  to restore bone mass, or have additional risk factors. Electronically Signed   By: Nolon Nations M.D.   On: 10/22/2020 14:27    Labs:  CBC: Recent Labs    10/21/20 1137 11/02/20 0743  WBC 7.1 7.0  HGB 13.8 13.4  HCT 41.3 40.0  PLT 302 277    COAGS: No results for input(s): INR, APTT in the last 8760 hours.  BMP: Recent Labs    06/14/20 1358 08/02/20 1109 10/21/20 1137  NA 138  --  141  K 4.4  --  4.9  CL 100  --  105  CO2 22  --  23  GLUCOSE 86  --  96  BUN 15  --  15  CALCIUM 10.5* 9.7 10.2  CREATININE 0.75  --  0.68  GFRNONAA 84  --   --   GFRAA 97  --   --     LIVER FUNCTION TESTS: Recent Labs    10/21/20 1137  BILITOT 0.2  AST 21  ALT 24  ALKPHOS 114  PROT 7.4  ALBUMIN 4.6    TUMOR MARKERS: No results for input(s): AFPTM, CEA, CA199, CHROMGRNA in the last 8760 hours.  Assessment and Plan: Patient with past medical history of right neck mass presents for biopsy at the request of Dr. Benjamine Mola. Case reviewed by Dr. Serafina Royals who approves patient for procedure.  Patient presents today in their usual state of health.  She has been NPO and is not currently on blood thinners.   Risks and benefits was discussed with the patient and/or patient's family including, but not limited to bleeding, infection, damage to adjacent structures or low yield requiring additional tests.  All of the questions were answered and there is agreement to proceed.  Consent signed and in chart.  Thank you for this interesting consult.  I greatly enjoyed meeting Simmone Seith and look forward to participating in their care.  A copy of this report was sent to the requesting provider on this date.  Electronically Signed: Docia Barrier, PA 11/02/2020, 8:25 AM   I spent a total of  30 Minutes   in face to face in clinical consultation, greater than 50% of which was counseling/coordinating care for neck mass.

## 2020-11-02 ENCOUNTER — Ambulatory Visit (HOSPITAL_COMMUNITY)
Admission: RE | Admit: 2020-11-02 | Discharge: 2020-11-02 | Disposition: A | Discharge status: Home / Self Care | Payer: Medicare Other | Source: Ambulatory Visit | Attending: Otolaryngology | Admitting: Otolaryngology

## 2020-11-02 ENCOUNTER — Encounter (HOSPITAL_COMMUNITY): Payer: Self-pay

## 2020-11-02 ENCOUNTER — Other Ambulatory Visit: Payer: Self-pay

## 2020-11-02 DIAGNOSIS — E041 Nontoxic single thyroid nodule: Secondary | ICD-10-CM

## 2020-11-02 DIAGNOSIS — R221 Localized swelling, mass and lump, neck: Secondary | ICD-10-CM | POA: Insufficient documentation

## 2020-11-02 DIAGNOSIS — E079 Disorder of thyroid, unspecified: Secondary | ICD-10-CM

## 2020-11-02 LAB — CBC WITH DIFFERENTIAL/PLATELET
Abs Immature Granulocytes: 0.03 10*3/uL (ref 0.00–0.07)
Basophils Absolute: 0.1 10*3/uL (ref 0.0–0.1)
Basophils Relative: 1 %
Eosinophils Absolute: 0.3 10*3/uL (ref 0.0–0.5)
Eosinophils Relative: 4 %
HCT: 40 % (ref 36.0–46.0)
Hemoglobin: 13.4 g/dL (ref 12.0–15.0)
Immature Granulocytes: 0 %
Lymphocytes Relative: 26 %
Lymphs Abs: 1.8 10*3/uL (ref 0.7–4.0)
MCH: 30.4 pg (ref 26.0–34.0)
MCHC: 33.5 g/dL (ref 30.0–36.0)
MCV: 90.7 fL (ref 80.0–100.0)
Monocytes Absolute: 0.5 10*3/uL (ref 0.1–1.0)
Monocytes Relative: 7 %
Neutro Abs: 4.4 10*3/uL (ref 1.7–7.7)
Neutrophils Relative %: 62 %
Platelets: 277 10*3/uL (ref 150–400)
RBC: 4.41 MIL/uL (ref 3.87–5.11)
RDW: 12.6 % (ref 11.5–15.5)
WBC: 7 10*3/uL (ref 4.0–10.5)
nRBC: 0 % (ref 0.0–0.2)

## 2020-11-02 MED ORDER — MIDAZOLAM HCL 2 MG/2ML IJ SOLN
INTRAMUSCULAR | Status: AC | PRN
  Administered 2020-11-02: 0.5 mg via INTRAVENOUS
  Administered 2020-11-02: 1 mg via INTRAVENOUS
  Administered 2020-11-02: 0.5 mg via INTRAVENOUS

## 2020-11-02 MED ORDER — SODIUM CHLORIDE 0.9 % IV SOLN: INTRAVENOUS | Status: DC

## 2020-11-02 MED ORDER — FENTANYL CITRATE (PF) 100 MCG/2ML IJ SOLN
INTRAMUSCULAR | Status: AC | PRN
  Administered 2020-11-02 (×3): 25 ug via INTRAVENOUS

## 2020-11-02 MED ORDER — MIDAZOLAM HCL 2 MG/2ML IJ SOLN
INTRAMUSCULAR | Status: AC
  Filled 2020-11-02: qty 2

## 2020-11-02 MED ORDER — FENTANYL CITRATE (PF) 100 MCG/2ML IJ SOLN
INTRAMUSCULAR | Status: AC
  Filled 2020-11-02: qty 2

## 2020-11-02 MED ORDER — LIDOCAINE HCL (PF) 1 % IJ SOLN
INTRAMUSCULAR | Status: AC
  Filled 2020-11-02: qty 30

## 2020-11-02 NOTE — Procedures (Signed)
Pre Procedure Dx: Indeterminate right neck nodule Post Procedural Dx: Same  Technically successful US guided biopsy of indeterminate right neck nodule.   EBL: None  No immediate complications.   Ronny Bacon, MD Pager #: [phone]

## 2020-11-02 NOTE — Sedation Documentation (Signed)
Site is clean, warm and dry. No drainage from dressing.

## 2020-11-02 NOTE — Progress Notes (Signed)
Pt will take tylenol at home/ using ice pack with good pain management

## 2020-11-03 ENCOUNTER — Ambulatory Visit (INDEPENDENT_AMBULATORY_CARE_PROVIDER_SITE_OTHER): Payer: Medicare Other | Admitting: Pharmacist

## 2020-11-03 DIAGNOSIS — M810 Age-related osteoporosis without current pathological fracture: Secondary | ICD-10-CM | POA: Diagnosis not present

## 2020-11-03 LAB — CYTOLOGY - NON PAP

## 2020-11-03 MED ORDER — ALENDRONATE SODIUM 70 MG PO TABS: 70.0000 mg | ORAL_TABLET | ORAL | 11 refills | Status: DC

## 2020-11-03 NOTE — Progress Notes (Signed)
      11/03/2020 Name: Courtney White MRN: [id number] DOB: 05/21/1955   S:  72 yoF Presents for osteoporosis evaluation, education, and management  Current Height:  5'6"      Max Lifetime Height:  5'6" Current Weight:   174 lb     Ethnicity:Caucasian   HPI: Osteoporosis?  Yes  Back Pain?  No       Kyphosis?  No Prior fracture?  No Med(s) for Osteoporosis/Osteopenia:  vitamin D Med(s) previously tried for Osteoporosis/Osteopenia:  n/a                                                             PMH:  HRT? No Steroid Use?  No Thyroid med?  Yes History of cancer?  No History of digestive disorders (ie Crohn's)?  No Current or previous eating disorders?  No Last Vitamin D Result:  39.5 Last GFR Result:  96 (2022)   FH/SH: Family history of osteoporosis?  No Parent with history of hip fracture?  No Family history of breast cancer?  No Exercise?  No Smoking?  No Alcohol?  No    DEXA Results FINDINGS: AP LUMBAR SPINE L1-L4 Bone Mineral Density (BMD):  0.976 g/cm2 Young Adult T-Score:  -1.8  Z-Score:  -0.7   LEFT FEMUR neck Bone Mineral Density (BMD):  0.665 g/cm2 Young Adult T-Score: -2.7 Z-Score:  -1.5   ASSESSMENT: Patient's diagnostic category is OSTEOPOROSIS by WHO Criteria.   FRACTURE RISK: INCREASED   FRAX: World Health Organization FRAX assessment of absolute fracture risk is not calculated for this patient because the patient has T-score at or below -2.5.   COMPARISON: None.   Recommendations: 1.  Start  alendronate (FOSAMAX)  2.  recommend calcium 1200mg  daily through supplementation or diet. Continue vitamin D 3.  continue weight bearing exercise - 30 minutes at least 4 days per week.   4.  Counseled and educated about fall risk and prevention.  Recheck DEXA:  2 years  Time spent counseling patient:  15 minutes   Regina Eck, PharmD, BCPS Clinical Pharmacist, [place]  [facility] Phone  [phone]

## 2020-11-07 LAB — SURGICAL PATHOLOGY

## 2020-11-15 ENCOUNTER — Other Ambulatory Visit: Payer: Self-pay | Admitting: Otolaryngology

## 2020-12-02 ENCOUNTER — Ambulatory Visit (INDEPENDENT_AMBULATORY_CARE_PROVIDER_SITE_OTHER): Payer: Medicare Other | Admitting: Nurse Practitioner

## 2020-12-02 ENCOUNTER — Encounter: Payer: Self-pay | Admitting: Nurse Practitioner

## 2020-12-02 DIAGNOSIS — U071 COVID-19: Secondary | ICD-10-CM | POA: Diagnosis not present

## 2020-12-02 HISTORY — DX: COVID-19: U07.1

## 2020-12-02 MED ORDER — MOLNUPIRAVIR EUA 200MG CAPSULE: 4.0000 | ORAL_CAPSULE | Freq: Two times a day (BID) | ORAL | 0 refills | Status: AC

## 2020-12-02 MED ORDER — BENZONATATE 100 MG PO CAPS: 100.0000 mg | ORAL_CAPSULE | Freq: Three times a day (TID) | ORAL | 0 refills | Status: DC | PRN

## 2020-12-02 NOTE — Progress Notes (Signed)
Virtual Visit  Note Due to COVID-19 pandemic this visit was conducted virtually. This visit type was conducted due to national recommendations for restrictions regarding the COVID-19 Pandemic (e.g. social distancing, sheltering in place) in an effort to limit this patient's exposure and mitigate transmission in our community. All issues noted in this document were discussed and addressed.  A physical exam was not performed with this format.  I connected with Courtney White on 12/02/20 at 3:05 by telephone and verified that I am speaking with the correct person using two identifiers. Courtney White is currently located at home and her son is currently with her during visit. The provider, Mary-Margaret Hassell Done, FNP is located in their office at time of visit.  I discussed the limitations, risks, security and privacy concerns of performing an evaluation and management service by telephone and the availability of in person appointments. I also discussed with the patient that there may be a patient responsible charge related to this service. The patient expressed understanding and agreed to proceed.   History and Present Illness:   Chief Complaint: covid  HPI Patient says on Wednesday night she developed a scratchy throat with slight cough., could not get anything up she tried robitussin with no help. As days have worn on she has developed a fever, body ache, and fatigue. She tested positive for covid this morning. She has ost her taste this afternoon.    Review of Systems  Constitutional:  Positive for chills, fever and malaise/fatigue.  HENT:  Positive for congestion, sinus pain and sore throat.   Respiratory:  Positive for cough and sputum production. Negative for shortness of breath.   Musculoskeletal:  Positive for myalgias.  Neurological:  Positive for headaches. Negative for dizziness.    Observations/Objective: Alert and oriented- answers all questions appropriately No  distress Voice hoarse deep cough  Assessment and Plan: [name] in today with chief complaint of Covid Positive   1. Lab test positive for detection of COVID-19 virus 1. Take meds as prescribed 2. Use a cool mist humidifier especially during the winter months and when heat has been humid. 3. Use saline nose sprays frequently 4. Saline irrigations of the nose can be very helpful if done frequently.  * 4X daily for 1 week*  * Use of a nettie pot can be helpful with this. Follow directions with this* 5. Drink plenty of fluids 6. Keep thermostat turn down low 7.For any cough or congestion  Use plain Mucinex- regular strength or max strength is fine   * Children- consult with Pharmacist for dosing 8. For fever or aces or pains- take tylenol or ibuprofen appropriate for age and weight.  * for fevers greater than 101 orally you may alternate ibuprofen and tylenol every  3 hours.    - molnupiravir EUA 200 mg CAPS; Take 4 capsules (800 mg total) by mouth 2 (two) times daily for 5 days.  Dispense: 40 capsule; Refill: 0 - benzonatate (TESSALON PERLES) 100 MG capsule; Take 1 capsule (100 mg total) by mouth 3 (three) times daily as needed for cough.  Dispense: 20 capsule; Refill: 0    Follow Up Instructions: prn    I discussed the assessment and treatment plan with the patient. The patient was provided an opportunity to ask questions and all were answered. The patient agreed with the plan and demonstrated an understanding of the instructions.   The patient was advised to call back or seek an in-person evaluation if the symptoms worsen or if  the condition fails to improve as anticipated.  The above assessment and management plan was discussed with the patient. The patient verbalized understanding of and has agreed to the management plan. Patient is aware to call the clinic if symptoms persist or worsen. Patient is aware when to return to the clinic for a follow-up visit. Patient  educated on when it is appropriate to go to the emergency department.   Time call ended:  3:16  I provided 11 minutes of  non face-to-face time during this encounter.    Mary-Margaret Hassell Done, FNP

## 2020-12-05 ENCOUNTER — Encounter: Payer: Self-pay | Admitting: Family Medicine

## 2020-12-05 ENCOUNTER — Ambulatory Visit (INDEPENDENT_AMBULATORY_CARE_PROVIDER_SITE_OTHER): Payer: Medicare Other | Admitting: Family Medicine

## 2020-12-05 VITALS — BP 146/93 | HR 86 | Temp 97.9°F

## 2020-12-05 DIAGNOSIS — U071 COVID-19: Secondary | ICD-10-CM | POA: Diagnosis not present

## 2020-12-05 DIAGNOSIS — R059 Cough, unspecified: Secondary | ICD-10-CM | POA: Diagnosis not present

## 2020-12-05 MED ORDER — PREDNISONE 20 MG PO TABS: 20.0000 mg | ORAL_TABLET | Freq: Every day | ORAL | 0 refills | Status: AC

## 2020-12-05 MED ORDER — PSEUDOEPH-BROMPHEN-DM 30-2-10 MG/5ML PO SYRP: 5.0000 mL | ORAL_SOLUTION | Freq: Four times a day (QID) | ORAL | 0 refills | Status: DC | PRN

## 2020-12-05 NOTE — Progress Notes (Signed)
Acute Office Visit  Subjective:    Patient ID: Courtney White, female    DOB: 1956/04/18, 65 y.o.   MRN: [id number]  Chief Complaint  Patient presents with   Covid Positive    HPI Patient is in today for a cough. She tested positive for Covid 3 days ago. Her symptoms started 5 days ago. She reports difficulty sleeping due to the cough. She has tried robitussin and tessalon perles without improvement. She reports that she feels great except for the continued cough and congestion. Denies shortness of breath, chest pain, or fever.   Past Medical History:  Diagnosis Date   Hepatitis C 1992   s/p treatment.  she has cleared virus   HTN (hypertension)    Thyroid disease    Vitiligo     Past Surgical History:  Procedure Laterality Date   TONSILLECTOMY AND ADENOIDECTOMY     UTERINE FIBROID SURGERY      Family History  Problem Relation Age of Onset   Diabetes Mother    Depression Mother    Hypertension Mother    Lung cancer Father    Hypertension Sister    Hypertension Sister    Thyroid disease Maternal Aunt     Social History   Socioeconomic History   Marital status: Significant Other    Spouse name: Not on file   Number of children: 0   Years of education: Not on file   Highest education level: Not on file  Occupational History   Not on file  Tobacco Use   Smoking status: Never   Smokeless tobacco: Never  Vaping Use   Vaping Use: Never used  Substance and Sexual Activity   Alcohol use: Not on file    Comment: occ   Drug use: Not Currently   Sexual activity: Yes    Birth control/protection: Post-menopausal  Other Topics Concern   Not on file  Social History Narrative   Patient is a retired [employer] architect.  She is a widower and notes that she moved from the [place] area after being reunited with a high school sweetheart whom she is now engaged to.   She lives locally in [place].   No children.   Social Determinants of Health   Financial Resource  Strain: Not on file  Food Insecurity: Not on file  Transportation Needs: Not on file  Physical Activity: Not on file  Stress: Not on file  Social Connections: Not on file  Intimate Partner Violence: Not on file    Outpatient Medications Prior to Visit  Medication Sig Dispense Refill   alendronate (FOSAMAX) 70 MG tablet Take 1 tablet (70 mg total) by mouth every 7 (seven) days. Take with a full glass of water on an empty stomach. 4 tablet 11   amLODipine (NORVASC) 5 MG tablet Take 1 tablet (5 mg total) by mouth daily. (Patient taking differently: Take 5 mg by mouth every evening.) 90 tablet 3   benzonatate (TESSALON PERLES) 100 MG capsule Take 1 capsule (100 mg total) by mouth 3 (three) times daily as needed for cough. 20 capsule 0   Carboxymethylcellul-Glycerin (LUBRICATING EYE DROPS OP) Place 1 drop into both eyes daily as needed (dry eyes).     fluticasone (FLONASE) 50 MCG/ACT nasal spray Place 1 spray into both nostrils daily.     levothyroxine (SYNTHROID) 125 MCG tablet Take 1 tablet (125 mcg total) by mouth daily. (Patient taking differently: Take 125 mcg by mouth daily before breakfast.) 90 tablet 3   Menthol,  Topical Analgesic, (ICY HOT) 16 % LIQD Apply 1 application topically daily as needed (pain).     molnupiravir EUA 200 mg CAPS Take 4 capsules (800 mg total) by mouth 2 (two) times daily for 5 days. 40 capsule 0   rosuvastatin (CRESTOR) 10 MG tablet Take 1 tablet (10 mg total) by mouth daily. 90 tablet 3   Vitamin D, Ergocalciferol, (DRISDOL) 1.25 MG (50000 UNIT) CAPS capsule Take 50,000 Units by mouth every Monday.     ibuprofen (ADVIL) 200 MG tablet Take 400-600 mg by mouth every 6 (six) hours as needed for moderate pain. (Patient not taking: Reported on 12/05/2020)     No facility-administered medications prior to visit.    No Known Allergies  Review of Systems As per HPI.     Objective:    Physical Exam Vitals and nursing note reviewed.  Constitutional:      General:  She is not in acute distress.    Appearance: She is not ill-appearing, toxic-appearing or diaphoretic.  HENT:     Nose: Congestion present.  Cardiovascular:     Rate and Rhythm: Normal rate and regular rhythm.     Heart sounds: Normal heart sounds. No murmur heard. Pulmonary:     Effort: Pulmonary effort is normal. No respiratory distress.     Breath sounds: Normal breath sounds.  Neurological:     General: No focal deficit present.     Mental Status: She is alert and oriented to person, place, and time.  Psychiatric:        Mood and Affect: Mood normal.        Behavior: Behavior normal.    BP (!) 146/93   Pulse 86   Temp 97.9 F (36.6 C) (Temporal)   LMP  (LMP Unknown)   SpO2 98%  Wt Readings from Last 3 Encounters:  11/02/20 174 lb (78.9 kg)  10/21/20 175 lb 12.8 oz (79.7 kg)  07/13/20 178 lb 3.2 oz (80.8 kg)    Health Maintenance Due  Topic Date Due   COLONOSCOPY (Pts 45-18yrs Insurance coverage will need to be confirmed)  Never done   Zoster Vaccines- Shingrix (1 of 2) Never done   COVID-19 Vaccine (4 - Booster for Moderna series) 08/02/2020   PNA vac Low Risk Adult (2 of 2 - PPSV23) 10/18/2020   INFLUENZA VACCINE  11/21/2020    There are no preventive care reminders to display for this patient.   Lab Results  Component Value Date   TSH 1.380 10/21/2020   Lab Results  Component Value Date   WBC 7.0 11/02/2020   HGB 13.4 11/02/2020   HCT 40.0 11/02/2020   MCV 90.7 11/02/2020   PLT 277 11/02/2020   Lab Results  Component Value Date   NA 141 10/21/2020   K 4.9 10/21/2020   CO2 23 10/21/2020   GLUCOSE 96 10/21/2020   BUN 15 10/21/2020   CREATININE 0.68 10/21/2020   BILITOT 0.2 10/21/2020   ALKPHOS 114 10/21/2020   AST 21 10/21/2020   ALT 24 10/21/2020   PROT 7.4 10/21/2020   ALBUMIN 4.6 10/21/2020   CALCIUM 10.2 10/21/2020   EGFR 96 10/21/2020   Lab Results  Component Value Date   CHOL 198 10/21/2020   Lab Results  Component Value Date    HDL 52 10/21/2020   Lab Results  Component Value Date   LDLCALC 120 (H) 10/21/2020   Lab Results  Component Value Date   TRIG 150 (H) 10/21/2020   Lab Results  Component Value Date   CHOLHDL 3.8 10/21/2020   Lab Results  Component Value Date   HGBA1C 5.8 03/31/2019       Assessment & Plan:   Courtney White was seen today for covid positive.  Diagnoses and all orders for this visit:  COVID-19 Cough Covid x 5 days. She has been taking antiviral therapy. She has been feeling better except for cough. Prednisone burst as below. Brom-phed also ordered.  -     brompheniramine-pseudoephedrine-DM 30-2-10 MG/5ML syrup; Take 5 mLs by mouth 4 (four) times daily as needed. -     predniSONE (DELTASONE) 20 MG tablet; Take 1 tablet (20 mg total) by mouth daily with breakfast for 5 days.  Return to office for new or worsening symptoms, or if symptoms persist.   The patient indicates understanding of these issues and agrees with the plan.  Gwenlyn Perking, FNP

## 2020-12-08 ENCOUNTER — Encounter (HOSPITAL_BASED_OUTPATIENT_CLINIC_OR_DEPARTMENT_OTHER): Payer: Self-pay | Admitting: Otolaryngology

## 2020-12-12 ENCOUNTER — Encounter (HOSPITAL_BASED_OUTPATIENT_CLINIC_OR_DEPARTMENT_OTHER)
Admission: RE | Admit: 2020-12-12 | Discharge: 2020-12-12 | Disposition: A | Discharge status: Home / Self Care | Payer: Medicare Other | Source: Ambulatory Visit | Attending: Otolaryngology | Admitting: Otolaryngology

## 2020-12-12 ENCOUNTER — Encounter: Payer: Self-pay | Admitting: Family Medicine

## 2020-12-12 ENCOUNTER — Ambulatory Visit (INDEPENDENT_AMBULATORY_CARE_PROVIDER_SITE_OTHER): Payer: Medicare Other | Admitting: Family Medicine

## 2020-12-12 ENCOUNTER — Other Ambulatory Visit: Payer: Self-pay

## 2020-12-12 VITALS — BP 150/99 | HR 83 | Temp 97.8°F | Ht 66.0 in | Wt 173.0 lb

## 2020-12-12 DIAGNOSIS — Z01818 Encounter for other preprocedural examination: Secondary | ICD-10-CM | POA: Diagnosis not present

## 2020-12-12 DIAGNOSIS — E039 Hypothyroidism, unspecified: Secondary | ICD-10-CM

## 2020-12-12 DIAGNOSIS — D351 Benign neoplasm of parathyroid gland: Secondary | ICD-10-CM

## 2020-12-12 LAB — BASIC METABOLIC PANEL
Anion gap: 8 (ref 5–15)
BUN: 16 mg/dL (ref 8–23)
CO2: 26 mmol/L (ref 22–32)
Calcium: 9.6 mg/dL (ref 8.9–10.3)
Chloride: 105 mmol/L (ref 98–111)
Creatinine, Ser: 0.59 mg/dL (ref 0.44–1.00)
GFR, Estimated: >60 mL/min (ref >60)
Glucose, Bld: 99 mg/dL (ref 70–99)
Potassium: 4.6 mmol/L (ref 3.5–5.1)
Sodium: 139 mmol/L (ref 135–145)

## 2020-12-12 NOTE — Patient Instructions (Signed)
Come in for labs when able post op

## 2020-12-12 NOTE — Progress Notes (Signed)
Subjective: CC: Hypothyroidism PCP: Courtney Norlander, DO Courtney White White a 65 y.o. female presenting to clinic today for:  1.  Hypothyroidism/parathyroid lesion [name] compliant with Synthroid 125 mcg daily.  She has a partial thyroidectomy for the right side of her thyroid scheduled with Dr. Benjamine Mola on Friday.  She had her preop evaluation and labs done today.  She notes that breathing in general state of health has been essentially stable despite having had COVID-19 about 2 weeks ago.       ROS: Per HPI  No Known Allergies Past Medical History:  Diagnosis Date   COVID-19 12/02/2020   Hepatitis C 1992   s/p treatment.  she has cleared virus   HTN (hypertension)    Thyroid disease    Vitiligo     Current Outpatient Medications:    alendronate (FOSAMAX) 70 MG tablet, Take 1 tablet (70 mg total) by mouth every 7 (seven) days. Take with a full glass of water on an empty stomach., Disp: 4 tablet, Rfl: 11   amLODipine (NORVASC) 5 MG tablet, Take 1 tablet (5 mg total) by mouth daily. (Patient taking differently: Take 5 mg by mouth every evening.), Disp: 90 tablet, Rfl: 3   Carboxymethylcellul-Glycerin (LUBRICATING EYE DROPS OP), Place 1 drop into both eyes daily as needed (dry eyes)., Disp: , Rfl:    fluticasone (FLONASE) 50 MCG/ACT nasal spray, Place 1 spray into both nostrils daily., Disp: , Rfl:    levothyroxine (SYNTHROID) 125 MCG tablet, Take 1 tablet (125 mcg total) by mouth daily. (Patient taking differently: Take 125 mcg by mouth daily before breakfast.), Disp: 90 tablet, Rfl: 3   Menthol, Topical Analgesic, (ICY HOT) 16 % LIQD, Apply 1 application topically daily as needed (pain)., Disp: , Rfl:    rosuvastatin (CRESTOR) 10 MG tablet, Take 1 tablet (10 mg total) by mouth daily., Disp: 90 tablet, Rfl: 3   Vitamin D, Ergocalciferol, (DRISDOL) 1.25 MG (50000 UNIT) CAPS capsule, Take 50,000 Units by mouth every Monday., Disp: , Rfl:    brompheniramine-pseudoephedrine-DM  30-2-10 MG/5ML syrup, Take 5 mLs by mouth 4 (four) times daily as needed. (Patient not taking: Reported on 12/12/2020), Disp: 118 mL, Rfl: 0 Social History   Socioeconomic History   Marital status: Significant Other    Spouse name: Not on file   Number of children: 0   Years of education: Not on file   Highest education level: Not on file  Occupational History   Not on file  Tobacco Use   Smoking status: Never   Smokeless tobacco: Never  Vaping Use   Vaping Use: Never used  Substance and Sexual Activity   Alcohol use: Not on file    Comment: occ   Drug use: Not Currently   Sexual activity: Yes    Birth control/protection: Post-menopausal  Other Topics Concern   Not on file  Social History Narrative   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  [name] a widower and notes that she moved from the [place] area after being reunited with a high school sweetheart whom [name] now engaged to.   She lives locally in [place].   No children.   Social Determinants of Health   Financial Resource Strain: Not on file  Food Insecurity: Not on file  Transportation Needs: Not on file  Physical Activity: Not on file  Stress: Not on file  Social Connections: Not on file  Intimate Partner Violence: Not on file   Family History  Problem Relation  Age of Onset   Diabetes Mother    Depression Mother    Hypertension Mother    Lung cancer Father    Hypertension Sister    Hypertension Sister    Thyroid disease Maternal Aunt     Objective: Office vital signs reviewed. BP (!) 150/99   Pulse 83   Temp 97.8 F (36.6 C)   Ht _0  (1.676 m)   Wt 173 lb (78.5 kg)   LMP  (LMP Unknown)   SpO2 98%   BMI 27.92 kg/m   Physical Examination:  General: Awake, alert, well nourished, No acute distress HEENT: Normal; sclera white.  No exophthalmos Cardio: regular rate and rhythm, S1S2 heard, no murmurs appreciated Pulm: clear to auscultation bilaterally, no wheezes, rhonchi or rales; normal work  of breathing on room air Neuro: No tremor  Assessment/ Plan: 65 y.o. female   Acquired hypothyroidism - Plan: TSH, T4, free  Parathyroid adenoma - Plan: CMP14+EGFR  TSH, free T4 and CMP have been ordered.  Would like her to have these done roughly 2 to 3 weeks following her hemithyroidectomy.  For now continue current regimen  No orders of the defined types were placed in this encounter.  No orders of the defined types were placed in this encounter.    Courtney Norlander, DO [place] [phone]

## 2020-12-16 ENCOUNTER — Encounter (HOSPITAL_BASED_OUTPATIENT_CLINIC_OR_DEPARTMENT_OTHER)
Admission: RE | Disposition: A | Discharge status: Home / Self Care | Payer: Self-pay | Source: Home / Self Care | Attending: Otolaryngology

## 2020-12-16 ENCOUNTER — Encounter (HOSPITAL_BASED_OUTPATIENT_CLINIC_OR_DEPARTMENT_OTHER): Payer: Self-pay | Admitting: Otolaryngology

## 2020-12-16 ENCOUNTER — Ambulatory Visit (HOSPITAL_BASED_OUTPATIENT_CLINIC_OR_DEPARTMENT_OTHER): Payer: Medicare Other | Admitting: Certified Registered"

## 2020-12-16 ENCOUNTER — Other Ambulatory Visit: Payer: Self-pay

## 2020-12-16 ENCOUNTER — Ambulatory Visit (HOSPITAL_BASED_OUTPATIENT_CLINIC_OR_DEPARTMENT_OTHER)
Admission: RE | Admit: 2020-12-16 | Discharge: 2020-12-17 | Disposition: A | Discharge status: Home / Self Care | Payer: Medicare Other | Attending: Otolaryngology | Admitting: Otolaryngology

## 2020-12-16 DIAGNOSIS — E042 Nontoxic multinodular goiter: Secondary | ICD-10-CM | POA: Insufficient documentation

## 2020-12-16 DIAGNOSIS — E890 Postprocedural hypothyroidism: Secondary | ICD-10-CM | POA: Diagnosis present

## 2020-12-16 DIAGNOSIS — R5382 Chronic fatigue, unspecified: Secondary | ICD-10-CM | POA: Insufficient documentation

## 2020-12-16 DIAGNOSIS — E063 Autoimmune thyroiditis: Secondary | ICD-10-CM | POA: Insufficient documentation

## 2020-12-16 HISTORY — PX: THYROIDECTOMY: SHX17

## 2020-12-16 SURGERY — THYROIDECTOMY
Anesthesia: General | Site: Neck | Laterality: Right

## 2020-12-16 MED ORDER — ACETAMINOPHEN 500 MG PO TABS: 1000.0000 mg | ORAL_TABLET | Freq: Once | ORAL | Status: DC

## 2020-12-16 MED ORDER — PHENYLEPHRINE 40 MCG/ML (10ML) SYRINGE FOR IV PUSH (FOR BLOOD PRESSURE SUPPORT)
PREFILLED_SYRINGE | INTRAVENOUS | Status: AC
  Filled 2020-12-16: qty 10

## 2020-12-16 MED ORDER — ROSUVASTATIN CALCIUM 5 MG PO TABS
10.0000 mg | ORAL_TABLET | Freq: Every day | ORAL | Status: DC
  Administered 2020-12-16: 10 mg via ORAL
  Filled 2020-12-16: qty 1
  Filled 2020-12-16 (×2): qty 2

## 2020-12-16 MED ORDER — DEXAMETHASONE SODIUM PHOSPHATE 10 MG/ML IJ SOLN
INTRAMUSCULAR | Status: AC
  Filled 2020-12-16: qty 1

## 2020-12-16 MED ORDER — 0.9 % SODIUM CHLORIDE (POUR BTL) OPTIME
TOPICAL | Status: DC | PRN
  Administered 2020-12-16: 120 mL

## 2020-12-16 MED ORDER — OXYCODONE-ACETAMINOPHEN 5-325 MG PO TABS
1.0000 | ORAL_TABLET | ORAL | Status: DC | PRN
  Administered 2020-12-16: 1 via ORAL
  Filled 2020-12-16: qty 1

## 2020-12-16 MED ORDER — PROPOFOL 10 MG/ML IV BOLUS
INTRAVENOUS | Status: AC
  Filled 2020-12-16: qty 20

## 2020-12-16 MED ORDER — ONDANSETRON HCL 4 MG/2ML IJ SOLN
INTRAMUSCULAR | Status: DC | PRN
  Administered 2020-12-16: 4 mg via INTRAVENOUS

## 2020-12-16 MED ORDER — LACTATED RINGERS IV SOLN: INTRAVENOUS | Status: DC

## 2020-12-16 MED ORDER — CEFAZOLIN SODIUM-DEXTROSE 2-4 GM/100ML-% IV SOLN
INTRAVENOUS | Status: AC
  Filled 2020-12-16: qty 100

## 2020-12-16 MED ORDER — LIDOCAINE-EPINEPHRINE 1 %-1:100000 IJ SOLN
INTRAMUSCULAR | Status: DC | PRN
  Administered 2020-12-16: 3 mL

## 2020-12-16 MED ORDER — FENTANYL CITRATE (PF) 100 MCG/2ML IJ SOLN
INTRAMUSCULAR | Status: AC
  Filled 2020-12-16: qty 2

## 2020-12-16 MED ORDER — MIDAZOLAM HCL 2 MG/2ML IJ SOLN
INTRAMUSCULAR | Status: AC
  Filled 2020-12-16: qty 2

## 2020-12-16 MED ORDER — PROPOFOL 10 MG/ML IV BOLUS
INTRAVENOUS | Status: DC | PRN
  Administered 2020-12-16: 80 mg via INTRAVENOUS
  Administered 2020-12-16: 30 mg via INTRAVENOUS
  Administered 2020-12-16: 120 mg via INTRAVENOUS

## 2020-12-16 MED ORDER — DEXAMETHASONE SODIUM PHOSPHATE 10 MG/ML IJ SOLN
INTRAMUSCULAR | Status: DC | PRN
  Administered 2020-12-16: 10 mg via INTRAVENOUS

## 2020-12-16 MED ORDER — AMISULPRIDE (ANTIEMETIC) 5 MG/2ML IV SOLN: 10.0000 mg | Freq: Once | INTRAVENOUS | Status: DC | PRN

## 2020-12-16 MED ORDER — ACETAMINOPHEN 325 MG RE SUPP: 650.0000 mg | RECTAL | Status: DC | PRN

## 2020-12-16 MED ORDER — PHENYLEPHRINE HCL (PRESSORS) 10 MG/ML IV SOLN
INTRAVENOUS | Status: DC | PRN
  Administered 2020-12-16 (×2): 80 ug via INTRAVENOUS
  Administered 2020-12-16 (×2): 40 ug via INTRAVENOUS

## 2020-12-16 MED ORDER — FENTANYL CITRATE (PF) 100 MCG/2ML IJ SOLN
INTRAMUSCULAR | Status: DC | PRN
  Administered 2020-12-16 (×4): 50 ug via INTRAVENOUS

## 2020-12-16 MED ORDER — CEFAZOLIN SODIUM-DEXTROSE 2-3 GM-%(50ML) IV SOLR
INTRAVENOUS | Status: DC | PRN
  Administered 2020-12-16: 2 g via INTRAVENOUS

## 2020-12-16 MED ORDER — AMLODIPINE BESYLATE 5 MG PO TABS
5.0000 mg | ORAL_TABLET | Freq: Every evening | ORAL | Status: DC
  Administered 2020-12-16: 5 mg via ORAL
  Filled 2020-12-16: qty 1

## 2020-12-16 MED ORDER — LIDOCAINE HCL (PF) 2 % IJ SOLN
INTRAMUSCULAR | Status: AC
  Filled 2020-12-16: qty 5

## 2020-12-16 MED ORDER — ONDANSETRON HCL 4 MG/2ML IJ SOLN
INTRAMUSCULAR | Status: AC
  Filled 2020-12-16: qty 2

## 2020-12-16 MED ORDER — KCL IN DEXTROSE-NACL 20-5-0.45 MEQ/L-%-% IV SOLN
INTRAVENOUS | Status: DC
  Filled 2020-12-16: qty 1000

## 2020-12-16 MED ORDER — ACETAMINOPHEN 160 MG/5ML PO SOLN
650.0000 mg | ORAL | Status: DC | PRN
  Administered 2020-12-16: 325 mg via ORAL
  Administered 2020-12-17: 650 mg via ORAL
  Filled 2020-12-16 (×2): qty 20.3

## 2020-12-16 MED ORDER — MORPHINE SULFATE (PF) 4 MG/ML IV SOLN
2.0000 mg | INTRAVENOUS | Status: DC | PRN
  Administered 2020-12-16: 2 mg via INTRAVENOUS
  Filled 2020-12-16: qty 1

## 2020-12-16 MED ORDER — SUCCINYLCHOLINE CHLORIDE 200 MG/10ML IV SOSY
PREFILLED_SYRINGE | INTRAVENOUS | Status: DC | PRN
  Administered 2020-12-16: 100 mg via INTRAVENOUS

## 2020-12-16 MED ORDER — FENTANYL CITRATE (PF) 100 MCG/2ML IJ SOLN: 25.0000 ug | INTRAMUSCULAR | Status: DC | PRN

## 2020-12-16 MED ORDER — MIDAZOLAM HCL 5 MG/5ML IJ SOLN
INTRAMUSCULAR | Status: DC | PRN
  Administered 2020-12-16: 1 mg via INTRAVENOUS

## 2020-12-16 MED ORDER — LIDOCAINE 2% (20 MG/ML) 5 ML SYRINGE
INTRAMUSCULAR | Status: DC | PRN
  Administered 2020-12-16: 60 mg via INTRAVENOUS

## 2020-12-16 MED ORDER — LEVOTHYROXINE SODIUM 125 MCG PO TABS
125.0000 ug | ORAL_TABLET | Freq: Every day | ORAL | Status: DC
  Filled 2020-12-16: qty 1

## 2020-12-16 SURGICAL SUPPLY — 59 items
ADH SKN CLS APL DERMABOND .7 (GAUZE/BANDAGES/DRESSINGS) ×1
ATTRACTOMAT 16X20 MAGNETIC DRP (DRAPES) IMPLANT
BLADE CLIPPER SURG (BLADE) IMPLANT
BLADE SURG 10 STRL SS (BLADE) IMPLANT
BLADE SURG 15 STRL LF DISP TIS (BLADE) ×1 IMPLANT
BLADE SURG 15 STRL SS (BLADE) ×2
CANISTER SUCT 1200ML W/VALVE (MISCELLANEOUS) ×2 IMPLANT
CLIP TI WIDE RED SMALL 6 (CLIP) IMPLANT
CORD BIPOLAR FORCEPS 12FT (ELECTRODE) ×2 IMPLANT
COVER BACK TABLE 60X90IN (DRAPES) ×2 IMPLANT
COVER MAYO STAND STRL (DRAPES) ×2 IMPLANT
DECANTER SPIKE VIAL GLASS SM (MISCELLANEOUS) IMPLANT
DERMABOND ADVANCED (GAUZE/BANDAGES/DRESSINGS) ×1
DERMABOND ADVANCED .7 DNX12 (GAUZE/BANDAGES/DRESSINGS) ×1 IMPLANT
DRAIN CHANNEL 10F 3/8 F FF (DRAIN) ×2 IMPLANT
DRAPE U-SHAPE 76X120 STRL (DRAPES) ×2 IMPLANT
ELECT COATED BLADE 2.86 ST (ELECTRODE) ×2 IMPLANT
ELECT REM PT RETURN 9FT ADLT (ELECTROSURGICAL) ×2
ELECTRODE REM PT RTRN 9FT ADLT (ELECTROSURGICAL) ×1 IMPLANT
EVACUATOR SILICONE 100CC (DRAIN) ×2 IMPLANT
FORCEPS BIPOLAR SPETZLER 8 1.0 (NEUROSURGERY SUPPLIES) ×2 IMPLANT
GAUZE 4X4 16PLY ~~LOC~~+RFID DBL (SPONGE) ×4 IMPLANT
GAUZE SPONGE 4X4 12PLY STRL LF (GAUZE/BANDAGES/DRESSINGS) IMPLANT
GLOVE SURG ENC MOIS LTX SZ6.5 (GLOVE) IMPLANT
GLOVE SURG ENC MOIS LTX SZ7.5 (GLOVE) ×2 IMPLANT
GLOVE SURG POLYISO LF SZ6.5 (GLOVE) ×2 IMPLANT
GLOVE SURG POLYISO LF SZ7 (GLOVE) ×4 IMPLANT
GLOVE SURG UNDER POLY LF SZ7 (GLOVE) ×8 IMPLANT
GOWN STRL REUS W/ TWL LRG LVL3 (GOWN DISPOSABLE) ×4 IMPLANT
GOWN STRL REUS W/TWL LRG LVL3 (GOWN DISPOSABLE) ×8
HEMOSTAT SURGICEL 2X14 (HEMOSTASIS) IMPLANT
NEEDLE HYPO 25X1 1.5 SAFETY (NEEDLE) ×2 IMPLANT
NS IRRIG 1000ML POUR BTL (IV SOLUTION) ×2 IMPLANT
PACK BASIN DAY SURGERY FS (CUSTOM PROCEDURE TRAY) ×2 IMPLANT
PENCIL SMOKE EVACUATOR (MISCELLANEOUS) ×2 IMPLANT
PIN SAFETY STERILE (MISCELLANEOUS) ×2 IMPLANT
PROBE NERVBE PRASS .33 (MISCELLANEOUS) ×2 IMPLANT
SHEARS HARMONIC 9CM CVD (BLADE) ×2 IMPLANT
SLEEVE SCD COMPRESS KNEE MED (STOCKING) ×2 IMPLANT
SPONGE INTESTINAL PEANUT (DISPOSABLE) ×4 IMPLANT
STAPLER VISISTAT 35W (STAPLE) IMPLANT
SUT ETHILON 3 0 PS 1 (SUTURE) ×2 IMPLANT
SUT PROLENE 5 0 P 3 (SUTURE) IMPLANT
SUT SILK 2 0 SH (SUTURE) ×2 IMPLANT
SUT SILK 2 0 TIES 17X18 (SUTURE)
SUT SILK 2-0 18XBRD TIE BLK (SUTURE) IMPLANT
SUT SILK 3 0 TIES 17X18 (SUTURE) ×2
SUT SILK 3-0 18XBRD TIE BLK (SUTURE) ×1 IMPLANT
SUT VIC AB 3-0 FS2 27 (SUTURE) ×2 IMPLANT
SUT VICRYL 4-0 PS2 18IN ABS (SUTURE) ×2 IMPLANT
SYR BULB EAR ULCER 3OZ GRN STR (SYRINGE) ×2 IMPLANT
SYR CONTROL 10ML LL (SYRINGE) ×2 IMPLANT
TOWEL GREEN STERILE FF (TOWEL DISPOSABLE) ×4 IMPLANT
TRAY DSU PREP LF (CUSTOM PROCEDURE TRAY) ×2 IMPLANT
TUBE CONNECTING 20X1/4 (TUBING) ×2 IMPLANT
TUBE ENDOTRAC NIMS EMG 6MM (MISCELLANEOUS) IMPLANT
TUBE ENDOTRAC NIMS EMG 7MM (MISCELLANEOUS) ×2 IMPLANT
TUBE ENDOTRAC NIMS EMG 8MM (MISCELLANEOUS) IMPLANT
TUBE ENDOTRAC NIMS EMG 9MM (MISCELLANEOUS) IMPLANT

## 2020-12-16 NOTE — Anesthesia Procedure Notes (Signed)
Procedure Name: Intubation Date/Time: 12/16/2020 9:18 AM Performed by: Lavonia Dana, CRNA Pre-anesthesia Checklist: Patient identified, Emergency Drugs available, Suction available and Patient being monitored Patient Re-evaluated:Patient Re-evaluated prior to induction Oxygen Delivery Method: Circle system utilized Preoxygenation: Pre-oxygenation with 100% oxygen Induction Type: IV induction Ventilation: Mask ventilation without difficulty Laryngoscope Size: Mac and 3 Grade View: Grade II Tube type: Oral (NIM tube) Tube size: 7.0 mm Number of attempts: 1 Airway Equipment and Method: Stylet and Oral airway Placement Confirmation: ETT inserted through vocal cords under direct vision, positive ETCO2 and breath sounds checked- equal and bilateral Tube secured with: Tape Dental Injury: Teeth and Oropharynx as per pre-operative assessment  Comments: NIM tube utilized; direct visualization of ETT going through cords, blue markings centered at cords

## 2020-12-16 NOTE — H&P (Signed)
Cc: Right neck mass  HPI: The patient is a 65 year old female who returns today for her follow-up evaluation. The patient was previously seen for a right neck mass.  The patient has been complaining of chronic cough, hoarseness and fatigue for over a year.  Her neck ultrasound showed a right neck mass adjacent to the thyroid gland.  Her subsequent CT showed a 2.4 cm mass along the right lateral thyroid nodule.  Her image characteristics favored a parathyroid adenoma.  However, the patient's calcium and parathyroid hormone levels were normal.  She subsequently underwent FNA and core needle biopsy of the right neck mass.  The FNA showed no malignant cells.  The core needle biopsy specimen was nondiagnostic.  It may be consistent with thyroid tissue.  The patient returns today complaining of chronic fatigue.  She reports intermittent hoarseness and cough.  She has a history of hypothyroidism.  She is currently on 125 micrograms of Synthroid. No other ENT, GI, or respiratory issue noted since the last visit.   Exam: General: Communicates without difficulty, well nourished, no acute distress. Head: Normocephalic, no evidence injury, no tenderness, facial buttresses intact without stepoff. Eyes: PERRL, EOMI.  No scleral icterus, conjunctivae clear. Ears: External auditory canals clear bilaterally.  There is no edema or erythema.  Tympanic membrane is within normal limits bilaterally. Nose: Normal skin and external support.  Anterior rhinoscopy reveals congested mucosa over the septum and turbinates.  No lesions or polyps were seen. Oral cavity: Lips without lesions, oral mucosa moist, no masses or lesions seen. Indirect  mirror laryngoscopy could not be tolerated. Pharynx: Clear, no erythema. Neck: Supple, full range of motion, no lymphadenopathy, no large masses palpable. Salivary: Parotid and submandibular glands without mass. Neuro:  CN 2-12 grossly intact. Gait normal. Vestibular: No nystagmus at any point of  gaze.   Assessment  1.  The patient has a 2.4 cm mass adjacent to the right thyroid lobe.  Her FNA cytopathology and core needle biopsy were all nondiagnostic.  Her calcium and PTH levels were all normal.  2.  Chronic fatigue.  Her vocal cords were noted to be mobile bilaterally at her previous visit.  3.  The rest of her ENT exam is unremarkable.   Plan  1.  The physical exam findings and biopsy results are extensively discussed with the patient and her husband.  2.  In light of the uncertainty of her biopsy, and with persistent symptoms, the patient may benefit from surgical removal of the right neck mass.  In this case, the most likely surgical approach will involve right hemithyroidectomy approach.  The risks, benefits, alternatives and details of the procedure are extensively discussed.  Questions are invited and answered.  3.  The patient would like to proceed with the procedure.

## 2020-12-16 NOTE — Anesthesia Postprocedure Evaluation (Signed)
Anesthesia Post Note  Patient: Courtney White  Procedure(s) Performed: HEMI THYROIDECTOMY (Right: Neck)     Patient location during evaluation: PACU Anesthesia Type: General Level of consciousness: awake and alert Pain management: pain level controlled Vital Signs Assessment: post-procedure vital signs reviewed and stable Respiratory status: spontaneous breathing, nonlabored ventilation, respiratory function stable and patient connected to nasal cannula oxygen Cardiovascular status: blood pressure returned to baseline and stable Postop Assessment: no apparent nausea or vomiting Anesthetic complications: no   No notable events documented.  Last Vitals:  Vitals:   12/16/20 1300 12/16/20 1330  BP: 139/87   Pulse: 88   Resp: 16   Temp: 36.8 C   SpO2: 94% 96%    Last Pain:  Vitals:   12/16/20 1345  TempSrc:   PainSc: 2                  Tiajuana Amass

## 2020-12-16 NOTE — Op Note (Signed)
DATE OF PROCEDURE:  12/16/2020                              OPERATIVE REPORT  SURGEON:  Leta Baptist, MD  PREOPERATIVE DIAGNOSES: 1. Right  thyroid mass.   POSTOPERATIVE DIAGNOSES: 1. Right thyroid mass.   PROCEDURE PERFORMED: Right total thyroid lobectomy.   ANESTHESIA:  General endotracheal tube anesthesia.   COMPLICATIONS:  None.   ESTIMATED BLOOD LOSS: 50 ml   INDICATION FOR PROCEDURE: Courtney White is a 65 y.o. female with a history of a right neck mass.  Her neck ultrasound showed a right neck mass adjacent to the thyroid gland.  Her subsequent CT showed a 2.4 cm mass along the right lateral thyroid nodule.  Her image characteristics favored a parathyroid adenoma.  However, the patient's calcium and parathyroid hormone levels were normal.  She subsequently underwent FNA and core needle biopsy of the right neck mass.  The FNA showed no malignant cells.  The core needle biopsy specimen was nondiagnostic.  It was consistent with thyroid tissue. The mass was causing compressive symptoms. Based on the above findings, the decision was made for the patient to undergo the above stated procedure. Likelihood of success in reducing symptoms was also discussed.  The risks, benefits, alternatives, and details of the procedure were discussed with the patient.  Questions were invited and answered.  Informed consent was obtained.   DESCRIPTION:  The patient was taken to the operating room and placed supine on the operating table.  General endotracheal tube anesthesia was administered by the anesthesiologist.  A nerve monitoring endotracheal tube was used.  The nerve monitoring system was functional throughout the case.   The patient was positioned and prepped and draped in the standard fashion for thyroid surgery.  1% lidocaine with 1 100,000 epinephrine was infiltrated at the planned site of incision.  A transverse lower neck incision was made.  The incision was carried down to the level of the platysma  muscles.  Superiorly based and inferiorly based subplatysmal flaps were elevated in the standard fashion.  The strap muscles were divided at midline, and retracted laterally, exposing the thyroid gland.   Careful dissection was performed to free the right thyroid lobe from the surrounding soft tissue.  The patient was noted to have multiple right thyroid nodules.  The right recurrent laryngeal nerve was identified and preserved.  The nerve was noted to be functional throughout the case.  An inferior right parathyroid gland was also identified and preserved.  The entire right thyroid lobe was resected and sent to the pathology department for permanent histologic identification.  A #10 JP drain was placed.  The strap muscles were reapproximated with 4-0 Vicryl sutures.  The incision was closed in layers with 4-0 Vicryl and Dermabond.   The care of the patient was turned over to the anesthesiologist.  The patient was awakened from anesthesia without difficulty.  The patient was extubated and transferred to the recovery room in good condition.   OPERATIVE FINDINGS:  Multiple nodules were noted within the right thyroid lobe.   SPECIMEN: Right thyroid lobe.   FOLLOWUP CARE:  The patient will be admitted for overnight observation.  Courtney White 12/16/2020 11:03 AM

## 2020-12-16 NOTE — Anesthesia Preprocedure Evaluation (Signed)
Anesthesia Evaluation  Patient identified by MRN, date of birth, ID band Patient awake    Reviewed: Allergy & Precautions, NPO status , Patient's Chart, lab work & pertinent test results  Airway Mallampati: II  TM Distance: >3 FB Neck ROM: Full    Dental  (+) Dental Advisory Given   Pulmonary neg pulmonary ROS,    breath sounds clear to auscultation       Cardiovascular hypertension, Pt. on medications  Rhythm:Regular Rate:Normal     Neuro/Psych negative neurological ROS     GI/Hepatic negative GI ROS, (+) Hepatitis -, C  Endo/Other  Hypothyroidism   Renal/GU negative Renal ROS     Musculoskeletal   Abdominal   Peds  Hematology negative hematology ROS (+)   Anesthesia Other Findings   Reproductive/Obstetrics                             Anesthesia Physical Anesthesia Plan  ASA: 2  Anesthesia Plan: General   Post-op Pain Management:    Induction: Intravenous  PONV Risk Score and Plan: 3 and Dexamethasone, Ondansetron, Midazolam and Treatment may vary due to age or medical condition  Airway Management Planned: Oral ETT  Additional Equipment: None  Intra-op Plan:   Post-operative Plan: Extubation in OR  Informed Consent: I have reviewed the patients History and Physical, chart, labs and discussed the procedure including the risks, benefits and alternatives for the proposed anesthesia with the patient or authorized representative who has indicated his/her understanding and acceptance.     Dental advisory given  Plan Discussed with: CRNA  Anesthesia Plan Comments:         Anesthesia Quick Evaluation

## 2020-12-16 NOTE — Transfer of Care (Signed)
Immediate Anesthesia Transfer of Care Note  Patient: Courtney White  Procedure(s) Performed: HEMI THYROIDECTOMY (Right: Neck)  Patient Location: PACU  Anesthesia Type:General  Level of Consciousness: drowsy  Airway & Oxygen Therapy: Patient Spontanous Breathing and Patient connected to face mask oxygen  Post-op Assessment: Report given to RN and Post -op Vital signs reviewed and stable  Post vital signs: Reviewed and stable  Last Vitals:  Vitals Value Taken Time  BP 136/81 12/16/20 1217  Temp 36.9 C 12/16/20 1113  Pulse 86 12/16/20 1229  Resp 16 12/16/20 1229  SpO2 92 % 12/16/20 1229  Vitals shown include unvalidated device data.  Last Pain:  Vitals:   12/16/20 1200  TempSrc:   PainSc: 3       Patients Stated Pain Goal: 2 ([id number])  Complications: No notable events documented.

## 2020-12-17 MED ORDER — AMOXICILLIN 875 MG PO TABS: 875.0000 mg | ORAL_TABLET | Freq: Two times a day (BID) | ORAL | 0 refills | Status: AC

## 2020-12-17 MED ORDER — OXYCODONE-ACETAMINOPHEN 5-325 MG PO TABS: 1.0000 | ORAL_TABLET | ORAL | 0 refills | Status: AC | PRN

## 2020-12-17 NOTE — Discharge Instructions (Signed)
Thyroidectomy, Care After This sheet gives you information about how to care for yourself after your procedure. Your health care provider may also give you more specific instructions. If you have problems or questions, contact your health careprovider. What can I expect after the procedure? After the procedure, it is common to have: Mild pain in the neck or upper body, especially when swallowing. A swollen neck. A sore throat. A weak or hoarse voice. Slight tingling or numbness around your mouth, or in your fingers or toes. This may last for a day or two after surgery. This condition is caused by low levels of calcium. You may be given calcium supplements to treat it. Follow these instructions at home:  Medicines Take over-the-counter and prescription medicines only as told by your health care provider. Do not drive or use heavy machinery while taking prescription pain medicine. Do not take medicines that contain aspirin and ibuprofen until your health care provider says that you can. These medicines can increase your risk of bleeding.  Eating and drinking Start slowly with eating. You may need to have only liquids and soft foods for a few days or as directed by your health care provider. To prevent or treat constipation while you are taking prescription pain medicine, your health care provider may recommend that you: Drink enough fluid to keep your urine pale yellow. Take over-the-counter or prescription medicines. Eat foods that are high in fiber, such as fresh fruits and vegetables, whole grains, and beans. Limit foods that are high in fat and processed sugars, such as fried and sweet foods. Incision care Follow instructions from your health care provider about how to take care of your incision. Make sure you: Leave stitches (sutures), skin glue, or adhesive strips in place. These skin closures may need to stay in place for 2 weeks or longer. If adhesive strip edges start to loosen and  curl up, you may trim the loose edges.  Check your incision area every day for signs of infection. Check for: Redness, swelling, or pain. Fluid or blood. Warmth. Pus or a bad smell. Activity For the first 10 days after the procedure or as instructed by your health care provider: Do not jog, swim, or do other strenuous exercises. Do not play contact sports. Avoid sitting for a long time without moving. Get up to take short walks every 1-2 hours. This is needed to improve blood flow and breathing. Ask for help if you feel weak or unsteady. Return to your normal activities as told by your health care provider. Ask your health care provider what activities are safe for you. General instructions Do not use any products that contain nicotine or tobacco, such as cigarettes and e-cigarettes. These can delay healing after surgery. If you need help quitting, ask your health care provider. Keep all follow-up visits as told by your health care provider. This is important. Your health care provider needs to monitor the calcium level in your blood to make sure that it does not become low. Contact a health care provider if you: Have a fever. Have more redness, swelling, or pain around your incision area. Have fluid or blood coming from your incision area. Notice that your incision area feels warm to the touch. Have pus or a bad smell coming from your incision area. Have trouble talking. Have nausea or vomiting for more than 2 days. Get help right away if you: Have trouble breathing. Have trouble swallowing. Develop a rash. Develop a cough that gets worse. Notice  that your speech changes, or you have hoarseness that gets worse. Develop numbness, tingling, or muscle spasms in the arms, hands, feet, or face. Summary After the procedure, it is common to feel mild pain in the neck or upper body, especially when swallowing. Take medicines as told by your health care provider. These include pain medicines  and thyroid hormones, if required. Follow instructions from your health care provider about how to take care of your incision. Watch for signs of infection. Keep all follow-up visits as told by your health care provider. This is important. Your health care provider needs to monitor the calcium level in your blood to make sure that it does not become low. Get help right away if you develop difficulty breathing, or numbness, tingling, or muscle spasms in the arms, hands, feet, or face. This information is not intended to replace advice given to you by your health care provider. Make sure you discuss any questions you have with your healthcare provider. Document Revised: 12/17/2019 Document Reviewed: 12/17/2019 Elsevier Patient Education  [place].

## 2020-12-17 NOTE — Discharge Summary (Signed)
Physician Discharge Summary  Patient ID: Courtney White MRN: [id number] DOB/AGE: 07/25/1955 65 y.o.  Admit date: 12/16/2020 Discharge date: 12/17/2020  Admission Diagnoses: Right thyroid mass  Discharge Diagnoses: Right thyroid mass Active Problems:   S/P partial thyroidectomy   Discharged Condition: good  Hospital Course: Pt had an uneventful overnight stay. Pt tolerated po well. No bleeding. No stridor. No hoarseness.  Consults: None  Significant Diagnostic Studies: None  Treatments: surgery: Right hemithyroidectomy  Discharge Exam: Blood pressure (!) 145/84, pulse 87, temperature 97.9 F (36.6 C), resp. rate 16, height '5\' 6"'$  (1.676 m), weight 78.2 kg, SpO2 97 %. Incision/Wound:c/d/i Voice is strong  Disposition: Discharge disposition: 01-Home or Self Care       Discharge Instructions     Activity as tolerated - No restrictions   Complete by: As directed    Diet general   Complete by: As directed    No wound care   Complete by: As directed       Allergies as of 12/17/2020   No Known Allergies      Medication List     TAKE these medications    alendronate 70 MG tablet Commonly known as: FOSAMAX Take 1 tablet (70 mg total) by mouth every 7 (seven) days. Take with a full glass of water on an empty stomach.   amLODipine 5 MG tablet Commonly known as: NORVASC Take 1 tablet (5 mg total) by mouth daily. What changed: when to take this   amoxicillin 875 MG tablet Commonly known as: AMOXIL Take 1 tablet (875 mg total) by mouth 2 (two) times daily for 3 days.   brompheniramine-pseudoephedrine-DM 30-2-10 MG/5ML syrup Take 5 mLs by mouth 4 (four) times daily as needed.   fluticasone 50 MCG/ACT nasal spray Commonly known as: FLONASE Place 1 spray into both nostrils daily.   Icy Hot 16 % Liqd Generic drug: Menthol (Topical Analgesic) Apply 1 application topically daily as needed (pain).   levothyroxine 125 MCG tablet Commonly known as:  SYNTHROID Take 1 tablet (125 mcg total) by mouth daily. What changed: when to take this   LUBRICATING EYE DROPS OP Place 1 drop into both eyes daily as needed (dry eyes).   oxyCODONE-acetaminophen 5-325 MG tablet Commonly known as: Percocet Take 1 tablet by mouth every 4 (four) hours as needed for up to 3 days for severe pain.   rosuvastatin 10 MG tablet Commonly known as: Crestor Take 1 tablet (10 mg total) by mouth daily.   Vitamin D (Ergocalciferol) 1.25 MG (50000 UNIT) Caps capsule Commonly known as: DRISDOL Take 50,000 Units by mouth every Monday.        Follow-up Information     Leta Baptist, MD Follow up in 1 week(s).   Specialty: Otolaryngology Why: As scheduled Contact information: [address] [place] [address] [phone]                 Signed: Burley Saver 12/17/2020, 7:12 AM

## 2020-12-19 ENCOUNTER — Encounter (HOSPITAL_BASED_OUTPATIENT_CLINIC_OR_DEPARTMENT_OTHER): Payer: Self-pay | Admitting: Otolaryngology

## 2020-12-21 LAB — SURGICAL PATHOLOGY

## 2020-12-27 ENCOUNTER — Other Ambulatory Visit: Payer: Medicare Other

## 2020-12-27 ENCOUNTER — Other Ambulatory Visit: Payer: Self-pay

## 2020-12-27 ENCOUNTER — Ambulatory Visit (INDEPENDENT_AMBULATORY_CARE_PROVIDER_SITE_OTHER): Payer: Medicare Other | Admitting: *Deleted

## 2020-12-27 VITALS — BP 137/87 | HR 87

## 2020-12-27 DIAGNOSIS — D351 Benign neoplasm of parathyroid gland: Secondary | ICD-10-CM

## 2020-12-27 DIAGNOSIS — Z0130 Encounter for examination of blood pressure without abnormal findings: Secondary | ICD-10-CM

## 2020-12-27 DIAGNOSIS — E039 Hypothyroidism, unspecified: Secondary | ICD-10-CM

## 2020-12-27 NOTE — Progress Notes (Signed)
Pt came in for labs today and wanted BP checked while she was in the office. BP in office was 137/87 and pulse 87. Pt has appt with Dr Darnell Level 12/30/20 and will bring her cuff in to compare as she states her readings at home tend to be lower.

## 2020-12-28 LAB — CMP14+EGFR
ALT: 21 IU/L (ref 0–32)
AST: 18 IU/L (ref 0–40)
Albumin/Globulin Ratio: 1.8 (ref 1.2–2.2)
Albumin: 4.4 g/dL (ref 3.8–4.8)
Alkaline Phosphatase: 77 IU/L (ref 44–121)
BUN/Creatinine Ratio: 25 (ref 12–28)
BUN: 14 mg/dL (ref 8–27)
Bilirubin Total: 0.3 mg/dL (ref 0.0–1.2)
CO2: 22 mmol/L (ref 20–29)
Calcium: 10 mg/dL (ref 8.7–10.3)
Chloride: 105 mmol/L (ref 96–106)
Creatinine, Ser: 0.56 mg/dL — ABNORMAL LOW (ref 0.57–1.00)
Globulin, Total: 2.5 g/dL (ref 1.5–4.5)
Glucose: 102 mg/dL — ABNORMAL HIGH (ref 65–99)
Potassium: 4.1 mmol/L (ref 3.5–5.2)
Sodium: 140 mmol/L (ref 134–144)
Total Protein: 6.9 g/dL (ref 6.0–8.5)
eGFR: 101 mL/min/{1.73_m2} (ref >59)

## 2020-12-28 LAB — T4, FREE: Free T4: 1.74 ng/dL (ref 0.82–1.77)

## 2020-12-28 LAB — TSH: TSH: 0.203 u[IU]/mL — ABNORMAL LOW (ref 0.450–4.500)

## 2020-12-30 ENCOUNTER — Ambulatory Visit (INDEPENDENT_AMBULATORY_CARE_PROVIDER_SITE_OTHER): Payer: Medicare Other | Admitting: Family Medicine

## 2020-12-30 ENCOUNTER — Other Ambulatory Visit: Payer: Self-pay

## 2020-12-30 ENCOUNTER — Encounter: Payer: Self-pay | Admitting: Family Medicine

## 2020-12-30 VITALS — BP 122/86 | HR 92 | Temp 97.7°F | Ht 66.0 in | Wt 172.2 lb

## 2020-12-30 DIAGNOSIS — E890 Postprocedural hypothyroidism: Secondary | ICD-10-CM | POA: Diagnosis not present

## 2020-12-30 DIAGNOSIS — R0609 Other forms of dyspnea: Secondary | ICD-10-CM

## 2020-12-30 DIAGNOSIS — R0600 Dyspnea, unspecified: Secondary | ICD-10-CM | POA: Diagnosis not present

## 2020-12-30 DIAGNOSIS — E039 Hypothyroidism, unspecified: Secondary | ICD-10-CM | POA: Diagnosis not present

## 2020-12-30 MED ORDER — ALBUTEROL SULFATE HFA 108 (90 BASE) MCG/ACT IN AERS: 2.0000 | INHALATION_SPRAY | Freq: Four times a day (QID) | RESPIRATORY_TRACT | 0 refills | Status: DC | PRN

## 2020-12-30 NOTE — Progress Notes (Signed)
Subjective: CC: Follow-up dyspnea on exertion PCP: [name], DO [id number] J Kalbfleisch is a 65 y.o. female presenting to clinic today for:  1.  Dyspnea on exertion Patient has been struggling with dyspnea on exertion intermittently for the last couple of years.  No hemoptysis reported.  She is a non-smoker.  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  She identifies things like going up inclines or heavy physical activity as things that precipitate the dyspnea.  She does feel like she wheezes sometimes during these episodes.  2.  Hypothyroidism Patient is compliant with her Synthroid.  She is status post hemithyroidectomy about 2 weeks ago.  She is healing well from that and feels pretty good.  No reports of difficulty swallowing or changes in voice.  No complications at the surgical site.  ROS: Per HPI  No Known Allergies Past Medical History:  Diagnosis Date   COVID-19 12/02/2020   Hepatitis C 1992   s/p treatment.  she has cleared virus   HTN (hypertension)    Thyroid disease    Vitiligo     Current Outpatient Medications:    alendronate (FOSAMAX) 70 MG tablet, Take 1 tablet (70 mg total) by mouth every 7 (seven) days. Take with a full glass of water on an empty stomach., Disp: 4 tablet, Rfl: 11   amLODipine (NORVASC) 5 MG tablet, Take 1 tablet (5 mg total) by mouth daily. (Patient taking differently: Take 5 mg by mouth every evening.), Disp: 90 tablet, Rfl: 3   Carboxymethylcellul-Glycerin (LUBRICATING EYE DROPS OP), Place 1 drop into both eyes daily as needed (dry eyes)., Disp: , Rfl:    fluticasone (FLONASE) 50 MCG/ACT nasal spray, Place 1 spray into both nostrils daily., Disp: , Rfl:    levothyroxine (SYNTHROID) 125 MCG tablet, Take 1 tablet (125 mcg total) by mouth daily. (Patient taking differently: Take 125 mcg by mouth daily before breakfast.), Disp: 90 tablet, Rfl: 3   Menthol,  Topical Analgesic, (ICY HOT) 16 % LIQD, Apply 1 application topically daily as needed (pain)., Disp: , Rfl:    rosuvastatin (CRESTOR) 10 MG tablet, Take 1 tablet (10 mg total) by mouth daily., Disp: 90 tablet, Rfl: 3   Vitamin D, Ergocalciferol, (DRISDOL) 1.25 MG (50000 UNIT) CAPS capsule, Take 50,000 Units by mouth every Monday., Disp: , Rfl:  Social History   Socioeconomic History   Marital status: Significant Other    Spouse name: Not on file   Number of children: 0   Years of education: Not on file   Highest education level: Not on file  Occupational History   Not on file  Tobacco Use   Smoking status: Never   Smokeless tobacco: Never  Vaping Use   Vaping Use: Never used  Substance and Sexual Activity   Alcohol use: Not on file    Comment: occ   Drug use: Not Currently   Sexual activity: Yes    Birth control/protection: Post-menopausal  Other Topics Concern   Not on file  Social History Narrative   Patient is a retired Naval architect.  She is a widower and notes that she moved from the [place] area after being reunited with a high school sweetheart whom she is now engaged to.   She lives locally in [place].   No children.   Social Determinants of Health   Financial Resource Strain: Not on file  Food Insecurity: Not on file  Transportation Needs: Not  on file  Physical Activity: Not on file  Stress: Not on file  Social Connections: Not on file  Intimate Partner Violence: Not on file   Family History  Problem Relation Age of Onset   Diabetes Mother    Depression Mother    Hypertension Mother    Lung cancer Father    Hypertension Sister    Hypertension Sister    Thyroid disease Maternal Aunt     Objective: Office vital signs reviewed. BP 122/86   Pulse 92   Temp 97.7 F (36.5 C)   Ht '5\' 6"'$  (1.676 m)   Wt 172 lb 3.2 oz (78.1 kg)   LMP  (LMP Unknown)   SpO2 96%   BMI 27.79 kg/m   Physical Examination:  General: Awake, alert, well nourished, No  acute distress HEENT: Healing postop surgical site noted and along the anterior neck.  No evidence of secondary infection Cardio: regular rate and rhythm, S1S2 heard, no murmurs appreciated Pulm: clear to auscultation bilaterally, no wheezes, rhonchi or rales; normal work of breathing on room air  Assessment/ Plan: 65 y.o. female   Acquired hypothyroidism - Plan: TSH, T4, free  S/P partial thyroidectomy  Dyspnea on exertion - Plan: albuterol (VENTOLIN HFA) 108 (90 Base) MCG/ACT inhaler, Ambulatory referral to Pulmonology  TSH was suppressed on last lab draw.  However, she is status post hemithyroidectomy so will wait another 4 weeks before adjusting her Synthroid.  She is aware that she should come back within the next several weeks to have this lab redrawn.  Seems to be healing well after her thyroidectomy.  Continuing to have dyspnea on exertion and this has been a chronic issue for her now.  Going to place her on albuterol as a trial.  However, she wants to have formal pulmonary evaluation therefore I placed a referral to pulmonology.  No orders of the defined types were placed in this encounter.  No orders of the defined types were placed in this encounter.    [name], DO [place] [phone]

## 2020-12-30 NOTE — Patient Instructions (Signed)
Ok to simply come back in for labs in 3-4 weeks.  Albuterol sent in for as needed use.  DO NOT use for 2 days before lung appointment.

## 2021-01-29 ENCOUNTER — Other Ambulatory Visit: Payer: Self-pay | Admitting: Family Medicine

## 2021-01-29 DIAGNOSIS — R0609 Other forms of dyspnea: Secondary | ICD-10-CM

## 2021-02-20 ENCOUNTER — Ambulatory Visit: Payer: Medicare Other | Admitting: Internal Medicine

## 2021-02-20 ENCOUNTER — Ambulatory Visit (HOSPITAL_COMMUNITY)
Admission: RE | Admit: 2021-02-20 | Discharge: 2021-02-20 | Disposition: A | Discharge status: Home / Self Care | Payer: Medicare Other | Source: Ambulatory Visit | Attending: Internal Medicine | Admitting: Internal Medicine

## 2021-02-20 ENCOUNTER — Encounter: Payer: Self-pay | Admitting: Internal Medicine

## 2021-02-20 ENCOUNTER — Other Ambulatory Visit: Payer: Self-pay

## 2021-02-20 VITALS — BP 144/92 | HR 85 | Temp 98.6°F | Ht 62.0 in | Wt 174.1 lb

## 2021-02-20 DIAGNOSIS — R0609 Other forms of dyspnea: Secondary | ICD-10-CM | POA: Diagnosis present

## 2021-02-20 DIAGNOSIS — Z23 Encounter for immunization: Secondary | ICD-10-CM

## 2021-02-20 MED ORDER — PANTOPRAZOLE SODIUM 40 MG PO TBEC: 40.0000 mg | DELAYED_RELEASE_TABLET | Freq: Every day | ORAL | 2 refills | Status: DC

## 2021-02-20 MED ORDER — FAMOTIDINE 20 MG PO TABS: ORAL_TABLET | ORAL | 11 refills | Status: DC

## 2021-02-20 NOTE — Assessment & Plan Note (Addendum)
Onset around 2020  - neg GXT  09/20/2020 [facility]  - 02/20/2021   Walked on RA  x  3  lap(s) =  approx 461ft @ fast to very fast pace, stopped due to end of study fighting urge to clear throat, min sob with lowest 02 sats 97%  - 02/20/2021 trial of gerd rx and reconditioning >>>  Symptoms are markedly disproportionate to objective findings and not clear to what extent this is actually a pulmonary  problem but pt does appear to have difficult to sort out respiratory symptoms of unknown origin for which  DDX  = almost all start with A and  include Adherence, Ace Inhibitors, Acid Reflux, Active Sinus Disease, Alpha 1 Antitripsin deficiency, Anxiety masquerading as Airways dz,  ABPA,  Allergy(esp in young), Aspiration (esp in elderly), Adverse effects of meds,  Active smoking or Vaping, A bunch of PE's/clot burden (a few small clots can't cause this syndrome unless there is already severe underlying pulm or vascular dz with poor reserve),  Anemia or thyroid disorder, plus two Bs  = Bronchiectasis and Beta blocker use..and one C= CHF     Adherence is always the initial "prime suspect" and is a multilayered concern that requires a "trust but verify" approach in every patient - starting with knowing how to use medications, especially inhalers, correctly, keeping up with refills and understanding the fundamental difference between maintenance and prns vs those medications only taken for a very short course and then stopped and not refilled.   ? Acid (or non-acid) GERD > always difficult to exclude as up to 75% of pts in some series report no assoc GI/ Heartburn symptoms and she is on fosamax > rec try off fosamax x 6 week plus max (24h)  acid suppression and diet restrictions/ reviewed and instructions given in writing.   ? Allergy / asthma > check profile  Re SABA :  I spent extra time with pt today reviewing appropriate use of albuterol for prn use on exertion with the following points: 1) saba is for relief  of sob that does not improve by walking a slower pace or resting but rather if the pt does not improve after trying this first. 2) If the pt is convinced, as many are, that saba helps recover from activity faster then it's easy to tell if this is the case by re-challenging : ie stop, take the inhaler, then p 5 minutes try the exact same activity (intensity of workload) that just caused the symptoms and see if they are substantially diminished or not after saba 3) if there is an activity that reproducibly causes the symptoms, try the saba 15 min before the activity on alternate days   If in fact the saba really does help, then fine to continue to use it prn but advised may need to look closer at the maintenance regimen being used to achieve better control of airways disease with exertion.   Anemia/ thryroid dz> hgb ok but TSH up, will need f/u by PCP  ? Anxiety/depression/ wt gain over baseline 150 to now 174  > usually at the bottom of this list of usual suspects but  may interfere with adherence and also interpretation of response or lack thereof to symptom management which can be quite subjective.   ? A bunch of PEs D dimer nl - while a normal  or high normal value (seen commonly in the elderly or chronically ill)  may miss small peripheral pe, the clot burden with  sob is moderately high and the d dimer  has a very high neg pred value if used in this setting.    ? chf > bnp nl    If not improve with regular sub max ex x 6 weeks then cpst next   Discussed in detail all the  indications, usual  risks and alternatives  relative to the benefits with patient who agrees to proceed with w/u as outlined.      Each maintenance medication was reviewed in detail including emphasizing most importantly the difference between maintenance and prns and under what circumstances the prns are to be triggered using an action plan format where appropriate.  Total time for H and P, chart review, counseling,   directly observing portions of ambulatory 02 saturation study/ and generating customized AVS unique to this office visit / same day charting  > 45 min

## 2021-02-20 NOTE — Progress Notes (Signed)
Courtney White, female    DOB: 11-05-1955    MRN: [id number]   Brief patient profile:  70 yowf never smoker referred to pulmonary clinic in [place]  02/20/2021 by Dr  Lemar Lofty for cough onset of recurrent/ episodic "bronchitis" typically "over the xmas holidays" rx with abx/ prednisone then around 04/2018 pattern "came and stayed " until L thryoidectomy September 2022 by Benjamine Mola seemed some better p that but breathing no better.   Prior to chronic symptoms  baseline wt  around 150 lb     History of Present Illness  02/20/2021  Pulmonary/ 1st office eval/ [facility]  Chief Complaint  Patient presents with   Consult    SOB and coughing episodes for a year and a half per pt. Ref by PCP.   Dyspnea:  mailbox and back x 100 ft and uphill back is a struggle and has to slow down whereas no problem prior to onset  Cough: sensation of pnds/ throat clearing x 2020  Sleep: bed flat / 30 degree  with pillows does fine  SABA use: may have helped  Covid spring  2022 no real change in chronic symptoms   No obvious day to day or daytime variability or assoc excess/ purulent sputum or mucus plugs or hemoptysis or cp or chest tightness, subjective wheeze or overt sinus or hb symptoms.   Sleeping as above without nocturnal  or early am exacerbation  of respiratory  c/o's or need for noct saba. Also denies any obvious fluctuation of symptoms with weather or environmental changes or other aggravating or alleviating factors except as outlined above   No unusual exposure hx or h/o childhood pna/ asthma or knowledge of premature birth.  Current Allergies, Complete Past Medical History, Past Surgical History, Family History, and Social History were reviewed in [facility] record.  ROS  The following are not active complaints unless bolded Hoarseness, sore throat, dysphagia/globus , dental problems, itching, sneezing,  nasal congestion or discharge of excess mucus or  purulent secretions, ear ache,   fever, chills, sweats, unintended wt loss or wt gain, classically pleuritic or exertional cp,  orthopnea pnd or arm/hand swelling  or leg swelling, presyncope, palpitations, abdominal pain, anorexia, nausea, vomiting, diarrhea  or change in bowel habits or change in bladder habits, change in stools or change in urine, dysuria, hematuria,  rash, arthralgias, visual complaints, headache, numbness, weakness or ataxia or problems with walking or coordination,  change in mood or  memory.         Past Medical History:  Diagnosis Date   COVID-19 12/02/2020   Hepatitis C 1992   s/p treatment.  she has cleared virus   HTN (hypertension)    Thyroid disease    Vitiligo     Outpatient Medications Prior to Visit  Medication Sig Dispense Refill   albuterol (VENTOLIN HFA) 108 (90 Base) MCG/ACT inhaler TAKE 2 PUFFS BY MOUTH EVERY 6 HOURS AS NEEDED FOR WHEEZE OR SHORTNESS OF BREATH 8.5 each 0   alendronate (FOSAMAX) 70 MG tablet Take 1 tablet (70 mg total) by mouth every 7 (seven) days. Take with a full glass of water on an empty stomach. 4 tablet 11   amLODipine (NORVASC) 5 MG tablet Take 1 tablet (5 mg total) by mouth daily. (Patient taking differently: Take 5 mg by mouth every evening.) 90 tablet 3   Carboxymethylcellul-Glycerin (LUBRICATING EYE DROPS OP) Place 1 drop into both eyes daily as needed (dry eyes).  fluticasone (FLONASE) 50 MCG/ACT nasal spray Place 1 spray into both nostrils daily.     levothyroxine (SYNTHROID) 125 MCG tablet Take 1 tablet (125 mcg total) by mouth daily. (Patient taking differently: Take 125 mcg by mouth daily before breakfast.) 90 tablet 3   Menthol, Topical Analgesic, (ICY HOT) 16 % LIQD Apply 1 application topically daily as needed (pain).     rosuvastatin (CRESTOR) 10 MG tablet Take 1 tablet (10 mg total) by mouth daily. 90 tablet 3   Vitamin D, Ergocalciferol, (DRISDOL) 1.25 MG (50000 UNIT) CAPS capsule Take 50,000 Units by mouth every  Monday.     No facility-administered medications prior to visit.     Objective:     BP (!) 144/92 (BP Location: Left Arm, Patient Position: Sitting)   Pulse 85   Temp 98.6 F (37 C)   Ht 5\' 2"  (1.575 m)   Wt 174 lb 1.9 oz (79 kg)   LMP  (LMP Unknown)   SpO2 98%   BMI 31.85 kg/m   SpO2: 98 %  Wt Readings from Last 3 Encounters:  02/20/21 174 lb 1.9 oz (79 kg)  12/30/20 172 lb 3.2 oz (78.1 kg)  12/16/20 172 lb 6.4 oz (78.2 kg)      Vital signs reviewed  02/20/2021  - Note at rest 02 sats  98% on RA   General appearance:    amb wf freq throat clearance, somewhat hyperkinetic   HEENT : pt wearing mask not removed for exam due to covid -19 concerns.    NECK :  without JVD/Nodes/TM/ nl carotid upstrokes bilaterally   LUNGS: no acc muscle use,  Nl contour chest which is clear to A and P bilaterally without cough on insp or exp maneuvers   CV:  RRR  no s3 or murmur or increase in P2, and no edema   ABD:  soft and nontender with nl inspiratory excursion in the supine position. No bruits or organomegaly appreciated, bowel sounds nl  MS:  Nl gait/ ext warm without deformities, calf tenderness, cyanosis or clubbing No obvious joint restrictions   SKIN: warm and dry without lesions    NEURO:  alert, approp, nl sensorium with  no motor or cerebellar deficits apparent.      CXR PA and Lateral:   02/20/2021 :    I personally reviewed images and agree with radiology impression as follows:    The heart size and mediastinal contours are within normal limits. Both lungs are clear.   Labs ordered/ reviewed:      Chemistry      Component Value Date/Time   NA 138 02/20/2021 1454   K 4.7 02/20/2021 1454   CL 102 02/20/2021 1454   CO2 22 02/20/2021 1454   BUN 14 02/20/2021 1454   CREATININE 0.71 02/20/2021 1454      Component Value Date/Time   CALCIUM 9.7 02/20/2021 1454   ALKPHOS 77 12/27/2020 0912   AST 18 12/27/2020 0912   ALT 21 12/27/2020 0912   BILITOT 0.3  12/27/2020 0912        Lab Results  Component Value Date   WBC 7.2 02/20/2021   HGB 13.4 02/20/2021   HCT 40.8 02/20/2021   MCV 89 02/20/2021   PLT 288 02/20/2021     Lab Results  Component Value Date   DDIMER <0.20 02/20/2021      Lab Results  Component Value Date   TSH 8.870 (H) 02/20/2021      BNP   02/20/2021   =  15     Lab Results  Component Value Date   ESRSEDRATE 19 02/20/2021   ESRSEDRATE 31 (H) 07/13/2020          Assessment   DOE (dyspnea on exertion) Onset around 2020  - neg GXT  09/20/2020 [place]  - 02/20/2021   Walked on RA  x  3  lap(s) =  approx 452ft @ fast to very fast pace, stopped due to end of study fighting urge to clear throat, min sob with lowest 02 sats 97%  - 02/20/2021 trial of gerd rx and reconditioning >>>  Symptoms are markedly disproportionate to objective findings and not clear to what extent this is actually a pulmonary  problem but pt does appear to have difficult to sort out respiratory symptoms of unknown origin for which  DDX  = almost all start with A and  include Adherence, Ace Inhibitors, Acid Reflux, Active Sinus Disease, Alpha 1 Antitripsin deficiency, Anxiety masquerading as Airways dz,  ABPA,  Allergy(esp in young), Aspiration (esp in elderly), Adverse effects of meds,  Active smoking or Vaping, A bunch of PE's/clot burden (a few small clots can't cause this syndrome unless there is already severe underlying pulm or vascular dz with poor reserve),  Anemia or thyroid disorder, plus two Bs  = Bronchiectasis and Beta blocker use..and one C= CHF     Adherence is always the initial "prime suspect" and is a multilayered concern that requires a "trust but verify" approach in every patient - starting with knowing how to use medications, especially inhalers, correctly, keeping up with refills and understanding the fundamental difference between maintenance and prns vs those medications only taken for a very short course and then stopped  and not refilled.   ? Acid (or non-acid) GERD > always difficult to exclude as up to 75% of pts in some series report no assoc GI/ Heartburn symptoms and she is on fosamax > rec try off fosamax x 6 week plus max (24h)  acid suppression and diet restrictions/ reviewed and instructions given in writing.   ? Allergy / asthma > check profile  Re SABA :  I spent extra time with pt today reviewing appropriate use of albuterol for prn use on exertion with the following points: 1) saba is for relief of sob that does not improve by walking a slower pace or resting but rather if the pt does not improve after trying this first. 2) If the pt is convinced, as many are, that saba helps recover from activity faster then it's easy to tell if this is the case by re-challenging : ie stop, take the inhaler, then p 5 minutes try the exact same activity (intensity of workload) that just caused the symptoms and see if they are substantially diminished or not after saba 3) if there is an activity that reproducibly causes the symptoms, try the saba 15 min before the activity on alternate days   If in fact the saba really does help, then fine to continue to use it prn but advised may need to look closer at the maintenance regimen being used to achieve better control of airways disease with exertion.   Anemia/ thryroid dz> hgb ok but TSH up, will need f/u by PCP  ? Anxiety/depression/ wt gain over baseline 150 to now 174  > usually at the bottom of this list of usual suspects but  may interfere with adherence and also interpretation of response or lack thereof to symptom management which can be quite  subjective.   ? A bunch of PEs D dimer nl - while a normal  or high normal value (seen commonly in the elderly or chronically ill)  may miss small peripheral pe, the clot burden with sob is moderately high and the d dimer  has a very high neg pred value if used in this setting.    ? chf > bnp nl    If not improve with  regular sub max ex x 6 weeks then cpst next   Discussed in detail all the  indications, usual  risks and alternatives  relative to the benefits with patient who agrees to proceed with w/u as outlined.      Each maintenance medication was reviewed in detail including emphasizing most importantly the difference between maintenance and prns and under what circumstances the prns are to be triggered using an action plan format where appropriate.  Total time for H and P, chart review, counseling,  directly observing portions of ambulatory 02 saturation study/ and generating customized AVS unique to this office visit / same day charting  > 45 min                  Christinia Gully, MD 02/20/2021

## 2021-02-20 NOTE — Patient Instructions (Addendum)
Pantoprazole (protonix) 40 mg   Take  30-60 min before first meal of the day and Pepcid (famotidine)  20 mg after supper until return to office - this is the best way to tell whether stomach acid is contributing to your problem.    Stop fosamax for the next 6 weeks    GERD (REFLUX)  is an extremely common cause of respiratory symptoms just like yours , many times with no obvious heartburn at all.    It can be treated with medication, but also with lifestyle changes including elevation of the head of your bed (ideally with 6 -8inch blocks under the headboard of your bed),  Smoking cessation, avoidance of late meals, excessive alcohol, and avoid fatty foods, chocolate, peppermint, colas, red wine, and acidic juices such as orange juice.  NO MINT OR MENTHOL PRODUCTS SO NO COUGH DROPS  USE SUGARLESS CANDY INSTEAD (Jolley ranchers or Stover's or Life Savers) or even ice chips will also do - the key is to swallow to prevent all throat clearing. NO OIL BASED VITAMINS - use powdered substitutes.  Avoid fish oil when coughing.   Please remember to go to the lab department   for your tests - we will call you with the results when they are available.     Please remember to go to the  x-ray department  @  [facility] [address] for your tests - we will call you with the results when they are available         To get the most out of exercise, you need to be continuously aware that you are short of breath, but never out of breath, for at least 30 minutes daily. As you improve, it will actually be easier for you to do the same amount of exercise  in  30 minutes so always push to the level where you are short of breath.    Make sure you check your oxygen saturations at highest level of activity several time a week   Ok to try albuterol (ventolin) 15 min before an activity (on alternating days)  that you know would usually make you short of breath and see if it makes any difference and if makes none then don't take  albuterol after activity unless you can't catch your breath as this means it's the resting that helps, not the albuterol.      Please schedule a follow up office visit in 6 weeks, call sooner if needed

## 2021-02-28 LAB — CBC WITH DIFFERENTIAL/PLATELET
Basophils Absolute: 0.1 10*3/uL (ref 0.0–0.2)
Basos: 1 %
EOS (ABSOLUTE): 0.3 10*3/uL (ref 0.0–0.4)
Eos: 5 %
Hematocrit: 40.8 % (ref 34.0–46.6)
Hemoglobin: 13.4 g/dL (ref 11.1–15.9)
Immature Grans (Abs): 0 10*3/uL (ref 0.0–0.1)
Immature Granulocytes: 0 %
Lymphocytes Absolute: 2 10*3/uL (ref 0.7–3.1)
Lymphs: 28 %
MCH: 29.3 pg (ref 26.6–33.0)
MCHC: 32.8 g/dL (ref 31.5–35.7)
MCV: 89 fL (ref 79–97)
Monocytes Absolute: 0.4 10*3/uL (ref 0.1–0.9)
Monocytes: 6 %
Neutrophils Absolute: 4.4 10*3/uL (ref 1.4–7.0)
Neutrophils: 60 %
Platelets: 288 10*3/uL (ref 150–450)
RBC: 4.57 x10E6/uL (ref 3.77–5.28)
RDW: 13.1 % (ref 11.7–15.4)
WBC: 7.2 10*3/uL (ref 3.4–10.8)

## 2021-02-28 LAB — BASIC METABOLIC PANEL
BUN/Creatinine Ratio: 20 (ref 12–28)
BUN: 14 mg/dL (ref 8–27)
CO2: 22 mmol/L (ref 20–29)
Calcium: 9.7 mg/dL (ref 8.7–10.3)
Chloride: 102 mmol/L (ref 96–106)
Creatinine, Ser: 0.71 mg/dL (ref 0.57–1.00)
Glucose: 99 mg/dL (ref 70–99)
Potassium: 4.7 mmol/L (ref 3.5–5.2)
Sodium: 138 mmol/L (ref 134–144)
eGFR: 94 mL/min/{1.73_m2} (ref >59)

## 2021-02-28 LAB — D-DIMER, QUANTITATIVE: D-DIMER: <0.2 mg/L FEU (ref 0.00–0.49)

## 2021-02-28 LAB — BRAIN NATRIURETIC PEPTIDE: BNP: 15.1 pg/mL (ref 0.0–100.0)

## 2021-02-28 LAB — TSH: TSH: 8.87 u[IU]/mL — ABNORMAL HIGH (ref 0.450–4.500)

## 2021-02-28 LAB — SEDIMENTATION RATE: Sed Rate: 19 mm/hr (ref 0–40)

## 2021-02-28 LAB — IGE: IgE (Immunoglobulin E), Serum: 39 IU/mL (ref 6–495)

## 2021-02-28 NOTE — Progress Notes (Signed)
Tried calling the pt and there was no answer- LMTCB.  

## 2021-03-08 ENCOUNTER — Other Ambulatory Visit: Payer: Self-pay | Admitting: Family Medicine

## 2021-03-08 NOTE — Progress Notes (Signed)
Please inform patient that she has future orders in for thyroid recheck.  Her TSH was noted to be elevated.  Please make sure that she is getting this lab draw around mid December

## 2021-03-08 NOTE — Progress Notes (Signed)
Spoke with pt and notified of results per Dr. Melvyn Novas. Pt verbalized understanding and denied any questions.  Copy faxed to PCP.

## 2021-03-13 ENCOUNTER — Ambulatory Visit (INDEPENDENT_AMBULATORY_CARE_PROVIDER_SITE_OTHER): Payer: Self-pay | Admitting: Podiatry

## 2021-03-13 DIAGNOSIS — Z91199 Patient's noncompliance with other medical treatment and regimen due to unspecified reason: Secondary | ICD-10-CM

## 2021-03-13 NOTE — Progress Notes (Signed)
Patient no show for appointment.

## 2021-03-30 ENCOUNTER — Other Ambulatory Visit: Payer: Medicare Other

## 2021-03-30 DIAGNOSIS — E039 Hypothyroidism, unspecified: Secondary | ICD-10-CM

## 2021-03-31 LAB — TSH: TSH: 4.39 u[IU]/mL (ref 0.450–4.500)

## 2021-03-31 LAB — T4, FREE: Free T4: 1.77 ng/dL (ref 0.82–1.77)

## 2021-04-03 ENCOUNTER — Other Ambulatory Visit: Payer: Self-pay

## 2021-04-03 ENCOUNTER — Encounter: Payer: Self-pay | Admitting: Internal Medicine

## 2021-04-03 ENCOUNTER — Ambulatory Visit: Payer: Medicare Other | Admitting: Internal Medicine

## 2021-04-03 DIAGNOSIS — R0609 Other forms of dyspnea: Secondary | ICD-10-CM

## 2021-04-03 NOTE — Progress Notes (Signed)
Courtney White, female    DOB: [birth year]    MRN: [id number]   Brief patient profile:  45 yowf never smoker referred to pulmonary clinic in [place]  02/20/2021 by Dr  Lemar Lofty for cough onset of recurrent/ episodic "bronchitis" typically "over the xmas holidays" rx with abx/ prednisone then around 04/2018 pattern "came and stayed " until L thryoidectomy benign September 2022 by Benjamine Mola seemed some better p that but breathing no better.   Prior to chronic symptoms  baseline wt  around 150 lb     History of Present Illness  02/20/2021  Pulmonary/ 1st office eval/ Adric Wrede / [place] [facility]  Chief Complaint  Patient presents with   Consult    SOB and coughing episodes for a year and a half per pt. Ref by PCP.   Dyspnea:  mailbox and back x 100 ft and uphill back is a struggle and has to slow down whereas no problem prior to onset  Cough: sensation of pnds/ throat clearing x 2020  Sleep: bed flat / 30 degree  with pillows does fine  SABA use: may have helped  Covid spring  2022 no real change in chronic symptoms  Rec Pantoprazole (protonix) 40 mg   Take  30-60 min before first meal of the day and Pepcid (famotidine)  20 mg after supper until return to office   Stop fosamax for the next 6 weeks  GERD diet reviewed, bed blocks rec  To get the most out of exercise, you need to be continuously aware that you are short of breath, but never out of breath, for at least 30 minutes daily.  Make sure you check your oxygen saturations at highest level of activity several time a week  Ok to try albuterol (ventolin) 15 min before an activity (on alternating days)  that you know would usually make you short of breath    04/03/2021  f/u ov/[place] office/Odis Turck re: uacs/ doe maint on gerd rx   Chief Complaint  Patient presents with   Follow-up    Patients SOB and cough have improved since last OV.    Dyspnea:  outwalking husband now  Cough: none / no globus  Sleeping: fine/ still 30  degrees SABA use: once didn't help 02: none  Covid status: vax     No obvious day to day or daytime variability or assoc excess/ purulent sputum or mucus plugs or hemoptysis or cp or chest tightness, subjective wheeze or overt sinus or hb symptoms.   Sleeping  without nocturnal  or early am exacerbation  of respiratory  c/o's or need for noct saba. Also denies any obvious fluctuation of symptoms with weather or environmental changes or other aggravating or alleviating factors except as outlined above   No unusual exposure hx or h/o childhood pna/ asthma or knowledge of premature birth.  Current Allergies, Complete Past Medical History, Past Surgical History, Family History, and Social History were reviewed in [facility] record.  ROS  The following are not active complaints unless bolded Hoarseness, sore throat, dysphagia, dental problems, itching, sneezing,  nasal congestion or discharge of excess mucus or purulent secretions, ear ache,   fever, chills, sweats, unintended wt loss or wt gain, classically pleuritic or exertional cp,  orthopnea pnd or arm/hand swelling  or leg swelling, presyncope, palpitations, abdominal pain, anorexia, nausea, vomiting, diarrhea  or change in bowel habits or change in bladder habits, change in stools or change in urine, dysuria, hematuria,  rash, arthralgias, visual complaints,  headache, numbness, weakness or ataxia or problems with walking or coordination,  change in mood or  memory.        Current Meds  Medication Sig   albuterol (VENTOLIN HFA) 108 (90 Base) MCG/ACT inhaler TAKE 2 PUFFS BY MOUTH EVERY 6 HOURS AS NEEDED FOR WHEEZE OR SHORTNESS OF BREATH   amLODipine (NORVASC) 5 MG tablet Take 1 tablet (5 mg total) by mouth daily. (Patient taking differently: Take 5 mg by mouth every evening.)   Carboxymethylcellul-Glycerin (LUBRICATING EYE DROPS OP) Place 1 drop into both eyes daily as needed (dry eyes).   famotidine (PEPCID) 20 MG  tablet One after supper   levothyroxine (SYNTHROID) 125 MCG tablet Take 1 tablet (125 mcg total) by mouth daily. (Patient taking differently: Take 125 mcg by mouth daily before breakfast.)   Menthol, Topical Analgesic, (ICY HOT) 16 % LIQD Apply 1 application topically daily as needed (pain).   pantoprazole (PROTONIX) 40 MG tablet Take 1 tablet (40 mg total) by mouth daily. Take 30-60 min before first meal of the day   rosuvastatin (CRESTOR) 10 MG tablet Take 1 tablet (10 mg total) by mouth daily.   Vitamin D, Ergocalciferol, (DRISDOL) 1.25 MG (50000 UNIT) CAPS capsule Take 50,000 Units by mouth every Monday.           Past Medical History:  Diagnosis Date   COVID-19 12/02/2020   Hepatitis C 1992   s/p treatment.  she has cleared virus   HTN (hypertension)    Thyroid disease    Vitiligo       Objective:      04/03/2021      176   02/20/21 174 lb 1.9 oz (79 kg)  12/30/20 172 lb 3.2 oz (78.1 kg)  12/16/20 172 lb 6.4 oz (78.2 kg)     Vital signs reviewed  04/03/2021  - Note at rest 02 sats  99% on RA   General appearance:    amb pleasant wf nad   HEENT : pt wearing mask not removed for exam due to covid -19 concerns.    NECK :  without JVD/Nodes/TM/ nl carotid upstrokes bilaterally   LUNGS: no acc muscle use,  Nl contour chest which is clear to A and P bilaterally without cough on insp or exp maneuvers   CV:  RRR  no s3 or murmur or increase in P2, and no edema   ABD:  soft and nontender with nl inspiratory excursion in the supine position. No bruits or organomegaly appreciated, bowel sounds nl  MS:  Nl gait/ ext warm without deformities, calf tenderness, cyanosis or clubbing No obvious joint restrictions   SKIN: warm and dry without lesions    NEURO:  alert, approp, nl sensorium with  no motor or cerebellar deficits apparent.                     Assessment

## 2021-04-03 NOTE — Patient Instructions (Signed)
Same treatment another 6 weeks or until you see your PCP  If you do great over the holidays it would be good indication you are on the right track  After 3 months of effective therapy ok to wean off reflux medications and consider reclast or prolia    If you are satisfied with your treatment plan,  let your doctor know and he/she can either refill your medications or you can return here when your prescription runs out.     If in any way you are not 100% satisfied,  please tell us.  If 100% better, tell your friends!  Pulmonary follow up is as needed

## 2021-04-03 NOTE — Assessment & Plan Note (Signed)
Onset around 2020  - neg GXT  09/20/2020 [place]  - 02/20/2021   Walked on RA  x  3  lap(s) =  approx 459ft @ fast to very fast pace, stopped due to end of study fighting urge to clear throat, min sob with lowest 02 sats 97%  - 02/20/2021 trial of gerd rx and reconditioning >>> resolved to her satisfaction 04/03/2021 as did her upper airway cough off fosamax and on gerd rx  Not completely clear as to cause and effect but she's doing so much better with rx of GERD and off biphosphonates I suspect the two were related to her refractory symptoms and would consider IV reclast or IM prolia instead and try weaning gerd rx (esp ppi ) p 1st establishing 3 full months of improvement on present rx  >>> defer f/u re GERD to her PCP/ GI prn and f/u here prn as well          Each maintenance medication was reviewed in detail including emphasizing most importantly the difference between maintenance and prns and under what circumstances the prns are to be triggered using an action plan format where appropriate.  Total time for H and P, chart review, counseling,  and generating customized AVS unique to this summary final  office visit / same day charting =  25 min

## 2021-04-21 ENCOUNTER — Telehealth: Payer: Medicare Other | Admitting: Physician Assistant

## 2021-04-21 DIAGNOSIS — B9689 Other specified bacterial agents as the cause of diseases classified elsewhere: Secondary | ICD-10-CM | POA: Diagnosis not present

## 2021-04-21 DIAGNOSIS — J019 Acute sinusitis, unspecified: Secondary | ICD-10-CM | POA: Diagnosis not present

## 2021-04-21 MED ORDER — AMOXICILLIN-POT CLAVULANATE 875-125 MG PO TABS: 1.0000 | ORAL_TABLET | Freq: Two times a day (BID) | ORAL | 0 refills | Status: DC

## 2021-04-21 NOTE — Progress Notes (Signed)
I have spent 5 minutes in review of e-visit questionnaire, review and updating patient chart, medical decision making and response to patient.   Linah Klapper Cody Leonna Schlee, PA-C    

## 2021-04-21 NOTE — Progress Notes (Signed)

## 2021-04-27 ENCOUNTER — Other Ambulatory Visit: Payer: Self-pay | Admitting: Family Medicine

## 2021-04-27 ENCOUNTER — Other Ambulatory Visit: Payer: Self-pay | Admitting: Endocrinology

## 2021-04-27 DIAGNOSIS — I10 Essential (primary) hypertension: Secondary | ICD-10-CM

## 2021-04-29 ENCOUNTER — Other Ambulatory Visit: Payer: Self-pay | Admitting: Internal Medicine

## 2021-07-03 ENCOUNTER — Other Ambulatory Visit: Payer: Self-pay | Admitting: Family Medicine

## 2021-07-03 ENCOUNTER — Encounter: Payer: Self-pay | Admitting: Family Medicine

## 2021-07-03 DIAGNOSIS — I10 Essential (primary) hypertension: Secondary | ICD-10-CM

## 2021-07-03 NOTE — Telephone Encounter (Signed)
LMTCB TO SCHEDULE APPT FOR MED REFILL ?LETTER MAILED ?

## 2021-07-03 NOTE — Telephone Encounter (Signed)
[place]. NTBS 30 days 04/27/21 ?

## 2021-07-07 ENCOUNTER — Encounter: Payer: Self-pay | Admitting: Family Medicine

## 2021-07-07 ENCOUNTER — Ambulatory Visit: Payer: Medicare Other | Admitting: Family Medicine

## 2021-07-07 VITALS — BP 132/83 | HR 84 | Temp 97.8°F | Ht 66.0 in | Wt 157.6 lb

## 2021-07-07 DIAGNOSIS — R0609 Other forms of dyspnea: Secondary | ICD-10-CM

## 2021-07-07 DIAGNOSIS — E039 Hypothyroidism, unspecified: Secondary | ICD-10-CM | POA: Diagnosis not present

## 2021-07-07 DIAGNOSIS — M810 Age-related osteoporosis without current pathological fracture: Secondary | ICD-10-CM | POA: Diagnosis not present

## 2021-07-07 DIAGNOSIS — E890 Postprocedural hypothyroidism: Secondary | ICD-10-CM | POA: Diagnosis not present

## 2021-07-07 NOTE — Progress Notes (Signed)
? ?Subjective: ?CC: Osteoporosis ?PCP: [name], DO ?HWE:Courtney White is a 66 y.o. female presenting to clinic today for: ? ?1.  Osteoporosis ?Patient was previously treated with bisphosphonate.  This was discontinued as she was having uncontrolled GERD that was thought to be causing her pulmonary symptoms.  She is doing well on Protonix as prescribed.  However, she is interested in treating her bones.  She would be willing to proceed with Prolia if it is affordable. ? ?2.  Hypothyroidism status post partial thyroidectomy ?Patient is compliant with Synthroid 125 mcg daily.  No reports of change in voice, difficulty swallowing, tremor or heart palpitations.  No changes in bowel habits ? ? ?ROS: Per HPI ? ?No Known Allergies ?Past Medical History:  ?Diagnosis Date  ? COVID-19 12/02/2020  ? Hepatitis C 1992  ? s/p treatment.  she has cleared virus  ? HTN (hypertension)   ? Thyroid disease   ? Vitiligo   ? ? ?Current Outpatient Medications:  ?  albuterol (VENTOLIN HFA) 108 (90 Base) MCG/ACT inhaler, TAKE 2 PUFFS BY MOUTH EVERY 6 HOURS AS NEEDED FOR WHEEZE OR SHORTNESS OF BREATH, Disp: 8.5 each, Rfl: 0 ?  amLODipine (NORVASC) 5 MG tablet, Take 1 tablet (5 mg total) by mouth daily. (NEEDS TO BE SEEN BEFORE NEXT REFILL), Disp: 30 tablet, Rfl: 0 ?  amoxicillin-clavulanate (AUGMENTIN) 875-125 MG tablet, Take 1 tablet by mouth 2 (two) times daily., Disp: 14 tablet, Rfl: 0 ?  Carboxymethylcellul-Glycerin (LUBRICATING EYE DROPS OP), Place 1 drop into both eyes daily as needed (dry eyes)., Disp: , Rfl:  ?  famotidine (PEPCID) 20 MG tablet, One after supper, Disp: 30 tablet, Rfl: 11 ?  fluticasone (FLONASE) 50 MCG/ACT nasal spray, Place 1 spray into both nostrils daily. (Patient not taking: Reported on 04/03/2021), Disp: , Rfl:  ?  levothyroxine (SYNTHROID) 125 MCG tablet, Take 1 tablet (125 mcg total) by mouth daily. (Patient taking differently: Take 125 mcg by mouth daily before breakfast.), Disp: 90 tablet,  Rfl: 3 ?  Menthol, Topical Analgesic, (ICY HOT) 16 % LIQD, Apply 1 application topically daily as needed (pain)., Disp: , Rfl:  ?  pantoprazole (PROTONIX) 40 MG tablet, TAKE 1 TABLET (40 MG TOTAL) BY MOUTH DAILY. TAKE 30-60 MIN BEFORE FIRST MEAL OF THE DAY, Disp: 90 tablet, Rfl: 0 ?  rosuvastatin (CRESTOR) 10 MG tablet, Take 1 tablet (10 mg total) by mouth daily., Disp: 90 tablet, Rfl: 3 ?  Vitamin D, Ergocalciferol, (DRISDOL) 1.25 MG (50000 UNIT) CAPS capsule, Take 50,000 Units by mouth every Monday., Disp: , Rfl:  ?Social History  ? ?Socioeconomic History  ? Marital status: Significant Other  ?  Spouse name: Not on file  ? Number of children: 0  ? Years of education: Not on file  ? Highest education level: Not on file  ?Occupational History  ? Not on file  ?Tobacco Use  ? Smoking status: Never  ? Smokeless tobacco: Never  ?Vaping Use  ? Vaping Use: Never used  ?Substance and Sexual Activity  ? Alcohol use: Not on file  ?  Comment: occ  ? Drug use: Not Currently  ? Sexual activity: Yes  ?  Birth control/protection: Post-menopausal  ?Other Topics Concern  ? Not on file  ?Social History Narrative  ? Patient is a retired [employer] architect.  She is a widower and notes that she moved from the [place] area after being reunited with a high school sweetheart whom she is now engaged to.  ? She  lives locally in [place].  ? No children.  ? ?Social Determinants of Health  ? ?Financial Resource Strain: Not on file  ?Food Insecurity: Not on file  ?Transportation Needs: Not on file  ?Physical Activity: Not on file  ?Stress: Not on file  ?Social Connections: Not on file  ?Intimate Partner Violence: Not on file  ? ?Family History  ?Problem Relation Age of Onset  ? Diabetes Mother   ? Depression Mother   ? Hypertension Mother   ? Lung cancer Father   ? Hypertension Sister   ? Hypertension Sister   ? Thyroid disease Maternal Aunt   ? ? ?Objective: ?Office vital signs reviewed. ?BP 132/83   Pulse 84   Temp 97.8 ?F (36.6 ?C)  (Temporal)   Ht 5' 6" (1.676 m)   Wt 157 lb 9.6 oz (71.5 kg)   LMP  (LMP Unknown)   SpO2 97%   BMI 25.44 kg/m?  ? ?Physical Examination:  ?General: Awake, alert, well nourished, No acute distress ?HEENT: Sclera white.  No exophthalmos. ?Cardio: regular rate and rhythm, S1S2 heard, no murmurs appreciated ?Pulm: clear to auscultation bilaterally, no wheezes, rhonchi or rales; normal work of breathing on room air ?Neuro: No tremor ? ?Assessment/ Plan: ?66 y.o. female  ? ?Acquired hypothyroidism - Plan: TSH, T4, Free, CMP14+EGFR, Lipid Panel ? ?S/P partial thyroidectomy ? ?Age-related osteoporosis without current pathological fracture - Plan: CMP14+EGFR ? ?Dyspnea on exertion ? ?Asymptomatic from a thyroid standpoint.  Check thyroid levels, CMP and lipid panel given history of partial thyroidectomy ? ?We will check her calcium level and renal function for the osteoporosis.  Anticipate initiation of Prolia and I have reached out to the Prolia pool about this.  I doubt that she would be able to tolerate another bisphosphonate given uncontrolled GERD that caused pulmonary symptoms. ? ?Dyspnea on exertion seems to be improving with the PPI. ? ?No orders of the defined types were placed in this encounter. ? ?No orders of the defined types were placed in this encounter. ? ? ? ?[name], DO ?[facility] ?[phone] ? ? ?

## 2021-07-08 LAB — CMP14+EGFR
ALT: 21 IU/L (ref 0–32)
AST: 24 IU/L (ref 0–40)
Albumin/Globulin Ratio: 1.8 (ref 1.2–2.2)
Albumin: 4.6 g/dL (ref 3.8–4.8)
Alkaline Phosphatase: 65 IU/L (ref 44–121)
BUN/Creatinine Ratio: 19 (ref 12–28)
BUN: 12 mg/dL (ref 8–27)
Bilirubin Total: 0.5 mg/dL (ref 0.0–1.2)
CO2: 22 mmol/L (ref 20–29)
Calcium: 10.1 mg/dL (ref 8.7–10.3)
Chloride: 104 mmol/L (ref 96–106)
Creatinine, Ser: 0.63 mg/dL (ref 0.57–1.00)
Globulin, Total: 2.5 g/dL (ref 1.5–4.5)
Glucose: 101 mg/dL — ABNORMAL HIGH (ref 70–99)
Potassium: 4.4 mmol/L (ref 3.5–5.2)
Sodium: 140 mmol/L (ref 134–144)
Total Protein: 7.1 g/dL (ref 6.0–8.5)
eGFR: 98 mL/min/{1.73_m2} (ref >59)

## 2021-07-08 LAB — LIPID PANEL
Chol/HDL Ratio: 2.6 ratio (ref 0.0–4.4)
Cholesterol, Total: 143 mg/dL (ref 100–199)
HDL: 56 mg/dL (ref >39)
LDL Chol Calc (NIH): 71 mg/dL (ref 0–99)
Triglycerides: 82 mg/dL (ref 0–149)
VLDL Cholesterol Cal: 16 mg/dL (ref 5–40)

## 2021-07-08 LAB — T4, FREE: Free T4: 1.6 ng/dL (ref 0.82–1.77)

## 2021-07-08 LAB — TSH: TSH: 1.2 u[IU]/mL (ref 0.450–4.500)

## 2021-07-12 NOTE — Progress Notes (Signed)
Benefits verification submitted  ?

## 2021-07-26 ENCOUNTER — Other Ambulatory Visit: Payer: Self-pay | Admitting: Internal Medicine

## 2021-08-14 ENCOUNTER — Other Ambulatory Visit: Payer: Self-pay | Admitting: Endocrinology

## 2021-08-14 ENCOUNTER — Other Ambulatory Visit: Payer: Self-pay | Admitting: Family Medicine

## 2021-08-14 DIAGNOSIS — I10 Essential (primary) hypertension: Secondary | ICD-10-CM

## 2021-08-16 ENCOUNTER — Other Ambulatory Visit: Payer: Self-pay | Admitting: Family Medicine

## 2021-08-16 ENCOUNTER — Telehealth: Payer: Self-pay | Admitting: Family Medicine

## 2021-08-16 MED ORDER — VITAMIN D (ERGOCALCIFEROL) 1.25 MG (50000 UNIT) PO CAPS: 50000.0000 [IU] | ORAL_CAPSULE | ORAL | 2 refills | Status: DC

## 2021-08-16 NOTE — Telephone Encounter (Signed)
?  Prescription Request ? ?08/16/2021 ? ?Is this a "Controlled Substance" medicine? NO ? ?Have you seen your PCP in the last 2 weeks? No pt was seen on 07/07/21 by PCP ?6 Month appt scheduled for 01/08/22 ? ?If YES, route message to pool  -  If NO, patient needs to be scheduled for appointment. ? ?What is the name of the medication or equipment? Vitamin D, Ergocalciferol, (DRISDOL) 1.25 MG (50000 UNIT) CAPS capsule ? ?Have you contacted your pharmacy to request a refill? Yes  ? ?Which pharmacy would you like this sent to? [facility] ? ? ?Patient notified that their request is being sent to the clinical staff for review and that they should receive a response within 2 business days.  ? ? ?

## 2021-08-16 NOTE — Telephone Encounter (Signed)
done

## 2021-10-04 ENCOUNTER — Telehealth: Payer: Self-pay | Admitting: *Deleted

## 2021-10-04 DIAGNOSIS — M810 Age-related osteoporosis without current pathological fracture: Secondary | ICD-10-CM

## 2021-10-04 MED ORDER — DENOSUMAB 60 MG/ML ~~LOC~~ SOSY: 60.0000 mg | PREFILLED_SYRINGE | Freq: Once | SUBCUTANEOUS | 0 refills | Status: AC

## 2021-10-04 NOTE — Telephone Encounter (Signed)
New start Prolia - send to pharm    [facility] to [facility] - she will pick up and bring to appt next week if everything works out.  Aware to call me back with any issues ot questions.

## 2021-11-10 ENCOUNTER — Other Ambulatory Visit: Payer: Self-pay | Admitting: Family

## 2021-12-01 ENCOUNTER — Other Ambulatory Visit: Payer: Self-pay | Admitting: Family Medicine

## 2021-12-01 NOTE — Telephone Encounter (Signed)
Last OV 07/07/21. Last RF 08/21/21. Next OV 12/22/21

## 2022-01-08 ENCOUNTER — Encounter: Payer: Self-pay | Admitting: Family Medicine

## 2022-01-08 ENCOUNTER — Ambulatory Visit: Payer: Medicare Other | Admitting: Family Medicine

## 2022-01-08 VITALS — BP 124/77 | HR 87 | Temp 98.1°F | Ht 66.0 in | Wt 157.8 lb

## 2022-01-08 DIAGNOSIS — E890 Postprocedural hypothyroidism: Secondary | ICD-10-CM | POA: Diagnosis not present

## 2022-01-08 DIAGNOSIS — R739 Hyperglycemia, unspecified: Secondary | ICD-10-CM

## 2022-01-08 DIAGNOSIS — M810 Age-related osteoporosis without current pathological fracture: Secondary | ICD-10-CM

## 2022-01-08 DIAGNOSIS — Z23 Encounter for immunization: Secondary | ICD-10-CM

## 2022-01-08 DIAGNOSIS — G8929 Other chronic pain: Secondary | ICD-10-CM

## 2022-01-08 DIAGNOSIS — M5481 Occipital neuralgia: Secondary | ICD-10-CM | POA: Diagnosis not present

## 2022-01-08 DIAGNOSIS — R519 Headache, unspecified: Secondary | ICD-10-CM

## 2022-01-08 DIAGNOSIS — M778 Other enthesopathies, not elsewhere classified: Secondary | ICD-10-CM

## 2022-01-08 DIAGNOSIS — R0789 Other chest pain: Secondary | ICD-10-CM

## 2022-01-08 NOTE — Patient Instructions (Addendum)
Voltaren gel 2-3 times daily to that left elbow. Let me know if it gets any worse/ doesn't go away and I can refer to PT next door for rehab CT coronary artery evaluation ordered.  You'll be called to schedule Will check into Prolia for you  Occipital Neuralgia  Occipital neuralgia is a type of headache that causes brief episodes of very bad pain in the back of the head. Pain from occipital neuralgia may spread (radiate) to other parts of the head. These headaches may be caused by irritation of the nerves that leave the spinal cord high up in the neck, just below the base of the skull (occipital nerves). The occipital nerves transmit sensations from the back of the head, the top of the head, and the areas behind the ears. What are the causes? This condition can occur without any known cause (primary headache syndrome). In other cases, this condition is caused by pressure on or irritation of one of the two occipital nerves. Pressure and irritation may be due to: Muscle spasm in the neck. Neck injury. Wear and tear of the vertebrae in the neck (osteoarthritis). Disease of the disks that separate the vertebrae. Swollen blood vessels that put pressure on the occipital nerves. Infections. Tumors. Diabetes. What are the signs or symptoms? This condition causes brief burning, stabbing, electric, shocking, or shooting pain in the back of the head that can radiate to the top of the head. It can happen on one side or both sides of the head. It can also cause: Pain behind the eye. Pain triggered by neck movement or hair brushing. Scalp tenderness. Aching in the back of the head between episodes of very bad pain. Pain that gets worse with exposure to bright lights. How is this diagnosed? Your health care provider may diagnose the condition based on a physical exam and your symptoms. Tests may be done, such as: Imaging studies of the brain and neck (cervical spine), such as an MRI or CT scan. These  look for causes of pinched nerves. Applying pressure to the nerves in the neck to try to re-create the pain. Injection of numbing medicine into the occipital nerve areas to see if pain goes away (diagnostic nerve block). How is this treated? Treatment for this condition may begin with simple measures, such as: Rest. Massage. Applying heat or cold to the area. Over-the-counter pain relievers. If these measures do not work, you may need other treatments, including: Medicines, such as: Prescription-strength anti-inflammatory medicines. Muscle relaxants. Anti-seizure medicines, which can relieve pain. Antidepressants, which can relieve pain. Injected medicines, such as medicines that numb the area (local anesthetic) and steroids. Pulsed radiofrequency ablation. This is when wires are implanted to deliver electrical impulses that block pain signals from the occipital nerve. Surgery to relieve nerve pressure. Physical therapy. Follow these instructions at home: Managing pain     Avoid any activities that cause pain. Rest when you have an attack of pain. Try gentle massage to relieve pain. Try a different pillow or sleeping position. If directed, apply heat to the affected area as often as told by your health care provider. Use the heat source that your health care provider recommends, such as a moist heat pack or a heating pad. Place a towel between your skin and the heat source. Leave the heat on for 20-30 minutes. Remove the heat if your skin turns bright red. This is especially important if you are unable to feel pain, heat, or cold. You have a greater risk  of getting burned. If directed, put ice on the back of your head and neck area. To do this: Put ice in a plastic bag. Place a towel between your skin and the bag. Leave the ice on for 20 minutes, 2-3 times a day. Remove the ice if your skin turns bright red. This is very important. If you cannot feel pain, heat, or cold, you have  a greater risk of damage to the area. General instructions Take over-the-counter and prescription medicines only as told by your health care provider. Avoid things that make your symptoms worse, such as bright lights. Try to stay active. Get regular exercise that does not cause pain. Ask your health care provider to suggest safe exercises for you. Work with a physical therapist to learn stretching exercises you can do at home. Practice good posture. Keep all follow-up visits. This is important. Contact a health care provider if: Your medicine is not working. You have new or worsening symptoms. Get help right away if: You have very bad head pain that does not go away. You have a sudden change in vision, balance, or speech. These symptoms may represent a serious problem that is an emergency. Do not wait to see if the symptoms will go away. Get medical help right away. Call your local emergency services (911 in the [place] Do not drive yourself to the hospital. Summary Occipital neuralgia is a type of headache that causes brief episodes of very bad pain in the back of the head. Pain from occipital neuralgia may spread (radiate) to other parts of the head. Treatment for this condition includes rest, massage, and medicines. This information is not intended to replace advice given to you by your health care provider. Make sure you discuss any questions you have with your health care provider. Document Revised: 02/07/2020 Document Reviewed: 02/07/2020 Elsevier Patient Education  [place].

## 2022-01-08 NOTE — Progress Notes (Addendum)
Subjective: CC: Hypothyroidism, osteoporosis PCP: [name], DO [id number] J Trabucco is a 66 y.o. female presenting to clinic today for:  1.  Hypothyroidism Reports compliance with thyroid.  Denies any difficulty swallowing but still has some laryngeal irritation.  This had previously resolved but has returned.  She reports no changes in bowel habits but does report low energy.  She continues to get some dyspnea with exertion, she describes specifically bringing something up 3 flights of stairs winding her.  She had cardiac evaluation with a stress test but wants to get coronary artery calcium score done as she is not confident that the stress test was accurate  2.  Osteoporosis Interested in [place] but wanted to have more detailed conversation about this.  DEXA scan was performed in July 2022 and demonstrated a T score of -2.7 at the left femoral neck.  She was doing some weightbearing exercises but that has slacked off slightly  3.  Left elbow pain Patient reports elbow pain.  She notes it is actually slightly better today.  No gross swelling or injury reported.  Sometimes it hurts with touching or certain movements.  She is been utilizing ibuprofen for treatment of this  4.  Headaches Patient reports intermittent sharp stabbing pain in the left side of her head.  She points to the occipital/parietal region of the scalp.  She denies this early electric type sensation but it is a short sustained stabbing/poking pain.  It only lasts a few seconds before resolving but can occur randomly.  This week she has not experienced it but it was pretty consistent last week.  She admits to being under more stress due to the illness of her husband.   ROS: Per HPI  No Known Allergies Past Medical History:  Diagnosis Date   COVID-19 12/02/2020   Hepatitis C 1992   s/p treatment.  she has cleared virus   HTN (hypertension)    Thyroid disease    Vitiligo     Current Outpatient  Medications:    albuterol (VENTOLIN HFA) 108 (90 Base) MCG/ACT inhaler, TAKE 2 PUFFS BY MOUTH EVERY 6 HOURS AS NEEDED FOR WHEEZE OR SHORTNESS OF BREATH, Disp: 8.5 each, Rfl: 0   amLODipine (NORVASC) 5 MG tablet, Take 1 tablet (5 mg total) by mouth daily., Disp: 90 tablet, Rfl: 3   Carboxymethylcellul-Glycerin (LUBRICATING EYE DROPS OP), Place 1 drop into both eyes daily as needed (dry eyes)., Disp: , Rfl:    famotidine (PEPCID) 20 MG tablet, One after supper, Disp: 30 tablet, Rfl: 11   levothyroxine (SYNTHROID) 125 MCG tablet, Take 1 tablet (125 mcg total) by mouth daily before breakfast., Disp: 90 tablet, Rfl: 3   Menthol, Topical Analgesic, (ICY HOT) 16 % LIQD, Apply 1 application topically daily as needed (pain)., Disp: , Rfl:    pantoprazole (PROTONIX) 40 MG tablet, TAKE 1 TABLET (40 MG TOTAL) BY MOUTH DAILY. TAKE 30-60 MIN BEFORE FIRST MEAL OF THE DAY, Disp: 90 tablet, Rfl: 3   rosuvastatin (CRESTOR) 10 MG tablet, TAKE 1 TABLET BY MOUTH EVERY DAY, Disp: 90 tablet, Rfl: 0   Vitamin D, Ergocalciferol, (DRISDOL) 1.25 MG (50000 UNIT) CAPS capsule, TAKE 1 CAPSULE (50,000 UNITS TOTAL) BY MOUTH EVERY MONDAY., Disp: 12 capsule, Rfl: 2 Social History   Socioeconomic History   Marital status: Significant Other    Spouse name: Not on file   Number of children: 0   Years of education: Not on file   Highest education level: Not on file  Occupational History   Not on file  Tobacco Use   Smoking status: Never   Smokeless tobacco: Never  Vaping Use   Vaping Use: Never used  Substance and Sexual Activity   Alcohol use: Not on file    Comment: occ   Drug use: Not Currently   Sexual activity: Yes    Birth control/protection: Post-menopausal  Other Topics Concern   Not on file  Social History Narrative   Patient is a retired [employer].  She is a widower and notes that she moved from the [place] area after being reunited with a high school sweetheart whom she is now engaged to.   She  lives locally in [place].   No children.   Social Determinants of Health   Financial Resource Strain: Not on file  Food Insecurity: Not on file  Transportation Needs: Not on file  Physical Activity: Not on file  Stress: Not on file  Social Connections: Not on file  Intimate Partner Violence: Not on file   Family History  Problem Relation Age of Onset   Diabetes Mother    Depression Mother    Hypertension Mother    Lung cancer Father    Hypertension Sister    Hypertension Sister    Thyroid disease Maternal Aunt     Objective: Office vital signs reviewed. BP 124/77   Pulse 87   Temp 98.1 F (36.7 C)   Ht '5\' 6"'$  (1.676 m)   Wt 157 lb 12.8 oz (71.6 kg)   LMP  (LMP Unknown)   SpO2 97%   BMI 25.47 kg/m   Physical Examination:  General: Awake, alert, well-appearing, nontoxic female, No acute distress HEENT: Sclera white.  Well-healed postsurgical scar noted at the base of the neck.  No exophthalmos  Cardio: regular rate and rhythm, S1S2 heard, no murmurs appreciated Pulm: clear to auscultation bilaterally, no wheezes, rhonchi or rales; normal work of breathing on room air Extremities: warm, well perfused, No edema, cyanosis or clubbing; +2 pulses bilaterally MSK: Normal gait and station.  Normal tone.  Left elbow nontender on exam.  No soft tissue swelling, erythema or warmth appreciated.  No bursitis.  She has full active range of motion Skin: dry; intact; no rashes or lesions Neuro: No tremor.  No focal neurologic deficits appreciated Psych: Mood stable, speech normal, affect appropriate     01/08/2022   10:09 AM 01/08/2022    9:52 AM 07/07/2021   11:02 AM  Depression screen PHQ 2/9  Decreased Interest 0 0 0  Down, Depressed, Hopeless 0 0 0  PHQ - 2 Score 0 0 0  Altered sleeping   1  Tired, decreased energy   0  Change in appetite   0  Feeling bad or failure about yourself    0  Trouble concentrating   0  Moving slowly or fidgety/restless   0  Suicidal  thoughts   0  PHQ-9 Score   1  Difficult doing work/chores   Not difficult at all      01/08/2022   10:09 AM 07/07/2021   11:02 AM 12/12/2020    1:35 PM  GAD 7 : Generalized Anxiety Score  Nervous, Anxious, on Edge 0 0 0  Control/stop worrying 0 0 0  Worry too much - different things 0 0 0  Trouble relaxing 0 0 0  Restless 0 0 0  Easily annoyed or irritable 0 0 0  Afraid - awful might happen 0 0 0  Total GAD 7  Score 0 0 0  Anxiety Difficulty Not difficult at all Not difficult at all Not difficult at all      Assessment/ Plan: 66 y.o. female   Postoperative hypothyroidism - Plan: TSH, T4, Free, PTH, Intact and Calcium  Elevated serum glucose - Plan: Basic Metabolic Panel  Age-related osteoporosis without current pathological fracture - Plan: VITAMIN D 25 Hydroxy (Vit-D Deficiency, Fractures)  Occipital neuralgia of left side - Plan: MR Brain Wo Contrast, AMB referral to headache clinic  Left elbow tendinitis  Atypical chest pain - Plan: CT CARDIAC SCORING (SELF PAY ONLY)  Need for immunization against influenza - Plan: Flu Vaccine QUAD High Dose(Fluad)  Chronic nonintractable headache, unspecified headache type - Plan: MR Brain Wo Contrast, AMB referral to headache clinic  Check thyroid levels, PTH, calcium.  BMP also collected given mild elevation in serum glucose noted.  Vitamin D collected for known osteoporosis.  We will CC [facility] nursing pool for further investigation as to how much this may cost the patient.  I suspect that the symptoms that she is experiencing on the left side of the scalp is occipital neuralgia.  I gave her handout about this  Left elbow is fairly asymptomatic today.  Continue topical NSAIDs so as to save any GI irritation with the orals.  Ice affected area.  Patient wished to proceed with CT cardiac scoring.  This has been ordered.  Further recommendations pending score  Influenza vaccination administered today  No orders of the defined  types were placed in this encounter.  No orders of the defined types were placed in this encounter.   **addendum: added MRI/ referral to neuro for ongoing headaches.  I still suspect they are neuralgia in nature but patient would like specialist eval.  [name], [place] [phone]

## 2022-01-09 ENCOUNTER — Other Ambulatory Visit: Payer: Self-pay | Admitting: *Deleted

## 2022-01-09 ENCOUNTER — Other Ambulatory Visit: Payer: Self-pay | Admitting: Family Medicine

## 2022-01-09 DIAGNOSIS — E890 Postprocedural hypothyroidism: Secondary | ICD-10-CM

## 2022-01-09 DIAGNOSIS — E039 Hypothyroidism, unspecified: Secondary | ICD-10-CM

## 2022-01-09 LAB — BASIC METABOLIC PANEL
BUN/Creatinine Ratio: 20 (ref 12–28)
BUN: 13 mg/dL (ref 8–27)
CO2: 24 mmol/L (ref 20–29)
Calcium: 10.3 mg/dL (ref 8.7–10.3)
Chloride: 103 mmol/L (ref 96–106)
Creatinine, Ser: 0.65 mg/dL (ref 0.57–1.00)
Glucose: 104 mg/dL — ABNORMAL HIGH (ref 70–99)
Potassium: 4.5 mmol/L (ref 3.5–5.2)
Sodium: 141 mmol/L (ref 134–144)
eGFR: 96 mL/min/{1.73_m2} (ref >59)

## 2022-01-09 LAB — T4, FREE: Free T4: 2.12 ng/dL — ABNORMAL HIGH (ref 0.82–1.77)

## 2022-01-09 LAB — PTH, INTACT AND CALCIUM: PTH: 52 pg/mL (ref 15–65)

## 2022-01-09 LAB — VITAMIN D 25 HYDROXY (VIT D DEFICIENCY, FRACTURES): Vit D, 25-Hydroxy: 41 ng/mL (ref 30.0–100.0)

## 2022-01-09 LAB — TSH: TSH: 1.09 u[IU]/mL (ref 0.450–4.500)

## 2022-01-09 MED ORDER — LEVOTHYROXINE SODIUM 112 MCG PO TABS: 112.0000 ug | ORAL_TABLET | Freq: Every day | ORAL | 3 refills | Status: DC

## 2022-01-09 NOTE — Progress Notes (Signed)
Orders Placed This Encounter  Procedures   TSH    Standing Status:   Future    Standing Expiration Date:   01/10/2023   T4, free    Standing Status:   Future    Standing Expiration Date:   01/10/2023

## 2022-01-09 NOTE — Progress Notes (Signed)
Sent in verification of benefits 09/19

## 2022-01-15 ENCOUNTER — Telehealth: Payer: Self-pay | Admitting: Family Medicine

## 2022-01-15 NOTE — Telephone Encounter (Signed)
Pt called stating that she recently had a visit with PCP and talked to her about her headaches. Says her headaches are getting worse and she would like for PCP to send referral for her to see a specialist or have a scan done of her head.  Please advise and call patient.

## 2022-01-16 NOTE — Telephone Encounter (Signed)
PT AWARE  

## 2022-01-16 NOTE — Telephone Encounter (Signed)
MRI and referral orders placed.

## 2022-01-16 NOTE — Addendum Note (Signed)
Addended by: [name] on: 01/16/2022 11:35 AM   Modules accepted: Orders

## 2022-01-23 ENCOUNTER — Telehealth: Payer: Self-pay | Admitting: Family Medicine

## 2022-01-23 DIAGNOSIS — R519 Headache, unspecified: Secondary | ICD-10-CM

## 2022-01-23 DIAGNOSIS — M5481 Occipital neuralgia: Secondary | ICD-10-CM

## 2022-01-23 NOTE — Telephone Encounter (Signed)
She is already scheduled for MRI without.  I will cc Courtney to see if this can be changed and she keep the same appt.

## 2022-01-23 NOTE — Telephone Encounter (Signed)
Dr Trena Platt office called stating that they received pts referral and Dr Domingo Cocking wants PCP to order MRI with and without contrast.   Pt scheduled on 02/06/22.

## 2022-01-25 NOTE — Telephone Encounter (Signed)
Calling to see if MRI has been changed with and without contrast. Please call.

## 2022-01-30 NOTE — Telephone Encounter (Signed)
done

## 2022-02-06 ENCOUNTER — Ambulatory Visit (HOSPITAL_COMMUNITY): Payer: Medicare Other

## 2022-02-06 ENCOUNTER — Ambulatory Visit (HOSPITAL_COMMUNITY)
Admission: RE | Admit: 2022-02-06 | Discharge: 2022-02-06 | Disposition: A | Discharge status: Home / Self Care | Payer: Medicare Other | Source: Ambulatory Visit | Attending: Family Medicine | Admitting: Family Medicine

## 2022-02-06 DIAGNOSIS — R519 Headache, unspecified: Secondary | ICD-10-CM | POA: Insufficient documentation

## 2022-02-06 DIAGNOSIS — G8929 Other chronic pain: Secondary | ICD-10-CM | POA: Insufficient documentation

## 2022-02-06 DIAGNOSIS — M5481 Occipital neuralgia: Secondary | ICD-10-CM | POA: Diagnosis present

## 2022-02-06 MED ORDER — GADOBUTROL 1 MMOL/ML IV SOLN
7.0000 mL | Freq: Once | INTRAVENOUS | Status: AC | PRN
  Administered 2022-02-06: 7 mL via INTRAVENOUS

## 2022-02-08 ENCOUNTER — Encounter: Payer: Self-pay | Admitting: Podiatrist

## 2022-02-08 ENCOUNTER — Ambulatory Visit: Payer: Medicare Other | Admitting: Podiatrist

## 2022-02-08 DIAGNOSIS — L600 Ingrowing nail: Secondary | ICD-10-CM | POA: Diagnosis not present

## 2022-02-08 DIAGNOSIS — L608 Other nail disorders: Secondary | ICD-10-CM | POA: Diagnosis not present

## 2022-02-08 DIAGNOSIS — B351 Tinea unguium: Secondary | ICD-10-CM

## 2022-02-08 DIAGNOSIS — Z79899 Other long term (current) drug therapy: Secondary | ICD-10-CM | POA: Diagnosis not present

## 2022-02-08 NOTE — Progress Notes (Signed)
Chief Complaint  Patient presents with   Ingrown Toenail     Possible ingrown toenail      HPI: Patient is 66 y.o. female who presents today for a painful ingrown left great toenail on the medial side. She has had this problem for several months and usually has the pedicurist trim out the corner.  The nail has become incurvated on this corner over time. She also has yellowing and thickness of the nail of which she would like treated as well.   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   Neck nodule 08/01/2020   DOE (dyspnea on exertion) 07/13/2020   Headache 07/13/2020   Educated about COVID-19 virus infection 11/24/2019   Precordial chest pain 11/24/2019   Enthesitis 10/22/2019   Acute right ankle pain 10/22/2019   Family history of dementia 03/31/2019   Stress at home 03/31/2019   Essential hypertension 03/31/2019   Vitiligo 02/25/2019   Tickle in throat 02/25/2019   Acquired hypothyroidism 02/25/2019   Vitamin D deficiency 02/25/2019   Elevated blood pressure reading in office without diagnosis of hypertension 02/25/2019    Current Outpatient Medications on File Prior to Visit  Medication Sig Dispense Refill   amLODipine (NORVASC) 5 MG tablet Take 1 tablet (5 mg total) by mouth daily. 90 tablet 3   Carboxymethylcellul-Glycerin (LUBRICATING EYE DROPS OP) Place 1 drop into both eyes daily as needed (dry eyes).     Ibuprofen 200 MG CAPS Take by mouth.     levothyroxine (SYNTHROID) 112 MCG tablet Take 1 tablet (112 mcg total) by mouth daily. 90 tablet 3   Menthol, Topical Analgesic, (ICY HOT) 16 % LIQD Apply 1 application topically daily as needed (pain).     pantoprazole (PROTONIX) 40 MG tablet TAKE 1 TABLET (40 MG TOTAL) BY MOUTH DAILY. TAKE 30-60 MIN BEFORE FIRST MEAL OF THE DAY 90 tablet 3   rosuvastatin (CRESTOR) 10 MG tablet TAKE 1 TABLET BY MOUTH EVERY DAY 90 tablet 0   Vitamin D,  Ergocalciferol, (DRISDOL) 1.25 MG (50000 UNIT) CAPS capsule TAKE 1 CAPSULE (50,000 UNITS TOTAL) BY MOUTH EVERY MONDAY. 12 capsule 2   No current facility-administered medications on file prior to visit.    No Known Allergies  Review of Systems No fevers, chills, nausea, muscle aches, no difficulty breathing, no calf pain, no chest pain or shortness of breath.   Physical Exam  GENERAL APPEARANCE: Alert, conversant. Appropriately groomed. No acute distress.   VASCULAR: Pedal pulses palpable 2/4 DP and 2/4 PT bilateral.  Capillary refill time is immediate to all digits,  Proximal to distal cooling is warm to warm.  Digital perfusion adequate.   NEUROLOGIC: sensation is intact to 5.07 monofilament at 5/5 sites bilateral.  Light touch is intact bilateral, vibratory sensation intact bilateral  MUSCULOSKELETAL: acceptable muscle strength, tone and stability bilateral.  No gross boney pedal deformities noted.  No pain, crepitus or limitation noted with foot and ankle range of motion bilateral.   DERMATOLOGIC: skin is warm, supple, and dry.  Left hallux nail has a pincer nail deformity with an ingrown medial border. Inflammation/ pain along this border is noted with palpation.  No redness, no streaking, no drainage noted.  No sign of acute bacterial infection noted.  Bilateral hallux nails are yellowed, thick, brittle with subungual debris noted.     Assessment     ICD-10-CM   1. Onychomycosis with ingrown toenail  B35.1  L60.0     2. Pincer nail deformity  L60.8     3. Encounter for long-term (current) use of high-risk medication  Z79.899 Hepatic function panel       Plan  Discussed treatment options and recommendations.  She would like to keep the width of the nail and was inquiring about a splint to straighten out the toenail.  I do not personally have experience with splinting of toenails and told her I would ask the other physicians at [facility].  (No one at our office uses  them). She is also interested in treatment of the fungus.  I will send her for a liver function test to see if she is a candidate for lamisil.  I will call with the result.  We also discussed laser therapy-  she will be seen for lasering of the toenails. Discussed it usually takes 3-4 treatments to see results.  I will call with the result of the hepatic function panel as well as inform her about the splint not being an option with our practice. Discussed that should the toe become more painful or infected, we would need to remove the corner. For now we will hold off.

## 2022-02-09 ENCOUNTER — Other Ambulatory Visit: Payer: Self-pay | Admitting: Podiatrist

## 2022-02-09 DIAGNOSIS — Z79899 Other long term (current) drug therapy: Secondary | ICD-10-CM

## 2022-02-09 LAB — HEPATIC FUNCTION PANEL
ALT: 26 IU/L (ref 0–32)
AST: 24 IU/L (ref 0–40)
Albumin: 4.8 g/dL (ref 3.9–4.9)
Alkaline Phosphatase: 92 IU/L (ref 44–121)
Bilirubin Total: 0.5 mg/dL (ref 0.0–1.2)
Bilirubin, Direct: 0.16 mg/dL (ref 0.00–0.40)
Total Protein: 7.5 g/dL (ref 6.0–8.5)

## 2022-02-09 MED ORDER — TERBINAFINE HCL 250 MG PO TABS: 250.0000 mg | ORAL_TABLET | Freq: Every day | ORAL | 2 refills | Status: DC

## 2022-02-09 NOTE — Progress Notes (Signed)
Hepatic function normal-  called in lamisil-  ordered repeat bloodwork for 30 days from today.

## 2022-02-15 ENCOUNTER — Telehealth: Payer: Self-pay | Admitting: *Deleted

## 2022-02-15 ENCOUNTER — Ambulatory Visit (HOSPITAL_BASED_OUTPATIENT_CLINIC_OR_DEPARTMENT_OTHER)
Admission: RE | Admit: 2022-02-15 | Discharge: 2022-02-15 | Disposition: A | Discharge status: Home / Self Care | Payer: Medicare Other | Source: Ambulatory Visit | Attending: Family Medicine | Admitting: Family Medicine

## 2022-02-15 DIAGNOSIS — R0789 Other chest pain: Secondary | ICD-10-CM | POA: Insufficient documentation

## 2022-02-15 NOTE — Telephone Encounter (Signed)
Patient is interested in the laser treatment but want to know what kind of laser machine is used, regrowth of the toenail before scheduling appointment.

## 2022-02-22 NOTE — Telephone Encounter (Signed)
Return call to patient for information per physician,no answer, left voice message for call back.

## 2022-02-23 ENCOUNTER — Telehealth: Payer: Self-pay | Admitting: Family Medicine

## 2022-02-23 DIAGNOSIS — R0609 Other forms of dyspnea: Secondary | ICD-10-CM

## 2022-02-23 DIAGNOSIS — R0789 Other chest pain: Secondary | ICD-10-CM

## 2022-02-23 NOTE — Telephone Encounter (Signed)
Pt made aware of all.  States that she does not want to go back to [facility] or Dr. Rosezella [place] office. Feels that nothing was completed or really done to help her.  Pt still requests to stay local for now but if her concerns are still not addressed she would like to go back to her providers in [place] area ( she did not say their names).

## 2022-02-23 NOTE — Telephone Encounter (Signed)
Referrals placed. I indicated she did not want to see the providers she had seen previously.

## 2022-02-23 NOTE — Telephone Encounter (Signed)
Pt has not heard anything from Dr. Lajuana Ripple about her lung/SOB concerns from her recent CT. [name] documented a note but not sure if it was sent to Dr. Lajuana Ripple. Will forward this message to Dr. Darnell Level for review. Pt also added that she becomes hoarse at times for no reason.

## 2022-02-23 NOTE — Telephone Encounter (Signed)
Looks like [name] LMTCB.  Still need to know WHERE she wants to be referred to cardiology.  Also, the SOB could be due to lung damage after COVID but also could be due to underlying cardiac disease.  Hence, the referral to cardiology.  I'm glad to also place referral to pulmonology if this interests her.  Both specialties have offices in [place] so maybe can coordinate to have them done on the same day?

## 2022-02-26 ENCOUNTER — Telehealth: Payer: Self-pay | Admitting: Internal Medicine

## 2022-02-26 ENCOUNTER — Telehealth: Payer: Self-pay | Admitting: *Deleted

## 2022-02-26 NOTE — Telephone Encounter (Signed)
Lmovm to let her know we would have to get approval from Dr. Melvyn Novas and once approved, will call to schedule appt. Please advise.

## 2022-02-26 NOTE — Telephone Encounter (Signed)
Fine with me but note this pt was given very specific instruction  on AVS re f/u if not 100% back to baseline a year ago with improvement documented by my nurse and me at last ov so not sure what the issue really is here  Would set up a 30 min ov with the doc of her choice

## 2022-02-26 NOTE — Telephone Encounter (Signed)
Please advise, is it ok for patient to change form Dr. Melvyn Novas to another provider here or at [facility] office?

## 2022-02-26 NOTE — Telephone Encounter (Signed)
I spoke with pt and she says over the weekend one of her friends mentioned they saw Dr Einar Gip (cardiology) and Dr Loanne Drilling at [facility].

## 2022-02-26 NOTE — Telephone Encounter (Signed)
Spoke with patient giving information per provider, wanted to know if we do nail splinting and would like to schedule appointment to begin the laser treatments.

## 2022-02-27 NOTE — Telephone Encounter (Signed)
Courtney White  Can we set up an office visit for this patient with a new provider at [facility] when we have a free spot.   I know we are not allowed to schedule office visits at [facility].  Please advise

## 2022-02-28 NOTE — Telephone Encounter (Signed)
Patient is scheduled with Dr. Loanne Drilling 03/09/22.  Patient told me when I reached her that she wanted to see Dr. Loanne Drilling although I gave her Dr. Halford Chessman and Dr. Elsworth Soho as our other providers for [facility] on message and when I spoke with her and could schedule at [facility] since our first available was not until January for [place].  Will close encounter.

## 2022-03-02 NOTE — Telephone Encounter (Signed)
Patient calling to check on referral for Cardiology, she would like to see Dr Einar Gip not [place]. Please call back and advise.

## 2022-03-09 ENCOUNTER — Encounter (HOSPITAL_BASED_OUTPATIENT_CLINIC_OR_DEPARTMENT_OTHER): Payer: Self-pay | Admitting: Pulmonary Disease

## 2022-03-09 ENCOUNTER — Ambulatory Visit (HOSPITAL_BASED_OUTPATIENT_CLINIC_OR_DEPARTMENT_OTHER): Payer: Medicare Other | Admitting: Pulmonary Disease

## 2022-03-09 VITALS — BP 124/70 | HR 80 | Ht 66.0 in | Wt 165.7 lb

## 2022-03-09 DIAGNOSIS — R0609 Other forms of dyspnea: Secondary | ICD-10-CM | POA: Diagnosis not present

## 2022-03-09 MED ORDER — AZITHROMYCIN 250 MG PO TABS: ORAL_TABLET | ORAL | 0 refills | Status: DC

## 2022-03-09 NOTE — Progress Notes (Signed)
Subjective:   PATIENT ID: Secret J Betsill GENDER: female DOB: Nov 02, 1955, MRN: [id number]  Chief Complaint  Patient presents with   Consult    Doe - when on a incline walking or bending over. Says breathing hasn't been the same since bronchitis    Reason for Visit: New consult for abnormal CT  [name] is a 66 year old female never smoker with osteoporosis, HTN, hypothyroidism, hx hepatitis C s/p treatment who presents as a new consult  Previously followed by [facility] in [place] for cough and shortness of breath. PPI trial was advised. Was lost to follow-up after last visit in 03/2021.  She recently had atelectasis seen on her cardiac CT. She has shortness of breath with activity including walking on the driveway which is inclined. Carrying suitcases one flight is ok but then feeling exhausted after multiple trips. Will have some wheezing. Has associated cough that is fairly controlled triggered by sinus issues. Denies history of chronic respiratory illness.   Has had mucopurulent sinus drainage and sinus pressure for over a week associated low grade fevers.  Social History: Never smoker  I have personally reviewed patient's past medical/family/social history, allergies, current medications.  Past Medical History:  Diagnosis Date   COVID-19 12/02/2020   Hepatitis C 1992   s/p treatment.  she has cleared virus   HTN (hypertension)    Thyroid disease    Vitiligo      Family History  Problem Relation Age of Onset   Diabetes Mother    Depression Mother    Hypertension Mother    Lung cancer Father    Hypertension Sister    Hypertension Sister    Thyroid disease Maternal Aunt      Social History   Occupational History   Not on file  Tobacco Use   Smoking status: Never   Smokeless tobacco: Never  Vaping Use   Vaping Use: Never used  Substance and Sexual Activity   Alcohol use: Not on file    Comment: occ   Drug use: Not Currently    Sexual activity: Yes    Birth control/protection: Post-menopausal    No Known Allergies   Outpatient Medications Prior to Visit  Medication Sig Dispense Refill   amLODipine (NORVASC) 5 MG tablet Take 1 tablet (5 mg total) by mouth daily. 90 tablet 3   Carboxymethylcellul-Glycerin (LUBRICATING EYE DROPS OP) Place 1 drop into both eyes daily as needed (dry eyes).     Ibuprofen 200 MG CAPS Take by mouth.     levothyroxine (SYNTHROID) 112 MCG tablet Take 1 tablet (112 mcg total) by mouth daily. 90 tablet 3   Menthol, Topical Analgesic, (ICY HOT) 16 % LIQD Apply 1 application topically daily as needed (pain).     pantoprazole (PROTONIX) 40 MG tablet TAKE 1 TABLET (40 MG TOTAL) BY MOUTH DAILY. TAKE 30-60 MIN BEFORE FIRST MEAL OF THE DAY 90 tablet 3   rosuvastatin (CRESTOR) 10 MG tablet TAKE 1 TABLET BY MOUTH EVERY DAY 90 tablet 0   Vitamin D, Ergocalciferol, (DRISDOL) 1.25 MG (50000 UNIT) CAPS capsule TAKE 1 CAPSULE (50,000 UNITS TOTAL) BY MOUTH EVERY MONDAY. 12 capsule 2   terbinafine (LAMISIL) 250 MG tablet Take 1 tablet (250 mg total) by mouth daily. Repeat bloodwork after first 30 days.  Order for labs at [facility]. 30 tablet 2   No facility-administered medications prior to visit.    Review of Systems  Constitutional:  Negative for chills, diaphoresis, fever, malaise/fatigue and  weight loss.  HENT:  Positive for congestion.   Respiratory:  Positive for cough, shortness of breath and wheezing. Negative for hemoptysis and sputum production.   Cardiovascular:  Negative for chest pain, palpitations and leg swelling.     Objective:   Vitals:   03/09/22 0906  BP: 124/70  Pulse: 80  SpO2: 97%  Weight: 165 lb 10.8 oz (75.1 kg)  Height: '5\' 6"'$  (1.676 m)   SpO2: 97 % O2 Device: None (Room air)  Physical Exam: General: Well-appearing, no acute distress HENT: [place], AT Eyes: EOMI, no scleral icterus Respiratory: Clear to auscultation bilaterally.  No crackles, wheezing or  rales Cardiovascular: RRR, -M/R/G, no JVD Extremities:-Edema,-tenderness Neuro: AAO x4, CNII-XII grossly intact Psych: Normal mood, normal affect  Data Reviewed:  Imaging: CXR 02/20/21 - No infiltrate, effusion or edema CT Cardiac 02/15/22 - Visualized lung parenchyma with multiple subcentimeter nodules, largest 53m. Mild atelectasis in the bases. No masses, infiltrate, effusion or pneumothorax.  PFT: None on file  Labs: CBC    Component Value Date/Time   WBC 7.2 02/20/2021 1454   WBC 7.0 11/02/2020 0743   RBC 4.57 02/20/2021 1454   RBC 4.41 11/02/2020 0743   HGB 13.4 02/20/2021 1454   HCT 40.8 02/20/2021 1454   PLT 288 02/20/2021 1454   MCV 89 02/20/2021 1454   MCH 29.3 02/20/2021 1454   MCH 30.4 11/02/2020 0743   MCHC 32.8 02/20/2021 1454   MCHC 33.5 11/02/2020 0743   RDW 13.1 02/20/2021 1454   LYMPHSABS 2.0 02/20/2021 1454   MONOABS 0.5 11/02/2020 0743   EOSABS 0.3 02/20/2021 1454   BASOSABS 0.1 02/20/2021 1454   Absolute eos 02/20/21 - 300     Assessment & Plan:   Discussion: 66year old female never smoker with osteoporosis, HTN, hypothyroidism, hx hepatitis C s/p treatment who presents as a new consult for atelectasis on cardiac CT.   Reviewed imaging with patient and husband. Addressed questions and concerns. Very mild/minimal atelectasis at the lung bases that are not likely contributing to her current symptoms. Reasonable to evaluate with PFTs for her symptoms. If negative would attribute to subacute/chronic bronchitis exacerbated by sinus issues.  Shortness of breath Deconditioning  --ORDER pulmonary function tests  Acute sinusitis  Nasal congestion --Sinus rinses with neti pot --OK to start flonase 1 spray in the morning and evening  Atelectasis --Reviewed CT imaging. Very mild  --Recommended deep breathing exercises  Health Maintenance Immunization History  Administered Date(s) Administered   Fluad Quad(high Dose 65+) 02/20/2021, 01/08/2022    Influenza,inj,Quad PF,6+ Mos 01/23/2018   Influenza,inj,Quad PF,6-35 Mos 12/18/2018   Influenza-Unspecified 01/23/2018, 04/03/2020   Moderna Sars-Covid-2 Vaccination 06/18/2019, 07/17/2019, 04/03/2020   Pneumococcal Conjugate-13 10/19/2019   Tdap 02/25/2019   CT Lung Screen- never smoker. Not qualified  Orders Placed This Encounter  Procedures   Pulmonary function test    Standing Status:   Future    Standing Expiration Date:   03/10/2023    Scheduling Instructions:     Dec 2023    Order Specific Question:   Where should this test be performed?    Answer:   [facility]    Order Specific Question:   Full PFT: includes the following: basic spirometry, spirometry pre & post bronchodilator, diffusion capacity (DLCO), lung volumes    Answer:   Full PFT   Meds ordered this encounter  Medications   azithromycin (ZITHROMAX) 250 MG tablet    Sig: Take two tablets on day 1 and one tablet on  day 2-5.    Dispense:  6 tablet    Refill:  0    No follow-ups on file. After PFTs in December  I have spent a total time of 45-minutes on the day of the appointment reviewing prior documentation, coordinating care and discussing medical diagnosis and plan with the patient/family. Imaging, labs and tests included in this note have been reviewed and interpreted independently by me.  [place], MD [facility] 03/09/2022 9:15 AM  Office Number [phone]

## 2022-03-09 NOTE — Patient Instructions (Signed)
Shortness of breath Deconditioning  --ORDER pulmonary function tests  Acute sinusitis  Nasal congestion --Sinus rinses with neti pot --OK to start flonase 1 spray in the morning and evening  Follow-up with me after PFTs in December

## 2022-03-10 ENCOUNTER — Other Ambulatory Visit: Payer: Self-pay | Admitting: Family Medicine

## 2022-03-22 ENCOUNTER — Ambulatory Visit (INDEPENDENT_AMBULATORY_CARE_PROVIDER_SITE_OTHER): Payer: Medicare Other

## 2022-03-22 VITALS — Ht 66.0 in | Wt 167.0 lb

## 2022-03-22 DIAGNOSIS — Z1231 Encounter for screening mammogram for malignant neoplasm of breast: Secondary | ICD-10-CM

## 2022-03-22 DIAGNOSIS — Z0000 Encounter for general adult medical examination without abnormal findings: Secondary | ICD-10-CM

## 2022-03-22 DIAGNOSIS — Z1211 Encounter for screening for malignant neoplasm of colon: Secondary | ICD-10-CM

## 2022-03-22 NOTE — Patient Instructions (Signed)
Courtney White , Thank you for taking time to come for your Medicare Wellness Visit. I appreciate your ongoing commitment to your health goals. Please review the following plan we discussed and let me know if I can assist you in the future.   These are the goals we discussed:  Goals      DIET - EAT MORE FRUITS AND VEGETABLES     Exercise 150 min/wk Moderate Activity        This is a list of the screening recommended for you and due dates:  Health Maintenance  Topic Date Due   COVID-19 Vaccine (4 - 2023-24 season) 12/22/2021   Zoster (Shingles) Vaccine (1 of 2) 04/09/2022*   Pneumonia Vaccine (2 - PPSV23 or PCV20) 01/09/2023*   Mammogram  01/09/2023*   Colon Cancer Screening  01/09/2023*   Medicare Annual Wellness Visit  03/23/2023   DTaP/Tdap/Td vaccine (2 - Td or Tdap) 02/24/2029   Flu Shot  Completed   DEXA scan (bone density measurement)  Completed   HPV Vaccine  Aged Out  *Topic was postponed. The date shown is not the original due date.    Advanced directives: Please bring a copy of your health care power of attorney and living will to the office to be added to your chart at your convenience.   Conditions/risks identified: Aim for 30 minutes of exercise or brisk walking, 6-8 glasses of water, and 5 servings of fruits and vegetables each day.   Next appointment: Follow up in one year for your annual wellness visit    Preventive Care 65 Years and Older, Female Preventive care refers to lifestyle choices and visits with your health care provider that can promote health and wellness. What does preventive care include? A yearly physical exam. This is also called an annual well check. Dental exams once or twice a year. Routine eye exams. Ask your health care provider how often you should have your eyes checked. Personal lifestyle choices, including: Daily care of your teeth and gums. Regular physical activity. Eating a healthy diet. Avoiding tobacco and drug use. Limiting  alcohol use. Practicing safe sex. Taking low-dose aspirin every day. Taking vitamin and mineral supplements as recommended by your health care provider. What happens during an annual well check? The services and screenings done by your health care provider during your annual well check will depend on your age, overall health, lifestyle risk factors, and family history of disease. Counseling  Your health care provider may ask you questions about your: Alcohol use. Tobacco use. Drug use. Emotional well-being. Home and relationship well-being. Sexual activity. Eating habits. History of falls. Memory and ability to understand (cognition). Work and work Statistician. Reproductive health. Screening  You may have the following tests or measurements: Height, weight, and BMI. Blood pressure. Lipid and cholesterol levels. These may be checked every 5 years, or more frequently if you are over 45 years old. Skin check. Lung cancer screening. You may have this screening every year starting at age 19 if you have a 30-pack-year history of smoking and currently smoke or have quit within the past 15 years. Fecal occult blood test (FOBT) of the stool. You may have this test every year starting at age 15. Flexible sigmoidoscopy or colonoscopy. You may have a sigmoidoscopy every 5 years or a colonoscopy every 10 years starting at age 75. Hepatitis C blood test. Hepatitis B blood test. Sexually transmitted disease (STD) testing. Diabetes screening. This is done by checking your blood sugar (glucose) after you  have not eaten for a while (fasting). You may have this done every 1-3 years. Bone density scan. This is done to screen for osteoporosis. You may have this done starting at age 46. Mammogram. This may be done every 1-2 years. Talk to your health care provider about how often you should have regular mammograms. Talk with your health care provider about your test results, treatment options, and if  necessary, the need for more tests. Vaccines  Your health care provider may recommend certain vaccines, such as: Influenza vaccine. This is recommended every year. Tetanus, diphtheria, and acellular pertussis (Tdap, Td) vaccine. You may need a Td booster every 10 years. Zoster vaccine. You may need this after age 76. Pneumococcal 13-valent conjugate (PCV13) vaccine. One dose is recommended after age 68. Pneumococcal polysaccharide (PPSV23) vaccine. One dose is recommended after age 34. Talk to your health care provider about which screenings and vaccines you need and how often you need them. This information is not intended to replace advice given to you by your health care provider. Make sure you discuss any questions you have with your health care provider. Document Released: 05/06/2015 Document Revised: 12/28/2015 Document Reviewed: 02/08/2015 Elsevier Interactive Patient Education  2017 [place] Prevention in the Home Falls can cause injuries. They can happen to people of all ages. There are many things you can do to make your home safe and to help prevent falls. What can I do on the outside of my home? Regularly fix the edges of walkways and driveways and fix any cracks. Remove anything that might make you trip as you walk through a door, such as a raised step or threshold. Trim any bushes or trees on the path to your home. Use bright outdoor lighting. Clear any walking paths of anything that might make someone trip, such as rocks or tools. Regularly check to see if handrails are loose or broken. Make sure that both sides of any steps have handrails. Any raised decks and porches should have guardrails on the edges. Have any leaves, snow, or ice cleared regularly. Use sand or salt on walking paths during winter. Clean up any spills in your garage right away. This includes oil or grease spills. What can I do in the bathroom? Use night lights. Install grab bars by the toilet  and in the tub and shower. Do not use towel bars as grab bars. Use non-skid mats or decals in the tub or shower. If you need to sit down in the shower, use a plastic, non-slip stool. Keep the floor dry. Clean up any water that spills on the floor as soon as it happens. Remove soap buildup in the tub or shower regularly. Attach bath mats securely with double-sided non-slip rug tape. Do not have throw rugs and other things on the floor that can make you trip. What can I do in the bedroom? Use night lights. Make sure that you have a light by your bed that is easy to reach. Do not use any sheets or blankets that are too big for your bed. They should not hang down onto the floor. Have a firm chair that has side arms. You can use this for support while you get dressed. Do not have throw rugs and other things on the floor that can make you trip. What can I do in the kitchen? Clean up any spills right away. Avoid walking on wet floors. Keep items that you use a lot in easy-to-reach places. If you need  to reach something above you, use a strong step stool that has a grab bar. Keep electrical cords out of the way. Do not use floor polish or wax that makes floors slippery. If you must use wax, use non-skid floor wax. Do not have throw rugs and other things on the floor that can make you trip. What can I do with my stairs? Do not leave any items on the stairs. Make sure that there are handrails on both sides of the stairs and use them. Fix handrails that are broken or loose. Make sure that handrails are as long as the stairways. Check any carpeting to make sure that it is firmly attached to the stairs. Fix any carpet that is loose or worn. Avoid having throw rugs at the top or bottom of the stairs. If you do have throw rugs, attach them to the floor with carpet tape. Make sure that you have a light switch at the top of the stairs and the bottom of the stairs. If you do not have them, ask someone to add  them for you. What else can I do to help prevent falls? Wear shoes that: Do not have high heels. Have rubber bottoms. Are comfortable and fit you well. Are closed at the toe. Do not wear sandals. If you use a stepladder: Make sure that it is fully opened. Do not climb a closed stepladder. Make sure that both sides of the stepladder are locked into place. Ask someone to hold it for you, if possible. Clearly mark and make sure that you can see: Any grab bars or handrails. First and last steps. Where the edge of each step is. Use tools that help you move around (mobility aids) if they are needed. These include: Canes. Walkers. Scooters. Crutches. Turn on the lights when you go into a dark area. Replace any light bulbs as soon as they burn out. Set up your furniture so you have a clear path. Avoid moving your furniture around. If any of your floors are uneven, fix them. If there are any pets around you, be aware of where they are. Review your medicines with your doctor. Some medicines can make you feel dizzy. This can increase your chance of falling. Ask your doctor what other things that you can do to help prevent falls. This information is not intended to replace advice given to you by your health care provider. Make sure you discuss any questions you have with your health care provider. Document Released: 02/03/2009 Document Revised: 09/15/2015 Document Reviewed: 05/14/2014 Elsevier Interactive Patient Education  2017 [employer].

## 2022-03-22 NOTE — Progress Notes (Signed)
Subjective:   Courtney White is a 66 y.o. female who presents for Medicare Annual (Subsequent) preventive examination. I connected with  Courtney White on 03/22/22 by a audio enabled telemedicine application and verified that I am speaking with the correct person using two identifiers.  Patient Location: Home  Provider Location: Home Office  I discussed the limitations of evaluation and management by telemedicine. The patient expressed understanding and agreed to proceed.  Review of Systems     Cardiac Risk Factors include: advanced age (>38mn, >>71women);hypertension     Objective:    Today's Vitals   03/22/22 0817  Weight: 167 lb (75.8 kg)  Height: '5\' 6"'$  (1.676 m)   Body mass index is 26.95 kg/m.     03/22/2022    8:21 AM 12/16/2020    7:34 AM 12/08/2020    3:50 PM 11/02/2020    7:48 AM 10/12/2020    2:09 PM  Advanced Directives  Does Patient Have a Medical Advance Directive? Yes Yes Yes Yes Yes  Type of AParamedicof APurdinLiving will HGageLiving will HSouth ShoreLiving will Living will;Healthcare Power of Attorney Living will;Healthcare Power of Attorney  Does patient want to make changes to medical advance directive?  No - Patient declined  No - Patient declined   Copy of [place] Chart? No - copy requested No - copy requested  No - copy requested No - copy requested  Would patient like information on creating a medical advance directive?  No - Patient declined       Current Medications (verified) Outpatient Encounter Medications as of 03/22/2022  Medication Sig   amLODipine (NORVASC) 5 MG tablet Take 1 tablet (5 mg total) by mouth daily.   Carboxymethylcellul-Glycerin (LUBRICATING EYE DROPS OP) Place 1 drop into both eyes daily as needed (dry eyes).   Ibuprofen 200 MG CAPS Take by mouth.   levothyroxine (SYNTHROID) 112 MCG tablet Take 1 tablet (112 mcg total) by mouth  daily.   Menthol, Topical Analgesic, (ICY HOT) 16 % LIQD Apply 1 application topically daily as needed (pain).   pantoprazole (PROTONIX) 40 MG tablet TAKE 1 TABLET (40 MG TOTAL) BY MOUTH DAILY. TAKE 30-60 MIN BEFORE FIRST MEAL OF THE DAY   rosuvastatin (CRESTOR) 10 MG tablet TAKE 1 TABLET BY MOUTH EVERY DAY   Vitamin D, Ergocalciferol, (DRISDOL) 1.25 MG (50000 UNIT) CAPS capsule TAKE 1 CAPSULE (50,000 UNITS TOTAL) BY MOUTH EVERY MONDAY.   azithromycin (ZITHROMAX) 250 MG tablet Take two tablets on day 1 and one tablet on day 2-5.   No facility-administered encounter medications on file as of 03/22/2022.    Allergies (verified) Patient has no known allergies.   History: Past Medical History:  Diagnosis Date   COVID-19 12/02/2020   Hepatitis C 1992   s/p treatment.  she has cleared virus   HTN (hypertension)    Thyroid disease    Vitiligo    Past Surgical History:  Procedure Laterality Date   THYROIDECTOMY Right 12/16/2020   Procedure: HEMI THYROIDECTOMY;  Surgeon: TLeta Baptist MD;  [place] [facility]  Service: ENT;  Laterality: Right;   TONSILLECTOMY AND ADENOIDECTOMY     UTERINE FIBROID SURGERY     Family History  Problem Relation Age of Onset   Diabetes Mother    Depression Mother    Hypertension Mother    Lung cancer Father    Hypertension Sister    Hypertension Sister  Thyroid disease Maternal Aunt    Social History   Socioeconomic History   Marital status: Significant Other    Spouse name: Not on file   Number of children: 0   Years of education: Not on file   Highest education level: Not on file  Occupational History   Not on file  Tobacco Use   Smoking status: Never   Smokeless tobacco: Never  Vaping Use   Vaping Use: Never used  Substance and Sexual Activity   Alcohol use: Not on file    Comment: occ   Drug use: Not Currently   Sexual activity: Yes    Birth control/protection: Post-menopausal  Other Topics Concern   Not on file   Social History Narrative   Patient is a retired Naval architect.  She is a widower and notes that she moved from the [place] area after being reunited with a high school sweetheart whom she is now engaged to.   She lives locally in [place].   No children.   Social Determinants of Health   Financial Resource Strain: Low Risk  (03/22/2022)   Overall Financial Resource Strain (CARDIA)    Difficulty of Paying Living Expenses: Not hard at all  Food Insecurity: No Food Insecurity (03/22/2022)   Hunger Vital Sign    Worried About Running Out of Food in the Last Year: Never true    Ran Out of Food in the Last Year: Never true  Transportation Needs: No Transportation Needs (03/22/2022)   PRAPARE - Hydrologist (Medical): No    Lack of Transportation (Non-Medical): No  Physical Activity: Insufficiently Active (03/22/2022)   Exercise Vital Sign    Days of Exercise per Week: 3 days    Minutes of Exercise per Session: 40 min  Stress: No Stress Concern Present (03/22/2022)   [place]    Feeling of Stress : Not at all  Social Connections: [place] (03/22/2022)   Social Connection and Isolation Panel [NHANES]    Frequency of Communication with Friends and Family: More than three times a week    Frequency of Social Gatherings with Friends and Family: More than three times a week    Attends Religious Services: More than 4 times per year    Active Member of [employer] or Organizations: Yes    Attends Music therapist: More than 4 times per year    Marital Status: Living with partner    Tobacco Counseling Counseling given: Not Answered   Clinical Intake:  Pre-visit preparation completed: Yes  Pain : No/denies pain     Nutritional Risks: None Diabetes: No  How often do you need to have someone help you when you read instructions, pamphlets, or other written materials  from your doctor or pharmacy?: 1 - Never  Diabetic?no   Interpreter Needed?: No  Information entered by :: Jadene Pierini, LPN   Activities of Daily Living    03/22/2022    8:21 AM 03/20/2022    5:52 PM  In your present state of health, do you have any difficulty performing the following activities:  Hearing? 0 0  Vision? 0 0  Difficulty concentrating or making decisions? 0 0  Walking or climbing stairs? 0 0  Dressing or bathing? 0 0  Doing errands, shopping? 0 0  Preparing Food and eating ? N N  Using the Toilet? N N  In the past six months, have you accidently leaked urine? N  N  Do you have problems with loss of bowel control? N N  Managing your Medications? N N  Managing your Finances? N N  Housekeeping or managing your Housekeeping? N N    Patient Care Team: [name], DO as PCP - General (Family Medicine)  Indicate any recent Medical Services you may have received from other than [facility] providers in the past year (date may be approximate).     Assessment:   This is a routine wellness examination for Courtney White.  Hearing/Vision screen Vision Screening - Comments:: Wears rx glasses - up to date with routine eye exams with  Dr.Johnson   Dietary issues and exercise activities discussed: Current Exercise Habits: Home exercise routine, Type of exercise: walking, Time (Minutes): 40, Frequency (Times/Week): 4, Weekly Exercise (Minutes/Week): 160, Intensity: Mild, Exercise limited by: None identified   Goals Addressed             This Visit's Progress    DIET - EAT MORE FRUITS AND VEGETABLES   On track      Depression Screen    03/22/2022    8:19 AM 01/08/2022   10:09 AM 01/08/2022    9:52 AM 07/07/2021   11:02 AM 12/30/2020    3:28 PM 12/12/2020    1:35 PM 10/12/2020    2:10 PM  PHQ 2/9 Scores  PHQ - 2 Score 0 0 0 0 0 0 0  PHQ- 9 Score    1  2     Fall Risk    03/20/2022    5:52 PM 01/08/2022   10:09 AM 01/08/2022    9:52 AM 07/07/2021   11:03 AM  12/30/2020    3:28 PM  Fall Risk   Falls in the past year? 0 0 0 0 0  Number falls in past yr: 0   0   Injury with Fall? 1   0   Risk for fall due to :    No Fall Risks   Follow up    Falls evaluation completed     [place]:  Any stairs in or around the home? Yes  If so, are there any without handrails? No  Home free of loose throw rugs in walkways, pet beds, electrical cords, etc? Yes  Adequate lighting in your home to reduce risk of falls? Yes   ASSISTIVE DEVICES UTILIZED TO PREVENT FALLS:  Life alert? No  Use of a cane, walker or w/c? No  Grab bars in the bathroom? Yes  Shower chair or bench in shower? Yes  Elevated toilet seat or a handicapped toilet? Yes        03/22/2022    8:21 AM 10/12/2020    2:10 PM  6CIT Screen  What Year? 0 points 0 points  What month? 0 points 0 points  What time? 0 points 0 points  Count back from 20 0 points 0 points  Months in reverse 0 points 0 points  Repeat phrase 0 points 0 points  Total Score 0 points 0 points    Immunizations Immunization History  Administered Date(s) Administered   Fluad Quad(high Dose 65+) 02/20/2021, 01/08/2022   Influenza,inj,Quad PF,6+ Mos 01/23/2018   Influenza,inj,Quad PF,6-35 Mos 12/18/2018   Influenza-Unspecified 01/23/2018, 04/03/2020   Moderna Sars-Covid-2 Vaccination 06/18/2019, 07/17/2019, 04/03/2020   Pneumococcal Conjugate-13 10/19/2019   Tdap 02/25/2019    TDAP status: Up to date  Flu Vaccine status: Up to date  Pneumococcal vaccine status: Up to date  Covid-19 vaccine  status: Completed vaccines  Qualifies for Shingles Vaccine? Yes   Zostavax completed No   Shingrix Completed?: No.    Education has been provided regarding the importance of this vaccine. Patient has been advised to call insurance company to determine out of pocket expense if they have not yet received this vaccine. Advised may also receive vaccine at local pharmacy or Health Dept.  Verbalized acceptance and understanding.  Screening Tests Health Maintenance  Topic Date Due   COVID-19 Vaccine (4 - 2023-24 season) 12/22/2021   Zoster Vaccines- Shingrix (1 of 2) 04/09/2022 (Originally 10/08/1973)   Pneumonia Vaccine 63+ Years old (2 - PPSV23 or PCV20) 01/09/2023 (Originally 10/18/2020)   MAMMOGRAM  01/09/2023 (Originally 12/31/2020)   COLONOSCOPY (Pts 45-58yr Insurance coverage will need to be confirmed)  01/09/2023 (Originally 10/09/1999)   Medicare Annual Wellness (AWV)  03/23/2023   DTaP/Tdap/Td (2 - Td or Tdap) 02/24/2029   INFLUENZA VACCINE  Completed   DEXA SCAN  Completed   HPV VACCINES  Aged Out    Health Maintenance  Health Maintenance Due  Topic Date Due   COVID-19 Vaccine (4 - 2023-24 season) 12/22/2021    Colorectal cancer screening: Referral to GI placed 03/22/2022. Pt aware the office will call re: appt.  Mammogram status: Ordered 03/22/2022. Pt provided with contact info and advised to call to schedule appt.   Bone Density status: Completed 10/21/2020. Results reflect: Bone density results: OSTEOPENIA. Repeat every 5 years.  Lung Cancer Screening: (Low Dose CT Chest recommended if Age 605-80years, 30 pack-year currently smoking OR have quit w/in 15years.) does not qualify.   Lung Cancer Screening Referral: n/a  Additional Screening:  Hepatitis C Screening: does not qualify;   Vision Screening: Recommended annual ophthalmology exams for early detection of glaucoma and other disorders of the eye. Is the patient up to date with their annual eye exam?  Yes  Who is the provider or what is the name of the office in which the patient attends annual eye exams? Dr.Johnson  If pt is not established with a provider, would they like to be referred to a provider to establish care? No .   Dental Screening: Recommended annual dental exams for proper oral hygiene  Community Resource Referral / Chronic Care Management: CRR required this visit?  No   CCM  required this visit?  No      Plan:     I have personally reviewed and noted the following in the patient's chart:   Medical and social history Use of alcohol, tobacco or illicit drugs  Current medications and supplements including opioid prescriptions. Patient is not currently taking opioid prescriptions. Functional ability and status Nutritional status Physical activity Advanced directives List of other physicians Hospitalizations, surgeries, and ER visits in previous 12 months Vitals Screenings to include cognitive, depression, and falls Referrals and appointments  In addition, I have reviewed and discussed with patient certain preventive protocols, quality metrics, and best practice recommendations. A written personalized care plan for preventive services as well as general preventive health recommendations were provided to patient.     LDaphane Shepherd LPN   [id number]  Nurse Notes: Due Colonoscopy and Mammogram

## 2022-04-02 ENCOUNTER — Encounter (HOSPITAL_BASED_OUTPATIENT_CLINIC_OR_DEPARTMENT_OTHER): Payer: Self-pay | Admitting: Pulmonary Disease

## 2022-04-02 ENCOUNTER — Ambulatory Visit (INDEPENDENT_AMBULATORY_CARE_PROVIDER_SITE_OTHER): Payer: Medicare Other | Admitting: Pulmonary Disease

## 2022-04-02 VITALS — BP 132/80 | HR 103 | Ht 65.0 in | Wt 168.2 lb

## 2022-04-02 DIAGNOSIS — R0602 Shortness of breath: Secondary | ICD-10-CM

## 2022-04-02 DIAGNOSIS — R0609 Other forms of dyspnea: Secondary | ICD-10-CM | POA: Diagnosis not present

## 2022-04-02 LAB — PULMONARY FUNCTION TEST
DL/VA % pred: 132 %
DL/VA: 5.47 ml/min/mmHg/L
DLCO cor % pred: 92 %
DLCO cor: 18.81 ml/min/mmHg
DLCO unc % pred: 92 %
DLCO unc: 18.81 ml/min/mmHg
FEF 25-75 Post: 2.33 L/s
FEF 25-75 Pre: 2.18 L/s
FEF2575-%Change-Post: 6 %
FEF2575-%Pred-Post: 111 %
FEF2575-%Pred-Pre: 104 %
FEV1-%Change-Post: 1 %
FEV1-%Pred-Post: 84 %
FEV1-%Pred-Pre: 83 %
FEV1-Post: 2.08 L
FEV1-Pre: 2.06 L
FEV1FVC-%Change-Post: 2 %
FEV1FVC-%Pred-Pre: 106 %
FEV6-%Change-Post: 0 %
FEV6-%Pred-Post: 80 %
FEV6-%Pred-Pre: 81 %
FEV6-Post: 2.5 L
FEV6-Pre: 2.52 L
FEV6FVC-%Change-Post: 0 %
FEV6FVC-%Pred-Post: 104 %
FEV6FVC-%Pred-Pre: 104 %
FVC-%Change-Post: 0 %
FVC-%Pred-Post: 77 %
FVC-%Pred-Pre: 78 %
FVC-Post: 2.5 L
FVC-Pre: 2.52 L
Post FEV1/FVC ratio: 83 %
Post FEV6/FVC ratio: 100 %
Pre FEV1/FVC ratio: 82 %
Pre FEV6/FVC Ratio: 100 %
RV % pred: 87 %
RV: 1.91 L
TLC % pred: 85 %
TLC: 4.44 L

## 2022-04-02 IMAGING — CT CT NECK W/ CM
2 of 4 series · 5 of 14 positions shown, 6 images · IV contrast (iopamidol)
Comparison: Thyroid ultrasound 08/01/2020.

CLINICAL DATA: 64-year-old female with neck swelling, 2.4 cm right
neck nodule on ultrasound in [place] thought to be adjacent to the
thyroid.

EXAM:
CT NECK WITH CONTRAST
TECHNIQUE: Multidetector CT imaging of the neck was performed using the
standard protocol following the bolus administration of intravenous
contrast.
CONTRAST:  75mL Y19ACE-5AA IOPAMIDOL (Y19ACE-5AA) INJECTION 61%

[Series 3: neck · axial · 0.40mm/px · z∈[-264,-170]mm · 2 of 142 slices shown, 3 images]
[im 48/142  soft-tissue]
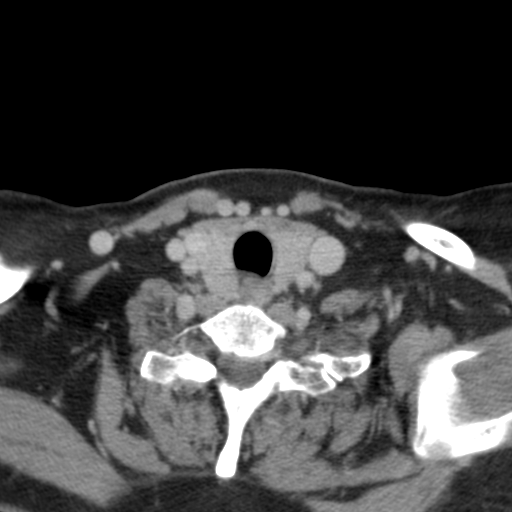
[im 48/142  bone]
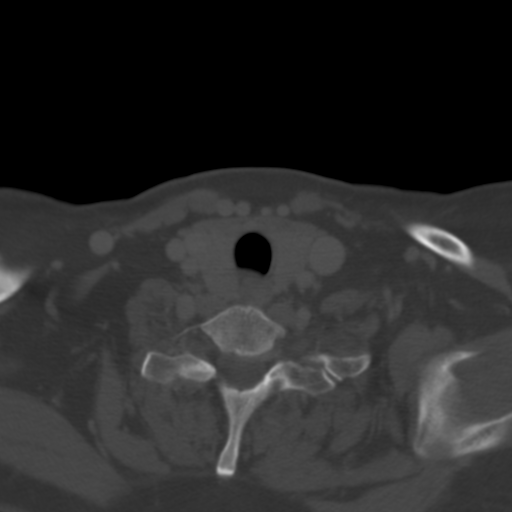
[im 95/142  bone]
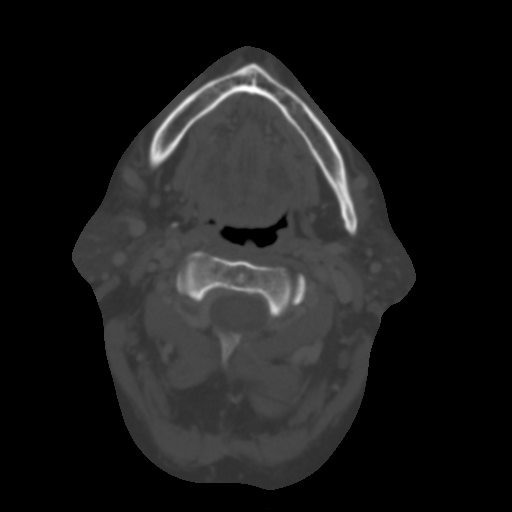

[Series 8: angled axial-oropharynx · axial · 0.39mm/px · z∈[-298,-159]mm · 3 of 141 slices shown]
[im 36/141  bone]
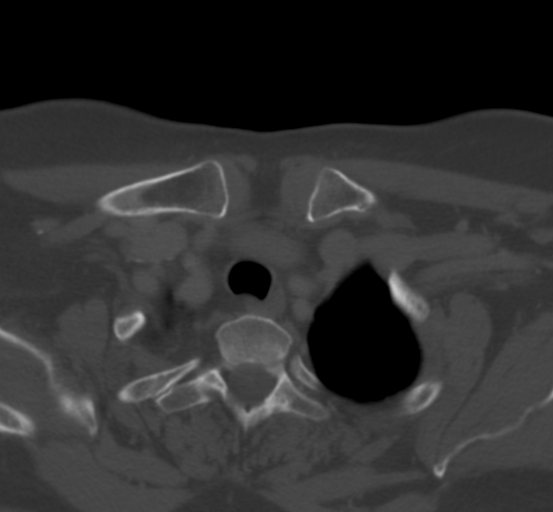
[im 71/141  bone]
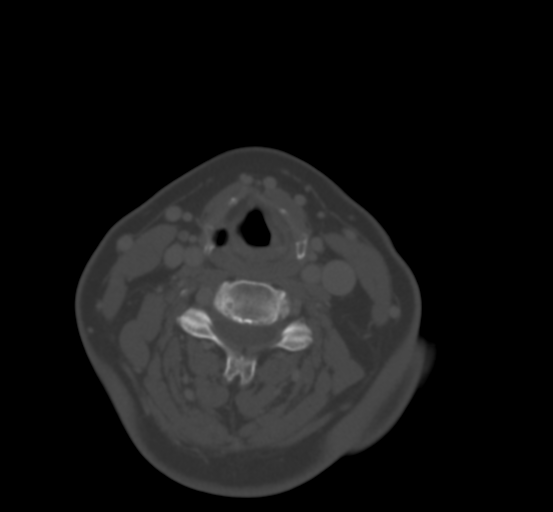
[im 106/141  bone]
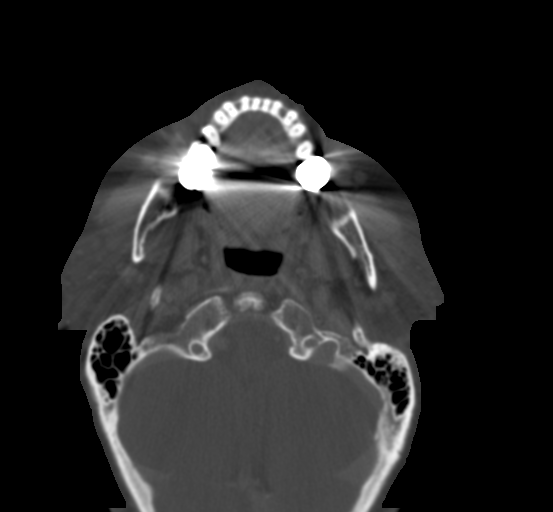

[5 of 14 positions shown; findings below may reference images not displayed]

FINDINGS: Pharynx and larynx: Larynx and pharynx soft tissue contours are
within normal limits. Negative parapharyngeal and retropharyngeal
spaces.

Salivary glands: Negative. Submandibular and parotid glands appear
symmetric and within normal limits.

Thyroid: Subtle heterogeneous enhancement is noted of the right
thyroid lobe (series 3, image 87). The oval soft tissue nodule
abutting the right thyroid lobe and right IJ is redemonstrated on
series 3, image 93 (compare to series 1, image 14 of the prior
thyroid). This demonstrates a small central area of hypoenhancement,
but otherwise fairly homogeneous hyperenhancement and evidence of a
polar vessel along its superior aspect (coronal image 48). This
again measures 2.4 cm on sagittal image 39.

No regional inflammation. No regional lymphadenopathy. Isthmus and
left thyroid lobe appear within normal limits.

Lymph nodes: Negative.  No lymphadenopathy.

Vascular: Major vascular structures in the neck and at the skull
base are patent. Left greater than right ICA origin calcified
plaque. Left IJ is dominant, normal variant. Right vertebral artery
is dominant, normal variant.

Limited intracranial: ICA siphon calcified atherosclerosis.
Otherwise negative.

Visualized orbits: Negative.

Mastoids and visualized paranasal sinuses: Clear.

Skeleton: No acute or suspicious osseous lesion. Mild for age
cervical spine degeneration.

Upper chest: Negative aside from mild calcified atherosclerosis of
the aortic arch. No superior mediastinal or axillary
lymphadenopathy.
IMPRESSION: 1. Stable 2.4 cm oval soft tissue nodule seen by Ultrasound along
the right lateral thyroid margin. Imaging characteristics favor
Parathyroid Adenoma. Query hyperparathyroidism.

2. Otherwise negative Neck CT; calcified carotid atherosclerosis.

## 2022-04-02 NOTE — Progress Notes (Signed)
Full PFT Performed Today  

## 2022-04-02 NOTE — Patient Instructions (Signed)
Full PFT Performed Today  

## 2022-04-02 NOTE — Progress Notes (Signed)
Subjective:   PATIENT ID: Courtney White GENDER: female DOB: Dec 12, 1955, MRN: [id number]  Chief Complaint  Patient presents with   Follow-up    Pft results    Reason for Visit: Follow-up  [name] is a 67 year old female never smoker with osteoporosis, HTN, hypothyroidism, hx hepatitis C s/p treatment who presents as a new consult  Previously followed by [facility] in [place] for cough and shortness of breath. PPI trial was advised. Was lost to follow-up after last visit in 03/2021.  She recently had atelectasis seen on her cardiac CT. She has shortness of breath with activity including walking on the driveway which is inclined. Carrying suitcases one flight is ok but then feeling exhausted after multiple trips. Will have some wheezing. Has associated cough that is fairly controlled triggered by sinus issues. Denies history of chronic respiratory illness.   Has had mucopurulent sinus drainage and sinus pressure for over a week associated low grade fevers.  04/02/22 Since our last visit she has had unchanged shortness of breath. Has been discussing with her husband about increasing activity at home. Has a membership to silver sneakers. Continues to have some wheezing. Cough has improved after antibiotics but has persistent nasal congestion. Has not tried nasal sprays or allergy meds.   Social History: Never smoker  Past Medical History:  Diagnosis Date   COVID-19 12/02/2020   Hepatitis C 1992   s/p treatment.  she has cleared virus   HTN (hypertension)    Thyroid disease    Vitiligo      Family History  Problem Relation Age of Onset   Diabetes Mother    Depression Mother    Hypertension Mother    Lung cancer Father    Hypertension Sister    Hypertension Sister    Thyroid disease Maternal Aunt      Social History   Occupational History   Not on file  Tobacco Use   Smoking status: Never   Smokeless tobacco: Never  Vaping Use   Vaping  Use: Never used  Substance and Sexual Activity   Alcohol use: Not on file    Comment: occ   Drug use: Not Currently   Sexual activity: Yes    Birth control/protection: Post-menopausal    No Known Allergies   Outpatient Medications Prior to Visit  Medication Sig Dispense Refill   amLODipine (NORVASC) 5 MG tablet Take 1 tablet (5 mg total) by mouth daily. 90 tablet 3   Carboxymethylcellul-Glycerin (LUBRICATING EYE DROPS OP) Place 1 drop into both eyes daily as needed (dry eyes).     Ibuprofen 200 MG CAPS Take by mouth.     levothyroxine (SYNTHROID) 112 MCG tablet Take 1 tablet (112 mcg total) by mouth daily. 90 tablet 3   Menthol, Topical Analgesic, (ICY HOT) 16 % LIQD Apply 1 application topically daily as needed (pain).     pantoprazole (PROTONIX) 40 MG tablet TAKE 1 TABLET (40 MG TOTAL) BY MOUTH DAILY. TAKE 30-60 MIN BEFORE FIRST MEAL OF THE DAY 90 tablet 3   rosuvastatin (CRESTOR) 10 MG tablet TAKE 1 TABLET BY MOUTH EVERY DAY 90 tablet 3   Vitamin D, Ergocalciferol, (DRISDOL) 1.25 MG (50000 UNIT) CAPS capsule TAKE 1 CAPSULE (50,000 UNITS TOTAL) BY MOUTH EVERY MONDAY. 12 capsule 2   azithromycin (ZITHROMAX) 250 MG tablet Take two tablets on day 1 and one tablet on day 2-5. 6 tablet 0   No facility-administered medications prior to visit.    Review  of Systems  Constitutional:  Negative for chills, diaphoresis, fever, malaise/fatigue and weight loss.  HENT:  Positive for congestion.   Respiratory:  Positive for cough, shortness of breath and wheezing. Negative for hemoptysis and sputum production.   Cardiovascular:  Negative for chest pain, palpitations and leg swelling.     Objective:   Vitals:   04/02/22 1023  BP: 132/80  Pulse: (!) 103  SpO2: 96%  Weight: 168 lb 3.2 oz (76.3 kg)  Height: '5\' 5"'$  (1.651 m)   SpO2: 96 % O2 Device: None (Room air)  Physical Exam: General: Well-appearing, no acute distress HENT: [place], AT Eyes: EOMI, no scleral icterus Respiratory: No  tachypnea Extremities:-Edema,-tenderness Neuro: AAO x4, CNII-XII grossly intact Psych: Normal mood, normal affect  Data Reviewed:  Imaging: CXR 02/20/21 - No infiltrate, effusion or edema CT Cardiac 02/15/22 - Visualized lung parenchyma with multiple subcentimeter nodules, largest 107m. Mild atelectasis in the bases. No masses, infiltrate, effusion or pneumothorax.  PFT: 04/02/22 FVC 2.5 (77%) FEV1 2.08 (84%) Ratio 82  TLC 85% DLCO 92%. No sig BD response Interpretation: No obstructive or restrictive defect is present. Normal PFTs.    Labs: CBC    Component Value Date/Time   WBC 7.2 02/20/2021 1454   WBC 7.0 11/02/2020 0743   RBC 4.57 02/20/2021 1454   RBC 4.41 11/02/2020 0743   HGB 13.4 02/20/2021 1454   HCT 40.8 02/20/2021 1454   PLT 288 02/20/2021 1454   MCV 89 02/20/2021 1454   MCH 29.3 02/20/2021 1454   MCH 30.4 11/02/2020 0743   MCHC 32.8 02/20/2021 1454   MCHC 33.5 11/02/2020 0743   RDW 13.1 02/20/2021 1454   LYMPHSABS 2.0 02/20/2021 1454   MONOABS 0.5 11/02/2020 0743   EOSABS 0.3 02/20/2021 1454   BASOSABS 0.1 02/20/2021 1454   Absolute eos 02/20/21 - 300     Assessment & Plan:   Discussion: 66year old female never smoker with osteoporosis, HTN, hypothyroidism, hx hepatitis C s/p treatment who presents as a new consult for atelectasis on cardiac CT.   Reviewed PFTs which are normal. Reassured her lungs had nos evidence of asthma or COPD. Her current symptoms recently due to subacute/chronic bronchitis exacerbated by sinus issues. Counseled on exercise and management of nasal congestion.  Shortness of breath Deconditioning  --Congratulations! You have normal pulmonary function! --Discussed regular aerobic exercise and weights 5 days a week for 30 minutes  Nasal congestion --START flonase 1 spray per nostril evening  Atelectasis --Reviewed CT imaging. Very mild  --Recommended deep breathing exercises  Health Maintenance Immunization History   Administered Date(s) Administered   Fluad Quad(high Dose 65+) 02/20/2021, 01/08/2022   Influenza,inj,Quad PF,6+ Mos 01/23/2018   Influenza,inj,Quad PF,6-35 Mos 12/18/2018   Influenza-Unspecified 01/23/2018, 04/03/2020   Moderna Sars-Covid-2 Vaccination 06/18/2019, 07/17/2019, 04/03/2020   Pneumococcal Conjugate-13 10/19/2019   Tdap 02/25/2019   CT Lung Screen- never smoker. Not qualified  No orders of the defined types were placed in this encounter.  No orders of the defined types were placed in this encounter.   Return if symptoms worsen or fail to improve.   I have spent a total time of 32-minutes on the day of the appointment including chart review, data review, collecting history, coordinating care and discussing medical diagnosis and plan with the patient/family. Past medical history, allergies, medications were reviewed. Pertinent imaging, labs and tests included in this note have been reviewed and interpreted independently by me.   Destaney Sarkis JRodman Pickle MD [facility] 04/02/2022 12:20  PM  Office Number [phone]

## 2022-04-02 NOTE — Patient Instructions (Addendum)
Shortness of breath Deconditioning  --Congratulations! You have normal pulmonary function! --Discussed regular aerobic exercise and weights 5 days a week for 30 minutes  Nasal congestion --START flonase 1 spray per nostril evening  Atelectasis --Reviewed CT imaging at last visit. Very mild  --Recommended deep breathing exercises --No further imaging recommended   Follow-up with me as needed if symptoms worsen/persist

## 2022-04-30 ENCOUNTER — Encounter: Payer: Self-pay | Admitting: Cardiology

## 2022-04-30 ENCOUNTER — Ambulatory Visit: Payer: Medicare Other | Admitting: Cardiology

## 2022-04-30 VITALS — BP 153/85 | HR 91 | Resp 16 | Ht 65.0 in | Wt 172.0 lb

## 2022-04-30 DIAGNOSIS — E782 Mixed hyperlipidemia: Secondary | ICD-10-CM

## 2022-04-30 DIAGNOSIS — I10 Essential (primary) hypertension: Secondary | ICD-10-CM

## 2022-04-30 DIAGNOSIS — R0609 Other forms of dyspnea: Secondary | ICD-10-CM

## 2022-04-30 DIAGNOSIS — R072 Precordial pain: Secondary | ICD-10-CM

## 2022-04-30 DIAGNOSIS — R931 Abnormal findings on diagnostic imaging of heart and coronary circulation: Secondary | ICD-10-CM | POA: Insufficient documentation

## 2022-04-30 MED ORDER — NITROGLYCERIN 0.4 MG SL SUBL: 0.4000 mg | SUBLINGUAL_TABLET | SUBLINGUAL | 3 refills | Status: DC | PRN

## 2022-04-30 MED ORDER — METOPROLOL SUCCINATE ER 25 MG PO TB24: 25.0000 mg | ORAL_TABLET | Freq: Every day | ORAL | 3 refills | Status: DC

## 2022-04-30 MED ORDER — ASPIRIN 81 MG PO TBEC: 81.0000 mg | DELAYED_RELEASE_TABLET | Freq: Every day | ORAL | 3 refills | Status: DC

## 2022-04-30 NOTE — Progress Notes (Signed)
Patient referred by [name], DO for chest pain, dyspnea on exertion  Subjective:   Courtney White, female    DOB: 1956/01/03, 67 y.o.   MRN: [id number]   Chief Complaint  Patient presents with   Chest Pain   DOE   New Patient (Initial Visit)     HPI  67 y.o. Caucasian female with hypertension, hyperlipidemia, h/o partial thyroidectomy, parathyroidectomy, h/o passive smoking exposure, elevated coronary calcium score,referred for chest pain, dyspnea on exertion  Patient moved from [place] to [place] around 2020. Her husband passed a few years ago. Fortunately, she then connected with her highschool sweetheart and they have ben together since. Patient has had progressively worsening exertional dyspnea and chest heaviness with minimal activity such as climbing a flight of stairs or two. Blood pressure is elevated. She has had chronic dry cough, predating her thyroid/parathyroid surgery. She was more active over a year or so ago with regular exercise, but has fallen off since then. Her husband (passed in 2020), smoke for several years. Patient has reportedly undergone lung function testing herself, which was normal. She underwent coronary calcium testing in 01/2022, details below.    Past Medical History:  Diagnosis Date   COVID-19 12/02/2020   Hepatitis C 1992   s/p treatment.  she has cleared virus   HTN (hypertension)    Thyroid disease    Vitiligo      Past Surgical History:  Procedure Laterality Date   THYROIDECTOMY Right 12/16/2020   Procedure: HEMI THYROIDECTOMY;  Surgeon: Leta Baptist, MD;  Location: [facility];  Service: ENT;  Laterality: Right;   TONSILLECTOMY AND ADENOIDECTOMY     UTERINE FIBROID SURGERY       Social History   Tobacco Use  Smoking Status Never  Smokeless Tobacco Never    Social History   Substance and Sexual Activity  Alcohol Use None   Comment: occ     Family History  Problem Relation Age  of Onset   Diabetes Mother    Depression Mother    Hypertension Mother    Lung cancer Father    Hypertension Sister    Hypertension Sister    Thyroid disease Maternal Aunt       Current Outpatient Medications:    acetaminophen (TYLENOL) 325 MG tablet, Take 650 mg by mouth every 6 (six) hours as needed., Disp: , Rfl:    amLODipine (NORVASC) 5 MG tablet, Take 1 tablet (5 mg total) by mouth daily., Disp: 90 tablet, Rfl: 3   Carboxymethylcellul-Glycerin (LUBRICATING EYE DROPS OP), Place 1 drop into both eyes daily as needed (dry eyes)., Disp: , Rfl:    fluticasone (FLONASE) 50 MCG/ACT nasal spray, Place into both nostrils daily., Disp: , Rfl:    Ibuprofen 200 MG CAPS, Take by mouth., Disp: , Rfl:    levothyroxine (SYNTHROID) 112 MCG tablet, Take 1 tablet (112 mcg total) by mouth daily., Disp: 90 tablet, Rfl: 3   Menthol, Topical Analgesic, (ICY HOT) 16 % LIQD, Apply 1 application topically daily as needed (pain)., Disp: , Rfl:    pantoprazole (PROTONIX) 40 MG tablet, TAKE 1 TABLET (40 MG TOTAL) BY MOUTH DAILY. TAKE 30-60 MIN BEFORE FIRST MEAL OF THE DAY, Disp: 90 tablet, Rfl: 3   rosuvastatin (CRESTOR) 10 MG tablet, TAKE 1 TABLET BY MOUTH EVERY DAY, Disp: 90 tablet, Rfl: 3   Vitamin D, Ergocalciferol, (DRISDOL) 1.25 MG (50000 UNIT) CAPS capsule, TAKE 1 CAPSULE (50,000 UNITS TOTAL) BY MOUTH EVERY  MONDAY., Disp: 12 capsule, Rfl: 2   Cardiovascular and other pertinent studies:  Reviewed external labs and tests, independently interpreted  EKG 04/30/2022: Sinus rhythm 86 bpm  Left atrial enlargement  CT cardiac scoring 02/15/2022: LM: 0 LAD: 386 LCx: 4.27 RCA: 5.34   Total Agatston Score: 386 MESA database percentile: 93  Cholelithiasis. Aortic atherosclerosis. Multiple pulmonary nodules. Most significant: 3 mm solid pulmonary nodule.Per Fleischner Society Guidelines, no routine follow-up imaging is recommended.These guidelines do not apply to immunocompromised patients and  patients with cancer. Follow up in patients with significant comorbidities as clinically warranted. For lung cancer screening, adhere to Lung-RADS guidelines. Reference: Radiology. 2017; 284(1):228-43        Recent labs: March-Oct 2023: Glucose 104, BUN/Cr 13/0.65. EGFR 96. Na/K 141/4.5. Rest of the CMP normal H/H 13/40. MCV 89. Platelets 288 HbA1C NA Chol 143, TG 82, HDL 56, LDL 71 TSH 1.0 normal, Free T4 2.1 high   Review of Systems  Constitutional: Positive for malaise/fatigue.  Cardiovascular:  Positive for chest pain and dyspnea on exertion. Negative for leg swelling, palpitations and syncope.         Vitals:   04/30/22 0905  BP: (!) 153/85  Pulse: 91  Resp: 16  SpO2: 97%     Body mass index is 28.62 kg/m. Filed Weights   04/30/22 0905  Weight: 172 lb (78 kg)     Objective:   Physical Exam Vitals and nursing note reviewed.  Constitutional:      General: She is not in acute distress. Neck:     Vascular: No JVD.  Cardiovascular:     Rate and Rhythm: Normal rate and regular rhythm.     Heart sounds: Normal heart sounds. No murmur heard. Pulmonary:     Effort: Pulmonary effort is normal.     Breath sounds: Normal breath sounds. No wheezing or rales.         Visit diagnoses:   ICD-10-CM   1. Precordial pain  R07.2 EKG 12-Lead    aspirin EC 81 MG tablet    PCV MYOCARDIAL PERFUSION WO LEXISCAN    PCV ECHOCARDIOGRAM COMPLETE    metoprolol succinate (TOPROL-XL) 25 MG 24 hr tablet    nitroGLYCERIN (NITROSTAT) 0.4 MG SL tablet    2. Dyspnea on exertion  R06.09 aspirin EC 81 MG tablet    PCV MYOCARDIAL PERFUSION WO LEXISCAN    PCV ECHOCARDIOGRAM COMPLETE    metoprolol succinate (TOPROL-XL) 25 MG 24 hr tablet    nitroGLYCERIN (NITROSTAT) 0.4 MG SL tablet    3. Agatston coronary artery calcium score between 100 and 400  R93.1     4. Essential hypertension  I10     5. Mixed hyperlipidemia  E78.2        Orders Placed This Encounter   Procedures   PCV MYOCARDIAL PERFUSION WO LEXISCAN   EKG 12-Lead   PCV ECHOCARDIOGRAM COMPLETE     Meds ordered this encounter  Medications   aspirin EC 81 MG tablet    Sig: Take 1 tablet (81 mg total) by mouth daily. Swallow whole.    Dispense:  90 tablet    Refill:  3   metoprolol succinate (TOPROL-XL) 25 MG 24 hr tablet    Sig: Take 1 tablet (25 mg total) by mouth daily. Take with or immediately following a meal.    Dispense:  30 tablet    Refill:  3   nitroGLYCERIN (NITROSTAT) 0.4 MG SL tablet    Sig: Place 1 tablet (0.4 mg  total) under the tongue every 5 (five) minutes as needed for chest pain.    Dispense:  30 tablet    Refill:  3     Assessment & Recommendations:    67 y.o. Caucasian female with hypertension, hyperlipidemia, elevated coronary calcium score,referred for chest pain, dyspnea on exertion  Chest pain, dyspnea on exertion: Concerning for angina. High calcium score (01/2022). Recommend Aspirin 81 mg daily, metoprolol succinate 25 mg daily, prn nitro. Recommend echocardiogram, exercise nuclear stress testing. Hold metoprolol succinate on the morning of stress testing.  Discussed the use of SL NTG and need to call 911 if symptoms do not improve after rest and 2 SL NTG.   Hypertension: Continue amlodipine. Added metoprolol succinate.   Mixed hyperlipidemia: Chol 143, TG 82, HDL 56, LDL 71 (2023) Continue Crestor 10 mg.  Thank you for referring the patient to [place]. Please feel free to contact with any questions.   Nigel Mormon, MD Pager: [phone] Office: [phone]

## 2022-05-03 ENCOUNTER — Ambulatory Visit: Payer: Medicare Other

## 2022-05-03 DIAGNOSIS — R0609 Other forms of dyspnea: Secondary | ICD-10-CM

## 2022-05-03 DIAGNOSIS — R072 Precordial pain: Secondary | ICD-10-CM

## 2022-05-14 ENCOUNTER — Ambulatory Visit: Payer: Medicare Other

## 2022-05-14 DIAGNOSIS — R0609 Other forms of dyspnea: Secondary | ICD-10-CM

## 2022-05-14 DIAGNOSIS — R072 Precordial pain: Secondary | ICD-10-CM

## 2022-05-31 ENCOUNTER — Encounter: Payer: Self-pay | Admitting: Family Medicine

## 2022-06-13 ENCOUNTER — Ambulatory Visit: Payer: Medicare Other | Admitting: Cardiology

## 2022-06-14 ENCOUNTER — Encounter: Payer: Self-pay | Admitting: Cardiology

## 2022-06-14 ENCOUNTER — Ambulatory Visit: Payer: Medicare Other | Admitting: Cardiology

## 2022-06-14 VITALS — BP 159/83 | HR 78 | Ht 65.0 in | Wt 175.8 lb

## 2022-06-14 DIAGNOSIS — R072 Precordial pain: Secondary | ICD-10-CM

## 2022-06-14 DIAGNOSIS — R931 Abnormal findings on diagnostic imaging of heart and coronary circulation: Secondary | ICD-10-CM

## 2022-06-14 DIAGNOSIS — R9439 Abnormal result of other cardiovascular function study: Secondary | ICD-10-CM

## 2022-06-14 DIAGNOSIS — R0609 Other forms of dyspnea: Secondary | ICD-10-CM

## 2022-06-14 DIAGNOSIS — I10 Essential (primary) hypertension: Secondary | ICD-10-CM

## 2022-06-14 MED ORDER — ASPIRIN 81 MG PO TBEC: 81.0000 mg | DELAYED_RELEASE_TABLET | Freq: Every day | ORAL | 3 refills | Status: DC

## 2022-06-14 MED ORDER — AMLODIPINE BESYLATE 10 MG PO TABS: 10.0000 mg | ORAL_TABLET | Freq: Every day | ORAL | 2 refills | Status: DC

## 2022-06-14 MED ORDER — METOPROLOL SUCCINATE ER 50 MG PO TB24: 50.0000 mg | ORAL_TABLET | Freq: Every day | ORAL | 3 refills | Status: DC

## 2022-06-14 NOTE — Progress Notes (Signed)
Patient referred by Courtney Norlander, DO for chest pain, dyspnea on exertion  Subjective:   Courtney White, female    DOB: [birth year], 67 y.o.   MRN: [id number]   Chief Complaint  Patient presents with   Precordial pain   Follow-up     HPI  67 y.o. Caucasian female with hypertension, hyperlipidemia, h/o partial thyroidectomy, parathyroidectomy, h/o passive smoking exposure, elevated coronary calcium score,referred for chest pain, dyspnea on exertion  Since starting medical therapy, symptoms of chest pain gave improved, but not resolved. She is also not doing heavy physical activity.  Reviewed recent test results with the patient, details below.    Initial consultation visit 04/2022: Patient moved from [place] to [place] around 2020. Her husband passed a few years ago. Fortunately, she then connected with her highschool sweetheart and they have ben together since. Patient has had progressively worsening exertional dyspnea and chest heaviness with minimal activity such as climbing a flight of stairs or two. Blood pressure is elevated. She has had chronic dry cough, predating her thyroid/parathyroid surgery. She was more active over a year or so ago with regular exercise, but has fallen off since then. Her husband (passed in 2020), smoke for several years. Patient has reportedly undergone lung function testing herself, which was normal. She underwent coronary calcium testing in 01/2022, details below.     Current Outpatient Medications:    acetaminophen (TYLENOL) 325 MG tablet, Take 650 mg by mouth every 6 (six) hours as needed., Disp: , Rfl:    amLODipine (NORVASC) 5 MG tablet, Take 1 tablet (5 mg total) by mouth daily., Disp: 90 tablet, Rfl: 3   aspirin EC 81 MG tablet, Take 1 tablet (81 mg total) by mouth daily. Swallow whole., Disp: 90 tablet, Rfl: 3   Carboxymethylcellul-Glycerin (LUBRICATING EYE DROPS OP), Place 1 drop into both eyes daily as needed (dry  eyes)., Disp: , Rfl:    fluticasone (FLONASE) 50 MCG/ACT nasal spray, Place into both nostrils daily., Disp: , Rfl:    Ibuprofen 200 MG CAPS, Take by mouth., Disp: , Rfl:    levothyroxine (SYNTHROID) 112 MCG tablet, Take 1 tablet (112 mcg total) by mouth daily., Disp: 90 tablet, Rfl: 3   Menthol, Topical Analgesic, (ICY HOT) 16 % LIQD, Apply 1 application topically daily as needed (pain)., Disp: , Rfl:    metoprolol succinate (TOPROL-XL) 25 MG 24 hr tablet, Take 1 tablet (25 mg total) by mouth daily. Take with or immediately following a meal., Disp: 30 tablet, Rfl: 3   nitroGLYCERIN (NITROSTAT) 0.4 MG SL tablet, Place 1 tablet (0.4 mg total) under the tongue every 5 (five) minutes as needed for chest pain., Disp: 30 tablet, Rfl: 3   pantoprazole (PROTONIX) 40 MG tablet, TAKE 1 TABLET (40 MG TOTAL) BY MOUTH DAILY. TAKE 30-60 MIN BEFORE FIRST MEAL OF THE DAY, Disp: 90 tablet, Rfl: 3   rosuvastatin (CRESTOR) 10 MG tablet, TAKE 1 TABLET BY MOUTH EVERY DAY, Disp: 90 tablet, Rfl: 3   Vitamin D, Ergocalciferol, (DRISDOL) 1.25 MG (50000 UNIT) CAPS capsule, TAKE 1 CAPSULE (50,000 UNITS TOTAL) BY MOUTH EVERY MONDAY., Disp: 12 capsule, Rfl: 2   Cardiovascular and other pertinent studies:  Reviewed external labs and tests, independently interpreted  Exercise nuclear stress test 05/14/2022: Myocardial perfusion is normal. TID is positive at 1.27 which could be due to hypertensive response, however, cannot definitively rule out balanced ischemia. Overall LV systolic function is normal without regional wall motion abnormalities. Stress LV EF:  83%.  Normal ECG stress. The patient exercised for 5 minutes and 36 seconds of a Bruce protocol, achieving approximately 7.05 METs and 86% MPHR. The heart rate response was normal. The blood pressure response was normal.   Echocardiogram 05/03/2022:  Left ventricle cavity is normal in size. Mild concentric hypertrophy of  the left ventricle. Normal global wall motion.  Doppler evidence of grade I  (impaired) diastolic dysfunction, elevated LAP. Normal LV systolic  function with visual EF 60-65%. Calculated EF 73%.  Left atrial cavity is mildly dilated.  Mild (Grade I) mitral regurgitation. Mild calcification of the mitral  valve annulus. Mild mitral valve leaflet calcification.  Structurally normal tricuspid valve.  Mild tricuspid regurgitation. No  evidence of pulmonary hypertension. RVSP measures 31 mmHg.  Structurally normal pulmonic valve.  Mild pulmonic regurgitation.   EKG 04/30/2022: Sinus rhythm 86 bpm  Left atrial enlargement  CT cardiac scoring 02/15/2022: LM: 0 LAD: 386 LCx: 4.27 RCA: 5.34   Total Agatston Score: 386 MESA database percentile: 93  Cholelithiasis. Aortic atherosclerosis. Multiple pulmonary nodules. Most significant: 3 mm solid pulmonary nodule.Per Fleischner Society Guidelines, no routine follow-up imaging is recommended.These guidelines do not apply to immunocompromised patients and patients with cancer. Follow up in patients with significant comorbidities as clinically warranted. For lung cancer screening, adhere to Lung-RADS guidelines. Reference: Radiology. 2017; 284(1):228-43        Recent labs: March-Oct 2023: Glucose 104, BUN/Cr 13/0.65. EGFR 96. Na/K 141/4.5. Rest of the CMP normal H/H 13/40. MCV 89. Platelets 288 HbA1C NA Chol 143, TG 82, HDL 56, LDL 71 TSH 1.0 normal, Free T4 2.1 high   Review of Systems  Constitutional: Positive for malaise/fatigue.  Cardiovascular:  Positive for chest pain and dyspnea on exertion. Negative for leg swelling, palpitations and syncope.         Vitals:   06/14/22 0836  BP: (!) 159/83  Pulse: 78  SpO2: 97%     Body mass index is 29.25 kg/m. Filed Weights   06/14/22 0836  Weight: 175 lb 12.8 oz (79.7 kg)     Objective:   Physical Exam Vitals and nursing note reviewed.  Constitutional:      General: She is not in acute distress. Neck:      Vascular: No JVD.  Cardiovascular:     Rate and Rhythm: Normal rate and regular rhythm.     Heart sounds: Normal heart sounds. No murmur heard. Pulmonary:     Effort: Pulmonary effort is normal.     Breath sounds: Normal breath sounds. No wheezing or rales.         Visit diagnoses:   ICD-10-CM   1. Precordial pain  R07.2 aspirin EC 81 MG tablet    metoprolol succinate (TOPROL-XL) 50 MG 24 hr tablet    2. Dyspnea on exertion  R06.09 aspirin EC 81 MG tablet    metoprolol succinate (TOPROL-XL) 50 MG 24 hr tablet    3. Essential hypertension  I10 amLODipine (NORVASC) 10 MG tablet       Meds ordered this encounter  Medications   aspirin EC 81 MG tablet    Sig: Take 1 tablet (81 mg total) by mouth daily. Swallow whole.    Dispense:  90 tablet    Refill:  3   metoprolol succinate (TOPROL-XL) 50 MG 24 hr tablet    Sig: Take 1 tablet (50 mg total) by mouth daily. Take with or immediately following a meal.    Dispense:  30 tablet    Refill:  3   amLODipine (NORVASC) 10 MG tablet    Sig: Take 1 tablet (10 mg total) by mouth daily.    Dispense:  30 tablet    Refill:  2     Assessment & Recommendations:    67 y.o. Caucasian female with hypertension, hyperlipidemia, elevated coronary calcium score,referred for chest pain, dyspnea on exertion  Chest pain, dyspnea on exertion: Concerning for angina. High calcium score (01/2022). Stress test with no ischemia, but TID 1.27, concerning for balanced ischemia s/o multivessel CAD.  Discussed conservative medical therapy alone vs coronary revascularization, if necessary. Patient's significant other has a procedure tomorrow. At this time, patient would like to further optimize medical therapy, and think about the procedure., Increase metoprolol succinate to 50 mg daily, amlodipine to 10 mg daily.  Continue Aspirin 81 mg daily, rosuvastatin 10 mg daily, prn nitro.  Hypertension: Continue amlodipine. Added metoprolol succinate.   Mixed  hyperlipidemia: Chol 143, TG 82, HDL 56, LDL 71 (2023) Continue Crestor 10 mg.  F/u in 4 weeks    Nigel Mormon, MD Pager: [phone] Office: [phone]

## 2022-06-18 ENCOUNTER — Other Ambulatory Visit: Payer: Self-pay | Admitting: Cardiology

## 2022-06-18 DIAGNOSIS — R072 Precordial pain: Secondary | ICD-10-CM

## 2022-06-22 LAB — CBC
Hematocrit: 42 % (ref 34.0–46.6)
Hemoglobin: 13.6 g/dL (ref 11.1–15.9)
MCH: 29.1 pg (ref 26.6–33.0)
MCHC: 32.4 g/dL (ref 31.5–35.7)
MCV: 90 fL (ref 79–97)
Platelets: 271 10*3/uL (ref 150–450)
RBC: 4.67 x10E6/uL (ref 3.77–5.28)
RDW: 12.8 % (ref 11.7–15.4)
WBC: 7.5 10*3/uL (ref 3.4–10.8)

## 2022-06-22 LAB — BASIC METABOLIC PANEL
BUN/Creatinine Ratio: 21 (ref 12–28)
BUN: 13 mg/dL (ref 8–27)
CO2: 22 mmol/L (ref 20–29)
Calcium: 10.3 mg/dL (ref 8.7–10.3)
Chloride: 104 mmol/L (ref 96–106)
Creatinine, Ser: 0.63 mg/dL (ref 0.57–1.00)
Glucose: 76 mg/dL (ref 70–99)
Potassium: 4.4 mmol/L (ref 3.5–5.2)
Sodium: 142 mmol/L (ref 134–144)
eGFR: 97 mL/min/{1.73_m2} (ref >59)

## 2022-06-26 ENCOUNTER — Other Ambulatory Visit: Payer: Self-pay

## 2022-06-26 ENCOUNTER — Ambulatory Visit (HOSPITAL_COMMUNITY)
Admission: RE | Disposition: A | Discharge status: Home / Self Care | Payer: Self-pay | Source: Home / Self Care | Attending: Cardiology

## 2022-06-26 ENCOUNTER — Ambulatory Visit (HOSPITAL_COMMUNITY)
Admission: RE | Admit: 2022-06-26 | Discharge: 2022-06-26 | Disposition: A | Discharge status: Home / Self Care | Payer: Medicare Other | Attending: Cardiology | Admitting: Cardiology

## 2022-06-26 DIAGNOSIS — I25118 Atherosclerotic heart disease of native coronary artery with other forms of angina pectoris: Secondary | ICD-10-CM | POA: Insufficient documentation

## 2022-06-26 DIAGNOSIS — Z7982 Long term (current) use of aspirin: Secondary | ICD-10-CM | POA: Diagnosis not present

## 2022-06-26 DIAGNOSIS — R0609 Other forms of dyspnea: Secondary | ICD-10-CM | POA: Insufficient documentation

## 2022-06-26 DIAGNOSIS — I2510 Atherosclerotic heart disease of native coronary artery without angina pectoris: Secondary | ICD-10-CM

## 2022-06-26 DIAGNOSIS — Z79899 Other long term (current) drug therapy: Secondary | ICD-10-CM | POA: Insufficient documentation

## 2022-06-26 DIAGNOSIS — E782 Mixed hyperlipidemia: Secondary | ICD-10-CM | POA: Insufficient documentation

## 2022-06-26 DIAGNOSIS — R072 Precordial pain: Secondary | ICD-10-CM

## 2022-06-26 DIAGNOSIS — I10 Essential (primary) hypertension: Secondary | ICD-10-CM | POA: Insufficient documentation

## 2022-06-26 HISTORY — PX: INTRAVASCULAR PRESSURE WIRE/FFR STUDY: CATH118243

## 2022-06-26 HISTORY — PX: LEFT HEART CATH AND CORONARY ANGIOGRAPHY: CATH118249

## 2022-06-26 LAB — POCT ACTIVATED CLOTTING TIME: Activated Clotting Time: 228 seconds

## 2022-06-26 SURGERY — LEFT HEART CATH AND CORONARY ANGIOGRAPHY
Anesthesia: LOCAL

## 2022-06-26 MED ORDER — SODIUM CHLORIDE 0.9 % IV SOLN: 250.0000 mL | INTRAVENOUS | Status: DC | PRN

## 2022-06-26 MED ORDER — HEPARIN SODIUM (PORCINE) 1000 UNIT/ML IJ SOLN
INTRAMUSCULAR | Status: DC | PRN
  Administered 2022-06-26: 4000 [IU] via INTRAVENOUS
  Administered 2022-06-26 (×3): 2000 [IU] via INTRAVENOUS

## 2022-06-26 MED ORDER — LIDOCAINE HCL (PF) 1 % IJ SOLN
INTRAMUSCULAR | Status: DC | PRN
  Administered 2022-06-26: 2 mL

## 2022-06-26 MED ORDER — ASPIRIN 81 MG PO CHEW: 81.0000 mg | CHEWABLE_TABLET | ORAL | Status: DC

## 2022-06-26 MED ORDER — SODIUM CHLORIDE 0.9 % WEIGHT BASED INFUSION
1.0000 mL/kg/h | INTRAVENOUS | Status: DC
  Administered 2022-06-26: 750 mL via INTRAVENOUS

## 2022-06-26 MED ORDER — ASPIRIN 81 MG PO CHEW
81.0000 mg | CHEWABLE_TABLET | ORAL | Status: AC
  Administered 2022-06-26: 81 mg via ORAL
  Filled 2022-06-26: qty 1

## 2022-06-26 MED ORDER — IOHEXOL 350 MG/ML SOLN
INTRAVENOUS | Status: DC | PRN
  Administered 2022-06-26: 60 mL via INTRA_ARTERIAL

## 2022-06-26 MED ORDER — HEPARIN (PORCINE) IN NACL 1000-0.9 UT/500ML-% IV SOLN
INTRAVENOUS | Status: DC | PRN
  Administered 2022-06-26 (×3): 500 mL

## 2022-06-26 MED ORDER — SODIUM CHLORIDE 0.9 % WEIGHT BASED INFUSION
3.0000 mL/kg/h | INTRAVENOUS | Status: AC
  Administered 2022-06-26: 3 mL/kg/h via INTRAVENOUS

## 2022-06-26 MED ORDER — ADENOSINE (DIAGNOSTIC) 140MCG/KG/MIN
INTRAVENOUS | Status: DC | PRN
  Administered 2022-06-26: 140 ug/kg/min via INTRAVENOUS

## 2022-06-26 MED ORDER — VERAPAMIL HCL 2.5 MG/ML IV SOLN
INTRAVENOUS | Status: AC
  Filled 2022-06-26: qty 2

## 2022-06-26 MED ORDER — ACETAMINOPHEN 325 MG PO TABS
650.0000 mg | ORAL_TABLET | ORAL | Status: DC | PRN
  Administered 2022-06-26: 650 mg via ORAL
  Filled 2022-06-26: qty 2

## 2022-06-26 MED ORDER — HEPARIN SODIUM (PORCINE) 1000 UNIT/ML IJ SOLN
INTRAMUSCULAR | Status: AC
  Filled 2022-06-26: qty 10

## 2022-06-26 MED ORDER — SODIUM CHLORIDE 0.9 % IV SOLN: INTRAVENOUS | Status: AC

## 2022-06-26 MED ORDER — METOPROLOL SUCCINATE ER 25 MG PO TB24: 25.0000 mg | ORAL_TABLET | Freq: Every day | ORAL | 2 refills | Status: DC

## 2022-06-26 MED ORDER — LIDOCAINE HCL (PF) 1 % IJ SOLN
INTRAMUSCULAR | Status: AC
  Filled 2022-06-26: qty 30

## 2022-06-26 MED ORDER — AMLODIPINE BESYLATE 5 MG PO TABS: 5.0000 mg | ORAL_TABLET | Freq: Every day | ORAL | 3 refills | Status: DC

## 2022-06-26 MED ORDER — SODIUM CHLORIDE 0.9% FLUSH: 3.0000 mL | Freq: Two times a day (BID) | INTRAVENOUS | Status: DC

## 2022-06-26 MED ORDER — ONDANSETRON HCL 4 MG/2ML IJ SOLN: 4.0000 mg | Freq: Four times a day (QID) | INTRAMUSCULAR | Status: DC | PRN

## 2022-06-26 MED ORDER — NITROGLYCERIN 1 MG/10 ML FOR IR/CATH LAB
INTRA_ARTERIAL | Status: DC | PRN
  Administered 2022-06-26: 150 ug via INTRACORONARY
  Administered 2022-06-26: 300 ug via INTRA_ARTERIAL
  Administered 2022-06-26: 150 ug via INTRA_ARTERIAL

## 2022-06-26 MED ORDER — MIDAZOLAM HCL 2 MG/2ML IJ SOLN
INTRAMUSCULAR | Status: DC | PRN
  Administered 2022-06-26 (×2): 1 mg via INTRAVENOUS

## 2022-06-26 MED ORDER — FENTANYL CITRATE (PF) 100 MCG/2ML IJ SOLN
INTRAMUSCULAR | Status: AC
  Filled 2022-06-26: qty 2

## 2022-06-26 MED ORDER — ADENOSINE 12 MG/4ML IV SOLN
INTRAVENOUS | Status: AC
  Filled 2022-06-26: qty 16

## 2022-06-26 MED ORDER — FENTANYL CITRATE (PF) 100 MCG/2ML IJ SOLN
INTRAMUSCULAR | Status: DC | PRN
  Administered 2022-06-26 (×2): 25 ug via INTRAVENOUS

## 2022-06-26 MED ORDER — SODIUM CHLORIDE 0.9% FLUSH: 3.0000 mL | INTRAVENOUS | Status: DC | PRN

## 2022-06-26 MED ORDER — NITROGLYCERIN 1 MG/10 ML FOR IR/CATH LAB
INTRA_ARTERIAL | Status: AC
  Filled 2022-06-26: qty 10

## 2022-06-26 MED ORDER — VERAPAMIL HCL 2.5 MG/ML IV SOLN: INTRAVENOUS | Status: DC | PRN

## 2022-06-26 MED ORDER — HYDRALAZINE HCL 20 MG/ML IJ SOLN: 10.0000 mg | INTRAMUSCULAR | Status: DC | PRN

## 2022-06-26 MED ORDER — LABETALOL HCL 5 MG/ML IV SOLN: 10.0000 mg | INTRAVENOUS | Status: DC | PRN

## 2022-06-26 MED ORDER — MIDAZOLAM HCL 2 MG/2ML IJ SOLN
INTRAMUSCULAR | Status: AC
  Filled 2022-06-26: qty 2

## 2022-06-26 SURGICAL SUPPLY — 17 items
BAND CMPR LRG ZPHR (HEMOSTASIS) ×1
BAND ZEPHYR COMPRESS 30 LONG (HEMOSTASIS) IMPLANT
CATH INFINITI 5 FR JL3.5 (CATHETERS) IMPLANT
CATH LAUNCHER 6FR EBU 3 (CATHETERS) IMPLANT
CATH OPTITORQUE TIG 4.0 5F (CATHETERS) IMPLANT
CATH VISTA GUIDE 6FR XBLAD3.5 (CATHETERS) IMPLANT
GLIDESHEATH SLEND A-KIT 6F 22G (SHEATH) IMPLANT
GUIDEWIRE INQWIRE 1.5J.035X260 (WIRE) IMPLANT
GUIDEWIRE PRESSURE X 175 (WIRE) IMPLANT
INQWIRE 1.5J .035X260CM (WIRE) ×1
KIT HEART LEFT (KITS) ×1 IMPLANT
PACK CARDIAC CATHETERIZATION (CUSTOM PROCEDURE TRAY) ×1 IMPLANT
SHEATH 6FR 75 DEST SLENDER (SHEATH) IMPLANT
TRANSDUCER W/STOPCOCK (MISCELLANEOUS) ×1 IMPLANT
TUBING CIL FLEX 10 FLL-RA (TUBING) ×1 IMPLANT
VALVE GUARDIAN II ~~LOC~~ HEMO (MISCELLANEOUS) IMPLANT
WIRE HI TORQ VERSACORE-J 145CM (WIRE) IMPLANT

## 2022-06-26 NOTE — H&P (Signed)
OV 06/14/2022 copied for documentation     Patient referred by [name], DO for chest pain, dyspnea on exertion  Subjective:   Courtney White, female    DOB: 1955/09/18, 67 y.o.   MRN: [id number]   Chief Complaint  Patient presents with   Precordial pain   Follow-up     HPI  67 y.o. Caucasian female with hypertension, hyperlipidemia, h/o partial thyroidectomy, parathyroidectomy, h/o passive smoking exposure, elevated coronary calcium score,referred for chest pain, dyspnea on exertion  Since starting medical therapy, symptoms of chest pain gave improved, but not resolved. She is also not doing heavy physical activity.  Reviewed recent test results with the patient, details below.    Initial consultation visit 04/2022: Patient moved from [place] to [place] around 2020. Her husband passed a few years ago. Fortunately, she then connected with her highschool sweetheart and they have ben together since. Patient has had progressively worsening exertional dyspnea and chest heaviness with minimal activity such as climbing a flight of stairs or two. Blood pressure is elevated. She has had chronic dry cough, predating her thyroid/parathyroid surgery. She was more active over a year or so ago with regular exercise, but has fallen off since then. Her husband (passed in 2020), smoke for several years. Patient has reportedly undergone lung function testing herself, which was normal. She underwent coronary calcium testing in 01/2022, details below.     Current Outpatient Medications:    acetaminophen (TYLENOL) 325 MG tablet, Take 650 mg by mouth every 6 (six) hours as needed., Disp: , Rfl:    amLODipine (NORVASC) 5 MG tablet, Take 1 tablet (5 mg total) by mouth daily., Disp: 90 tablet, Rfl: 3   aspirin EC 81 MG tablet, Take 1 tablet (81 mg total) by mouth daily. Swallow whole., Disp: 90 tablet, Rfl: 3   Carboxymethylcellul-Glycerin (LUBRICATING EYE DROPS OP), Place 1  drop into both eyes daily as needed (dry eyes)., Disp: , Rfl:    fluticasone (FLONASE) 50 MCG/ACT nasal spray, Place into both nostrils daily., Disp: , Rfl:    Ibuprofen 200 MG CAPS, Take by mouth., Disp: , Rfl:    levothyroxine (SYNTHROID) 112 MCG tablet, Take 1 tablet (112 mcg total) by mouth daily., Disp: 90 tablet, Rfl: 3   Menthol, Topical Analgesic, (ICY HOT) 16 % LIQD, Apply 1 application topically daily as needed (pain)., Disp: , Rfl:    metoprolol succinate (TOPROL-XL) 25 MG 24 hr tablet, Take 1 tablet (25 mg total) by mouth daily. Take with or immediately following a meal., Disp: 30 tablet, Rfl: 3   nitroGLYCERIN (NITROSTAT) 0.4 MG SL tablet, Place 1 tablet (0.4 mg total) under the tongue every 5 (five) minutes as needed for chest pain., Disp: 30 tablet, Rfl: 3   pantoprazole (PROTONIX) 40 MG tablet, TAKE 1 TABLET (40 MG TOTAL) BY MOUTH DAILY. TAKE 30-60 MIN BEFORE FIRST MEAL OF THE DAY, Disp: 90 tablet, Rfl: 3   rosuvastatin (CRESTOR) 10 MG tablet, TAKE 1 TABLET BY MOUTH EVERY DAY, Disp: 90 tablet, Rfl: 3   Vitamin D, Ergocalciferol, (DRISDOL) 1.25 MG (50000 UNIT) CAPS capsule, TAKE 1 CAPSULE (50,000 UNITS TOTAL) BY MOUTH EVERY MONDAY., Disp: 12 capsule, Rfl: 2   Cardiovascular and other pertinent studies:  Reviewed external labs and tests, independently interpreted  Exercise nuclear stress test 05/14/2022: Myocardial perfusion is normal. TID is positive at 1.27 which could be due to hypertensive response, however, cannot definitively rule out balanced ischemia. Overall LV systolic function is normal without  regional wall motion abnormalities. Stress LV EF: 83%.  Normal ECG stress. The patient exercised for 5 minutes and 36 seconds of a Bruce protocol, achieving approximately 7.05 METs and 86% MPHR. The heart rate response was normal. The blood pressure response was normal.   Echocardiogram 05/03/2022:  Left ventricle cavity is normal in size. Mild concentric hypertrophy of  the left  ventricle. Normal global wall motion. Doppler evidence of grade I  (impaired) diastolic dysfunction, elevated LAP. Normal LV systolic  function with visual EF 60-65%. Calculated EF 73%.  Left atrial cavity is mildly dilated.  Mild (Grade I) mitral regurgitation. Mild calcification of the mitral  valve annulus. Mild mitral valve leaflet calcification.  Structurally normal tricuspid valve.  Mild tricuspid regurgitation. No  evidence of pulmonary hypertension. RVSP measures 31 mmHg.  Structurally normal pulmonic valve.  Mild pulmonic regurgitation.   EKG 04/30/2022: Sinus rhythm 86 bpm  Left atrial enlargement  CT cardiac scoring 02/15/2022: LM: 0 LAD: 386 LCx: 4.27 RCA: 5.34   Total Agatston Score: 386 MESA database percentile: 93  Cholelithiasis. Aortic atherosclerosis. Multiple pulmonary nodules. Most significant: 3 mm solid pulmonary nodule.Per Fleischner Society Guidelines, no routine follow-up imaging is recommended.These guidelines do not apply to immunocompromised patients and patients with cancer. Follow up in patients with significant comorbidities as clinically warranted. For lung cancer screening, adhere to Lung-RADS guidelines. Reference: Radiology. 2017; 284(1):228-43        Recent labs: March-Oct 2023: Glucose 104, BUN/Cr 13/0.65. EGFR 96. Na/K 141/4.5. Rest of the CMP normal H/H 13/40. MCV 89. Platelets 288 HbA1C NA Chol 143, TG 82, HDL 56, LDL 71 TSH 1.0 normal, Free T4 2.1 high   Review of Systems  Constitutional: Positive for malaise/fatigue.  Cardiovascular:  Positive for chest pain and dyspnea on exertion. Negative for leg swelling, palpitations and syncope.         Vitals:   06/14/22 0836  BP: (!) 159/83  Pulse: 78  SpO2: 97%     Body mass index is 29.25 kg/m. Filed Weights   06/14/22 0836  Weight: 175 lb 12.8 oz (79.7 kg)     Objective:   Physical Exam Vitals and nursing note reviewed.  Constitutional:      General: She is  not in acute distress. Neck:     Vascular: No JVD.  Cardiovascular:     Rate and Rhythm: Normal rate and regular rhythm.     Heart sounds: Normal heart sounds. No murmur heard. Pulmonary:     Effort: Pulmonary effort is normal.     Breath sounds: Normal breath sounds. No wheezing or rales.         Visit diagnoses:   ICD-10-CM   1. Precordial pain  R07.2 aspirin EC 81 MG tablet    metoprolol succinate (TOPROL-XL) 50 MG 24 hr tablet    2. Dyspnea on exertion  R06.09 aspirin EC 81 MG tablet    metoprolol succinate (TOPROL-XL) 50 MG 24 hr tablet    3. Essential hypertension  I10 amLODipine (NORVASC) 10 MG tablet       Meds ordered this encounter  Medications   aspirin EC 81 MG tablet    Sig: Take 1 tablet (81 mg total) by mouth daily. Swallow whole.    Dispense:  90 tablet    Refill:  3   metoprolol succinate (TOPROL-XL) 50 MG 24 hr tablet    Sig: Take 1 tablet (50 mg total) by mouth daily. Take with or immediately following a meal.    Dispense:  30 tablet    Refill:  3   amLODipine (NORVASC) 10 MG tablet    Sig: Take 1 tablet (10 mg total) by mouth daily.    Dispense:  30 tablet    Refill:  2     Assessment & Recommendations:    67 y.o. Caucasian female with hypertension, hyperlipidemia, elevated coronary calcium score,referred for chest pain, dyspnea on exertion  Chest pain, dyspnea on exertion: Concerning for angina. High calcium score (01/2022). Stress test with no ischemia, but TID 1.27, concerning for balanced ischemia s/o multivessel CAD.  Discussed conservative medical therapy alone vs coronary revascularization, if necessary. Patient's significant other has a procedure tomorrow. At this time, patient would like to further optimize medical therapy, and think about the procedure., Increase metoprolol succinate to 50 mg daily, amlodipine to 10 mg daily.  Continue Aspirin 81 mg daily, rosuvastatin 10 mg daily, prn nitro.  Hypertension: Continue amlodipine.  Added metoprolol succinate.   Mixed hyperlipidemia: Chol 143, TG 82, HDL 56, LDL 71 (2023) Continue Crestor 10 mg.  F/u in 4 weeks    Nigel Mormon, MD Pager: [phone] Office: [phone]

## 2022-06-26 NOTE — Discharge Instructions (Signed)

## 2022-06-26 NOTE — Progress Notes (Signed)
Patient is ready for discharge, procedure site looks intact with no sign of hematoma and bleeding. Education was provided by [employer].

## 2022-06-26 NOTE — Interval H&P Note (Signed)
History and Physical Interval Note:  06/26/2022 7:40 AM  Courtney White  has presented today for surgery, with the diagnosis of dyspnea.  The various methods of treatment have been discussed with the patient and family. After consideration of risks, benefits and other options for treatment, the patient has consented to  Procedure(s): LEFT HEART CATH AND CORONARY ANGIOGRAPHY (N/A) as a surgical intervention.  The patient's history has been reviewed, patient examined, no change in status, stable for surgery.  I have reviewed the patient's chart and labs.  Questions were answered to the patient's satisfaction.    2016/2017 Appropriate Use Criteria for Coronary Revascularization Symptom Status: Ischemic Symptoms  Non-invasive Testing: Intermediate Risk  If no or indeterminate stress test, FFR/iFR results in all diseased vessels: N/A  Diabetes Mellitus: No  S/P CABG: No  Antianginal therapy (number of long-acting drugs): >=2  Patient undergoing renal transplant: No  Patient undergoing percutaneous valve procedure: No  1 Vessel Disease PCI CABG  No proximal LAD involvement, No proximal left dominant LCX involvement A (8); Indication 2 M (6); Indication 2  Proximal left dominant LCX involvement A (8); Indication 5 A (8); Indication 5  Proximal LAD involvement A (8); Indication 5 A (8); Indication 5  2 Vessel Disease  No proximal LAD involvement A (8); Indication 8 A (7); Indication 8  Proximal LAD involvement A (8); Indication 11 A (8); Indication 11  3 Vessel Disease  Low disease complexity (e.g., focal stenoses, SYNTAX <=22) A (8); Indication 17 A (8); Indication 17  Intermediate or high disease complexity (e.g., SYNTAX >=23) M (6); Indication 21 A (9); Indication 21  Left Main Disease  Isolated LMCA disease: ostial or midshaft A (7); Indication 24 A (9); Indication 24  Isolated LMCA disease: bifurcation involvement M (6); Indication 25 A (9); Indication 25  LMCA ostial or midshaft,  concurrent low disease burden multivessel disease (e.g., 1-2 additional focal stenoses, SYNTAX <=22) A (7); Indication 26 A (9); Indication 26  LMCA ostial or midshaft, concurrent intermediate or high disease burden multivessel disease (e.g., 1-2 additional bifurcation stenoses, long stenoses, SYNTAX >=23) M (4); Indication 27 A (9); Indication 27  LMCA bifurcation involvement, concurrent low disease burden multivessel disease (e.g., 1-2 additional focal stenoses, SYNTAX <=22) M (6); Indication 28 A (9); Indication 28  LMCA bifurcation involvement, concurrent intermediate or high disease burden multivessel disease (e.g., 1-2 additional bifurcation stenoses, long stenoses, SYNTAX >=23) R (3); Indication 29 A (9); Indication [place]

## 2022-06-26 NOTE — Progress Notes (Signed)
CARDIAC REHAB PHASE I   Post cath education including exercise guidelines, heart healthy and CRP2 reviewed. Pt referred to [facility] for CRP2 per MD request. Plan for home today.   [id number]    Vanessa Barbara, RN BSN 06/26/2022 11:34 AM

## 2022-06-27 ENCOUNTER — Encounter (HOSPITAL_COMMUNITY): Payer: Self-pay | Admitting: Cardiology

## 2022-06-28 MED FILL — Verapamil HCl IV Soln 2.5 MG/ML: INTRAVENOUS | Qty: 2 | Status: AC

## 2022-07-16 ENCOUNTER — Encounter: Payer: Self-pay | Admitting: Cardiology

## 2022-07-16 ENCOUNTER — Ambulatory Visit: Payer: Medicare Other | Admitting: Cardiology

## 2022-07-16 VITALS — BP 144/86 | HR 80 | Ht 65.0 in | Wt 174.8 lb

## 2022-07-16 DIAGNOSIS — I25118 Atherosclerotic heart disease of native coronary artery with other forms of angina pectoris: Secondary | ICD-10-CM

## 2022-07-16 DIAGNOSIS — R0609 Other forms of dyspnea: Secondary | ICD-10-CM

## 2022-07-16 DIAGNOSIS — R931 Abnormal findings on diagnostic imaging of heart and coronary circulation: Secondary | ICD-10-CM

## 2022-07-16 NOTE — Progress Notes (Unsigned)
Patient referred by [name], DO for chest pain, dyspnea on exertion  Subjective:   MAIKA HARDT, female    DOB: [birth year], 67 y.o.   MRN: [id number]   No chief complaint on file.    HPI  67 y.o. Caucasian female with hypertension, hyperlipidemia, h/o partial thyroidectomy, parathyroidectomy, h/o passive smoking exposure, elevated coronary calcium score,referred for chest pain, dyspnea on exertion  Since starting medical therapy, symptoms of chest pain gave improved, but not resolved. She is also not doing heavy physical activity.  Reviewed recent test results with the patient, details below.    Initial consultation visit 04/2022: 67 Patient moved from [place] to [place] around 2020. Her husband passed a few years ago. Fortunately, she then connected with her highschool sweetheart and they have ben together since. Patient has had progressively worsening exertional dyspnea and chest heaviness with minimal activity such as climbing a flight of stairs or two. Blood pressure is elevated. She has had chronic dry cough, predating her thyroid/parathyroid surgery. She was more active over a year or so ago with regular exercise, but has fallen off since then. Her husband (passed in 2020), smoke for several years. Patient has reportedly undergone lung function testing herself, which was normal. She underwent coronary calcium testing in 01/2022, details below.     Current Outpatient Medications:    acetaminophen (TYLENOL) 325 MG tablet, Take 650 mg by mouth every 6 (six) hours as needed for moderate pain., Disp: , Rfl:    amLODipine (NORVASC) 5 MG tablet, Take 1 tablet (5 mg total) by mouth daily., Disp: 90 tablet, Rfl: 3   aspirin EC 81 MG tablet, Take 1 tablet (81 mg total) by mouth daily. Swallow whole., Disp: 90 tablet, Rfl: 3   Carboxymethylcellul-Glycerin (LUBRICATING EYE DROPS OP), Place 1 drop into both eyes daily as needed (dry eyes)., Disp: , Rfl:     diclofenac Sodium (VOLTAREN) 1 % GEL, Apply 1 Application topically 3 (three) times daily as needed (joint pain)., Disp: , Rfl:    fluticasone (FLONASE) 50 MCG/ACT nasal spray, Place 1 spray into both nostrils daily as needed for allergies., Disp: , Rfl:    ibuprofen (ADVIL) 200 MG tablet, Take 400 mg by mouth every 8 (eight) hours as needed (pain)., Disp: , Rfl:    levothyroxine (SYNTHROID) 112 MCG tablet, Take 1 tablet (112 mcg total) by mouth daily., Disp: 90 tablet, Rfl: 3   metoprolol succinate (TOPROL-XL) 25 MG 24 hr tablet, Take 1 tablet (25 mg total) by mouth daily. Take with or immediately following a meal., Disp: 90 tablet, Rfl: 2   nitroGLYCERIN (NITROSTAT) 0.4 MG SL tablet, Place 1 tablet (0.4 mg total) under the tongue every 5 (five) minutes as needed for chest pain., Disp: 30 tablet, Rfl: 3   pantoprazole (PROTONIX) 40 MG tablet, TAKE 1 TABLET (40 MG TOTAL) BY MOUTH DAILY. TAKE 30-60 MIN BEFORE FIRST MEAL OF THE DAY, Disp: 90 tablet, Rfl: 3   rosuvastatin (CRESTOR) 10 MG tablet, TAKE 1 TABLET BY MOUTH EVERY DAY, Disp: 90 tablet, Rfl: 3   Turmeric 500 MG CAPS, Take 500 mg by mouth daily., Disp: , Rfl:    Vitamin D, Ergocalciferol, (DRISDOL) 1.25 MG (50000 UNIT) CAPS capsule, TAKE 1 CAPSULE (50,000 UNITS TOTAL) BY MOUTH EVERY MONDAY., Disp: 12 capsule, Rfl: 2   Cardiovascular and other pertinent studies:  Reviewed external labs and tests, independently interpreted  Coronary angiography 06/26/2022: LM: Normal LAD: Ostial 20%, mid diffuse 40%, mid to distal  diffuse 30% disease Lcx: Minimal luminal irregularities RCA: Minimal luminal irregularities   Resting Pd/Pa: 0.80 (Pullback performed after adenosine effect wore off showed truly diffuse disease with no focal step-up) FFR: 0.89 CFR: 3.1 IMR: 16      Resting Pd/Pa and FFR are both borderline abnormal.  However, microvascular indices are normal.  Pullback after adenosine effect wore off showed truly diffuse disease in LAD with  no focal stepup.   It is conceivable that she may have angina or angina equivalent symptoms from the amount of diffuse LAD disease as noted above.  However, there is no focal areas of severe stenoses that would benefit from stenting.  Moreover, I do not think a LIMA-LAD graft would mature given the borderline severe disease she has.  In addition to the coronary artery disease as described above, it is possible that there may be also component of deconditioning and obesity responsible for her dyspnea symptoms, as PFTs have been reportedly normal.   At this time, I recommend aggressive medical therapy for coronary artery disease, along with consideration for cardiopulmonary stress test, as well as cardiopulmonary rehab.   Unusually long procedure time due to severe tortuosity and spasm in the radial artery, requiring use of long stent which he  Exercise nuclear stress test 05/14/2022: Myocardial perfusion is normal. TID is positive at 1.27 which could be due to hypertensive response, however, cannot definitively rule out balanced ischemia. Overall LV systolic function is normal without regional wall motion abnormalities. Stress LV EF: 83%.  Normal ECG stress. The patient exercised for 5 minutes and 36 seconds of a Bruce protocol, achieving approximately 7.05 METs and 86% MPHR. The heart rate response was normal. The blood pressure response was normal.   Echocardiogram 05/03/2022:  Left ventricle cavity is normal in size. Mild concentric hypertrophy of  the left ventricle. Normal global wall motion. Doppler evidence of grade I  (impaired) diastolic dysfunction, elevated LAP. Normal LV systolic  function with visual EF 60-65%. Calculated EF 73%.  Left atrial cavity is mildly dilated.  Mild (Grade I) mitral regurgitation. Mild calcification of the mitral  valve annulus. Mild mitral valve leaflet calcification.  Structurally normal tricuspid valve.  Mild tricuspid regurgitation. No  evidence of  pulmonary hypertension. RVSP measures 31 mmHg.  Structurally normal pulmonic valve.  Mild pulmonic regurgitation.   EKG 04/30/2022: Sinus rhythm 86 bpm  Left atrial enlargement  CT cardiac scoring 02/15/2022: LM: 0 LAD: 386 LCx: 4.27 RCA: 5.34   Total Agatston Score: 386 MESA database percentile: 93  Cholelithiasis. Aortic atherosclerosis. Multiple pulmonary nodules. Most significant: 3 mm solid pulmonary nodule.Per Fleischner Society Guidelines, no routine follow-up imaging is recommended.These guidelines do not apply to immunocompromised patients and patients with cancer. Follow up in patients with significant comorbidities as clinically warranted. For lung cancer screening, adhere to Lung-RADS guidelines. Reference: Radiology. 2017; 284(1):228-43        Recent labs: March-Oct 2023: Glucose 104, BUN/Cr 13/0.65. EGFR 96. Na/K 141/4.5. Rest of the CMP normal H/H 13/40. MCV 89. Platelets 288 HbA1C NA Chol 143, TG 82, HDL 56, LDL 71 TSH 1.0 normal, Free T4 2.1 high   Review of Systems  Constitutional: Positive for malaise/fatigue.  Cardiovascular:  Positive for chest pain and dyspnea on exertion. Negative for leg swelling, palpitations and syncope.         There were no vitals filed for this visit.    There is no height or weight on file to calculate BMI. There were no vitals filed for this  visit.    Objective:   Physical Exam Vitals and nursing note reviewed.  Constitutional:      General: She is not in acute distress. Neck:     Vascular: No JVD.  Cardiovascular:     Rate and Rhythm: Normal rate and regular rhythm.     Heart sounds: Normal heart sounds. No murmur heard. Pulmonary:     Effort: Pulmonary effort is normal.     Breath sounds: Normal breath sounds. No wheezing or rales.         Visit diagnoses: No diagnosis found.    No orders of the defined types were placed in this encounter.    Assessment & Recommendations:    67 y.o.  Caucasian female with hypertension, hyperlipidemia, elevated coronary calcium score,referred for chest pain, dyspnea on exertion  Chest pain, dyspnea on exertion: Concerning for angina. High calcium score (01/2022). Stress test with no ischemia, but TID 1.27, concerning for balanced ischemia s/o multivessel CAD.  Discussed conservative medical therapy alone vs coronary revascularization, if necessary. Patient's significant other has a procedure tomorrow. At this time, patient would like to further optimize medical therapy, and think about the procedure., Increase metoprolol succinate to 50 mg daily, amlodipine to 10 mg daily.  Continue Aspirin 81 mg daily, rosuvastatin 10 mg daily, prn nitro.  Hypertension: Continue amlodipine. Added metoprolol succinate.   Mixed hyperlipidemia: Chol 143, TG 82, HDL 56, LDL 71 (2023) Continue Crestor 10 mg.  F/u in 4 weeks    Nigel Mormon, MD Pager: [phone] Office: [phone]

## 2022-07-17 ENCOUNTER — Telehealth: Payer: Medicare Other | Admitting: Family Medicine

## 2022-07-17 ENCOUNTER — Encounter: Payer: Self-pay | Admitting: Family Medicine

## 2022-07-17 ENCOUNTER — Encounter: Payer: Self-pay | Admitting: Cardiology

## 2022-07-17 DIAGNOSIS — J0100 Acute maxillary sinusitis, unspecified: Secondary | ICD-10-CM

## 2022-07-17 MED ORDER — GUAIFENESIN ER 600 MG PO TB12: 600.0000 mg | ORAL_TABLET | Freq: Two times a day (BID) | ORAL | 0 refills | Status: AC

## 2022-07-17 MED ORDER — FLUTICASONE PROPIONATE 50 MCG/ACT NA SUSP: 2.0000 | Freq: Every day | NASAL | 6 refills | Status: DC

## 2022-07-17 MED ORDER — AMOXICILLIN-POT CLAVULANATE 875-125 MG PO TABS: 1.0000 | ORAL_TABLET | Freq: Two times a day (BID) | ORAL | 0 refills | Status: AC

## 2022-07-17 NOTE — Progress Notes (Signed)
Virtual Visit via Video   I connected with patient on 07/17/22 at 1300 by a video enabled telemedicine application and verified that I am speaking with the correct person using two identifiers.  Location patient: Home Location provider: [place] participating in the virtual visit: Patient and Provider  I discussed the limitations of evaluation and management by telemedicine and the availability of in person appointments. The patient expressed understanding and agreed to proceed.  Subjective:   HPI:  Pt presents today for  Chief Complaint  Patient presents with   Sinus Problem   Sinus Problem This is a new problem. Episode onset: over 7 days ago. The problem has been gradually worsening since onset. The pain is moderate. Associated symptoms include chills, congestion, coughing, ear pain, headaches, sinus pressure and a sore throat. Pertinent negatives include no diaphoresis, hoarse voice, neck pain, shortness of breath, sneezing or swollen glands. Past treatments include acetaminophen and oral decongestants. The treatment provided no relief.     Review of Systems  Constitutional:  Positive for chills and malaise/fatigue. Negative for diaphoresis, fever and weight loss.  HENT:  Positive for congestion, ear pain, sinus pressure, sinus pain and sore throat. Negative for ear discharge, hearing loss, hoarse voice, nosebleeds, sneezing and tinnitus.   Respiratory:  Positive for cough. Negative for hemoptysis, sputum production, shortness of breath, wheezing and stridor.   Cardiovascular:  Negative for chest pain and palpitations.  Genitourinary:  Negative for dysuria.  Musculoskeletal:  Negative for neck pain.  Skin:  Negative for itching and rash.  Neurological:  Positive for headaches. Negative for dizziness, tingling, tremors, sensory change, speech change, focal weakness, seizures, loss of consciousness and weakness.  All other systems  reviewed and are negative.    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   Neck nodule 08/01/2020   DOE (dyspnea on exertion) 07/13/2020   Headache 07/13/2020   Educated about COVID-19 virus infection 11/24/2019   Precordial pain 11/24/2019   Enthesitis 10/22/2019   Acute right ankle pain 10/22/2019   Family history of dementia 03/31/2019   Essential hypertension 03/31/2019   Vitiligo 02/25/2019   Acquired hypothyroidism 02/25/2019   Vitamin D deficiency 02/25/2019   Elevated blood pressure reading in office without diagnosis of hypertension 02/25/2019    Social History   Tobacco Use   Smoking status: Never   Smokeless tobacco: Never  Substance Use Topics   Alcohol use: Yes    Comment: occ    Current Outpatient Medications:    amoxicillin-clavulanate (AUGMENTIN) 875-125 MG tablet, Take 1 tablet by mouth 2 (two) times daily for 10 days., Disp: 20 tablet, Rfl: 0   fluticasone (FLONASE) 50 MCG/ACT nasal spray, Place 2 sprays into both nostrils daily., Disp: 16 g, Rfl: 6   guaiFENesin (MUCINEX) 600 MG 12 hr tablet, Take 1 tablet (600 mg total) by mouth 2 (two) times daily for 10 days., Disp: 20 tablet, Rfl: 0   acetaminophen (TYLENOL) 325 MG tablet, Take 650 mg by mouth every 6 (six) hours as needed for moderate pain., Disp: , Rfl:    amLODipine (NORVASC) 5 MG tablet, Take 1 tablet (5 mg total) by mouth daily., Disp: 90 tablet, Rfl: 3  aspirin EC 81 MG tablet, Take 1 tablet (81 mg total) by mouth daily. Swallow whole., Disp: 90 tablet, Rfl: 3   Carboxymethylcellul-Glycerin (LUBRICATING EYE DROPS OP), Place 1 drop into both eyes daily as needed (dry eyes)., Disp: , Rfl:    diclofenac Sodium  (VOLTAREN) 1 % GEL, Apply 1 Application topically 3 (three) times daily as needed (joint pain)., Disp: , Rfl:    Elderberry 500 MG CAPS, Take by mouth., Disp: , Rfl:    ibuprofen (ADVIL) 200 MG tablet, Take 400 mg by mouth every 8 (eight) hours as needed (pain)., Disp: , Rfl:    levothyroxine (SYNTHROID) 112 MCG tablet, Take 1 tablet (112 mcg total) by mouth daily., Disp: 90 tablet, Rfl: 3   metoprolol succinate (TOPROL-XL) 25 MG 24 hr tablet, Take 1 tablet (25 mg total) by mouth daily. Take with or immediately following a meal., Disp: 90 tablet, Rfl: 2   nitroGLYCERIN (NITROSTAT) 0.4 MG SL tablet, Place 1 tablet (0.4 mg total) under the tongue every 5 (five) minutes as needed for chest pain., Disp: 30 tablet, Rfl: 3   Phenyleph-Doxylamine-DM-APAP (ALKA-SELTZER PLS ALLERGY & CGH) 5-6.25-10-325 MG CAPS, Take by mouth., Disp: , Rfl:    rosuvastatin (CRESTOR) 10 MG tablet, TAKE 1 TABLET BY MOUTH EVERY DAY, Disp: 90 tablet, Rfl: 3   Turmeric 500 MG CAPS, Take 500 mg by mouth daily., Disp: , Rfl:    Vitamin D, Ergocalciferol, (DRISDOL) 1.25 MG (50000 UNIT) CAPS capsule, TAKE 1 CAPSULE (50,000 UNITS TOTAL) BY MOUTH EVERY MONDAY., Disp: 12 capsule, Rfl: 2  No Known Allergies  Objective:  No vitals taken as this was a virtual visit.   Patient is well-developed, well-nourished in no acute distress.  Resting comfortably at home.  Head is normocephalic, atraumatic.  No labored breathing.  Speech is clear and coherent with logical content.  Patient is alert and oriented at baseline.   Assessment and Plan:   Nkenge was seen today for sinus problem.  Diagnoses and all orders for this visit:  Acute non-recurrent maxillary sinusitis Ongoing symptoms despite symptomatic management at home. Will add flonase, mucinex, and augmentin to regimen. Aware of need for adequate hydration. Report new, worsening, or persistent symptoms.  -     amoxicillin-clavulanate (AUGMENTIN) 875-125 MG tablet; Take 1 tablet by  mouth 2 (two) times daily for 10 days. -     fluticasone (FLONASE) 50 MCG/ACT nasal spray; Place 2 sprays into both nostrils daily. -     guaiFENesin (MUCINEX) 600 MG 12 hr tablet; Take 1 tablet (600 mg total) by mouth 2 (two) times daily for 10 days.      Return if symptoms worsen or fail to improve.  Monia Pouch, FNP-C [facility] [address] [place], [place] [address] [phone]  07/17/2022  Time spent with the patient: 12 minutes, of which >50% was spent in obtaining information about symptoms, reviewing previous labs, evaluations, and treatments, counseling about condition (please see the discussed topics above), and developing a plan to further investigate it; had a number of questions which I addressed.

## 2022-07-25 ENCOUNTER — Other Ambulatory Visit: Payer: Self-pay | Admitting: Internal Medicine

## 2022-08-06 ENCOUNTER — Encounter: Payer: Self-pay | Admitting: *Deleted

## 2022-08-07 ENCOUNTER — Encounter: Payer: Self-pay | Admitting: Family Medicine

## 2022-08-13 ENCOUNTER — Ambulatory Visit (HOSPITAL_COMMUNITY): Payer: Medicare Other | Attending: Family Medicine

## 2022-08-13 DIAGNOSIS — R0600 Dyspnea, unspecified: Secondary | ICD-10-CM | POA: Diagnosis not present

## 2022-08-13 DIAGNOSIS — R0609 Other forms of dyspnea: Secondary | ICD-10-CM | POA: Diagnosis present

## 2022-08-16 ENCOUNTER — Other Ambulatory Visit: Payer: Self-pay | Admitting: *Deleted

## 2022-08-16 ENCOUNTER — Telehealth: Payer: Self-pay

## 2022-08-16 DIAGNOSIS — I10 Essential (primary) hypertension: Secondary | ICD-10-CM

## 2022-08-16 DIAGNOSIS — E782 Mixed hyperlipidemia: Secondary | ICD-10-CM

## 2022-08-16 DIAGNOSIS — E039 Hypothyroidism, unspecified: Secondary | ICD-10-CM

## 2022-08-16 NOTE — Telephone Encounter (Signed)
done

## 2022-08-18 NOTE — Progress Notes (Signed)
No significant heart or lung abnormality noted. I do think contnues weight loss and increasing exercise will improve your breathing.  Thanks MJP

## 2022-08-20 NOTE — Progress Notes (Signed)
Called patient no answer left a vm

## 2022-08-21 NOTE — Progress Notes (Signed)
Called patient no answer could not leave a vm due to vm being full

## 2022-08-21 NOTE — Progress Notes (Signed)
LMTCB

## 2022-08-23 NOTE — Progress Notes (Signed)
Called patient to inform her about her stress test. Patient understood.

## 2022-08-30 IMAGING — DX DG CHEST 2V
2 series · 2 of 2 positions shown · non-contrast
Comparison: Chest radiograph dated 04/21/2012.

CLINICAL DATA: Dyspnea on exertion.

EXAM:
CHEST - 2 VIEW

[chest pa]
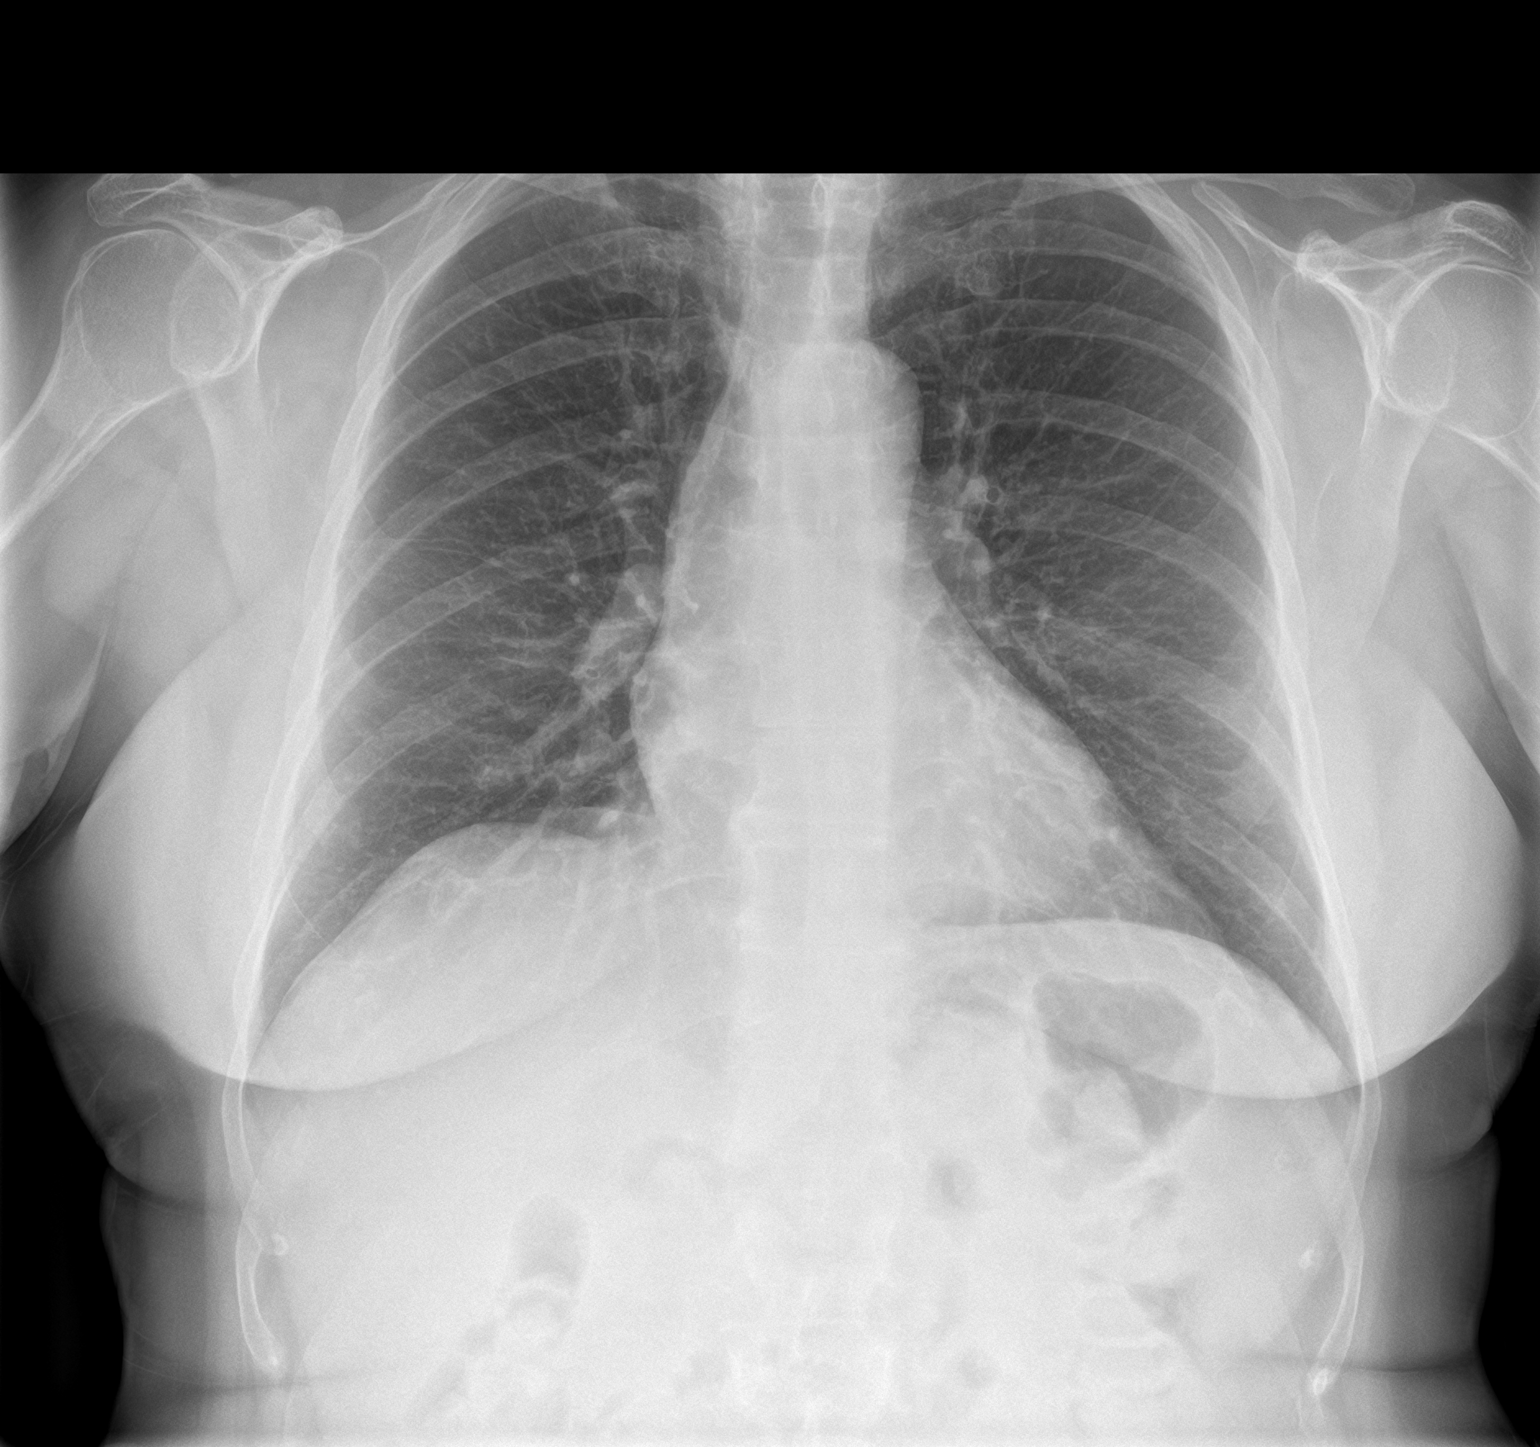

[chest lat]
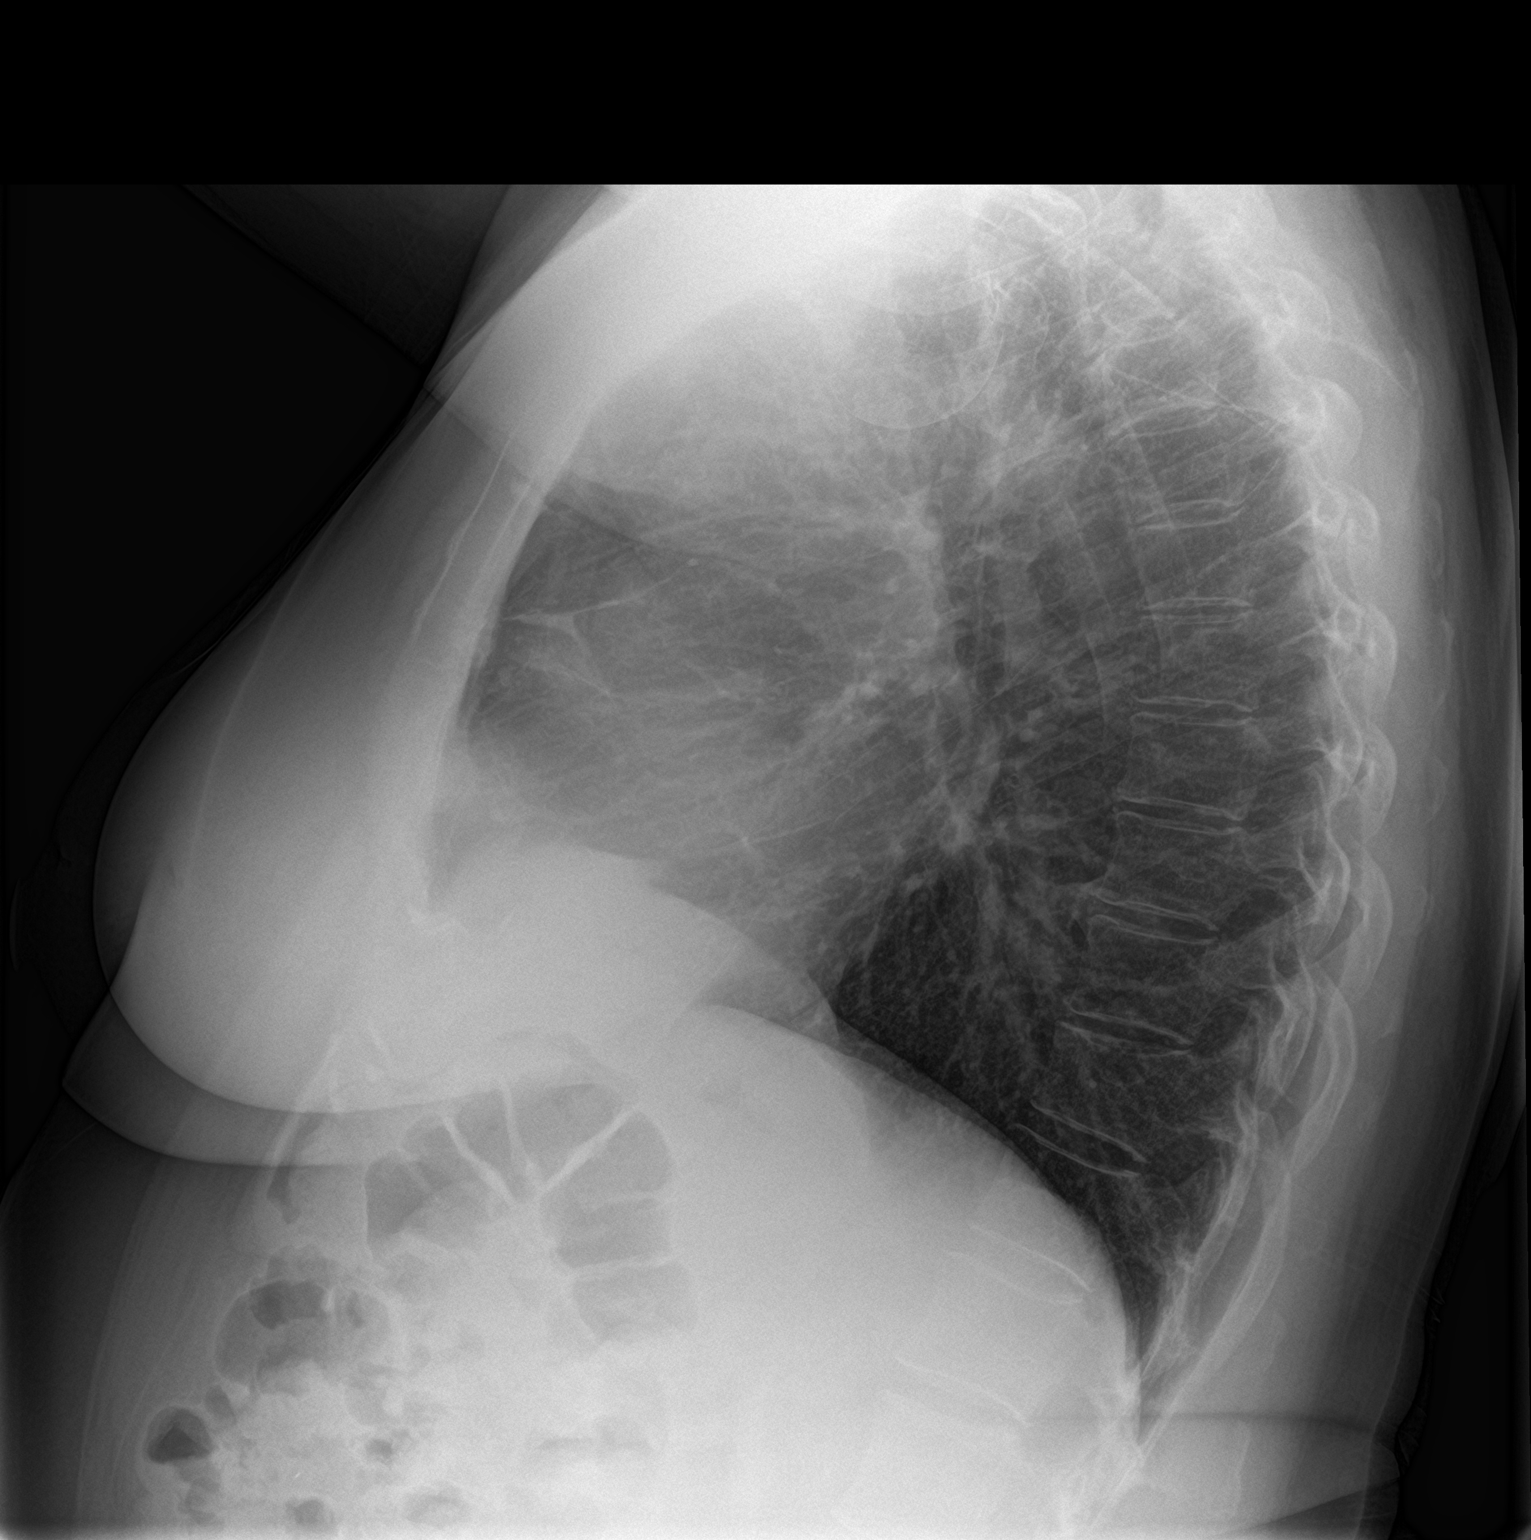

[2 of 2 positions shown; findings below may reference images not displayed]

FINDINGS: The heart size and mediastinal contours are within normal limits.
Both lungs are clear. The visualized skeletal structures are
unremarkable.
IMPRESSION: No active cardiopulmonary disease.

## 2022-09-09 ENCOUNTER — Other Ambulatory Visit: Payer: Self-pay | Admitting: Cardiology

## 2022-09-09 DIAGNOSIS — I10 Essential (primary) hypertension: Secondary | ICD-10-CM

## 2022-09-10 ENCOUNTER — Other Ambulatory Visit: Payer: Self-pay | Admitting: Cardiology

## 2022-09-10 DIAGNOSIS — R072 Precordial pain: Secondary | ICD-10-CM

## 2022-09-10 DIAGNOSIS — R0609 Other forms of dyspnea: Secondary | ICD-10-CM

## 2022-09-19 ENCOUNTER — Other Ambulatory Visit: Payer: Self-pay | Admitting: Family Medicine

## 2022-09-26 ENCOUNTER — Other Ambulatory Visit: Payer: Medicare Other

## 2022-09-26 DIAGNOSIS — E782 Mixed hyperlipidemia: Secondary | ICD-10-CM

## 2022-09-26 DIAGNOSIS — E039 Hypothyroidism, unspecified: Secondary | ICD-10-CM

## 2022-09-26 DIAGNOSIS — I10 Essential (primary) hypertension: Secondary | ICD-10-CM

## 2022-09-26 LAB — CMP14+EGFR
BUN: 17 mg/dL (ref 8–27)
Chloride: 106 mmol/L (ref 96–106)
Glucose: 118 mg/dL — ABNORMAL HIGH (ref 70–99)

## 2022-09-26 LAB — LIPID PANEL

## 2022-09-26 LAB — CBC WITH DIFFERENTIAL/PLATELET
Eos: 4 %
Hemoglobin: 12.9 g/dL (ref 11.1–15.9)
MCH: 29.3 pg (ref 26.6–33.0)

## 2022-09-27 LAB — CBC WITH DIFFERENTIAL/PLATELET
Basophils Absolute: 0.1 10*3/uL (ref 0.0–0.2)
Basos: 1 %
EOS (ABSOLUTE): 0.2 10*3/uL (ref 0.0–0.4)
Hematocrit: 39.3 % (ref 34.0–46.6)
Immature Grans (Abs): 0 10*3/uL (ref 0.0–0.1)
Immature Granulocytes: 0 %
Lymphocytes Absolute: 1.6 10*3/uL (ref 0.7–3.1)
Lymphs: 24 %
MCHC: 32.8 g/dL (ref 31.5–35.7)
MCV: 89 fL (ref 79–97)
Monocytes Absolute: 0.4 10*3/uL (ref 0.1–0.9)
Monocytes: 5 %
Neutrophils Absolute: 4.5 10*3/uL (ref 1.4–7.0)
Neutrophils: 66 %
Platelets: 271 10*3/uL (ref 150–450)
RBC: 4.4 x10E6/uL (ref 3.77–5.28)
RDW: 13 % (ref 11.7–15.4)
WBC: 6.8 10*3/uL (ref 3.4–10.8)

## 2022-09-27 LAB — CMP14+EGFR
ALT: 19 IU/L (ref 0–32)
AST: 16 IU/L (ref 0–40)
Albumin: 4.4 g/dL (ref 3.9–4.9)
Alkaline Phosphatase: 93 IU/L (ref 44–121)
BUN/Creatinine Ratio: 26 (ref 12–28)
Bilirubin Total: 0.4 mg/dL (ref 0.0–1.2)
CO2: 21 mmol/L (ref 20–29)
Calcium: 9.8 mg/dL (ref 8.7–10.3)
Creatinine, Ser: 0.65 mg/dL (ref 0.57–1.00)
Potassium: 4.5 mmol/L (ref 3.5–5.2)
Sodium: 140 mmol/L (ref 134–144)
Total Protein: 6.9 g/dL (ref 6.0–8.5)

## 2022-09-27 LAB — LIPID PANEL
Chol/HDL Ratio: 2.8 ratio (ref 0.0–4.4)
Cholesterol, Total: 146 mg/dL (ref 100–199)
LDL Chol Calc (NIH): 75 mg/dL (ref 0–99)
Triglycerides: 94 mg/dL (ref 0–149)
VLDL Cholesterol Cal: 18 mg/dL (ref 5–40)

## 2022-09-27 LAB — TSH+FREE T4
Free T4: 1.59 ng/dL (ref 0.82–1.77)
TSH: 2.64 u[IU]/mL (ref 0.450–4.500)

## 2022-09-28 ENCOUNTER — Other Ambulatory Visit: Payer: Medicare Other

## 2022-10-05 ENCOUNTER — Ambulatory Visit: Payer: Medicare Other | Admitting: Family Medicine

## 2022-10-05 ENCOUNTER — Telehealth: Payer: Self-pay | Admitting: Family Medicine

## 2022-10-05 ENCOUNTER — Other Ambulatory Visit: Payer: Self-pay | Admitting: Family Medicine

## 2022-10-05 ENCOUNTER — Encounter: Payer: Self-pay | Admitting: Family Medicine

## 2022-10-05 VITALS — BP 130/80 | HR 75 | Temp 97.5°F | Ht 65.0 in | Wt 173.8 lb

## 2022-10-05 DIAGNOSIS — E782 Mixed hyperlipidemia: Secondary | ICD-10-CM

## 2022-10-05 DIAGNOSIS — I25118 Atherosclerotic heart disease of native coronary artery with other forms of angina pectoris: Secondary | ICD-10-CM

## 2022-10-05 DIAGNOSIS — R739 Hyperglycemia, unspecified: Secondary | ICD-10-CM

## 2022-10-05 DIAGNOSIS — E890 Postprocedural hypothyroidism: Secondary | ICD-10-CM | POA: Diagnosis not present

## 2022-10-05 DIAGNOSIS — E663 Overweight: Secondary | ICD-10-CM

## 2022-10-05 DIAGNOSIS — N951 Menopausal and female climacteric states: Secondary | ICD-10-CM

## 2022-10-05 DIAGNOSIS — N941 Unspecified dyspareunia: Secondary | ICD-10-CM

## 2022-10-05 LAB — BAYER DCA HB A1C WAIVED: HB A1C (BAYER DCA - WAIVED): 5.6 % (ref 4.8–5.6)

## 2022-10-05 MED ORDER — WEGOVY 0.5 MG/0.5ML ~~LOC~~ SOAJ
0.5000 mg | SUBCUTANEOUS | 0 refills | Status: DC
Start: 2022-10-05 — End: 2023-07-25

## 2022-10-05 MED ORDER — WEGOVY 0.25 MG/0.5ML ~~LOC~~ SOAJ
0.2500 mg | SUBCUTANEOUS | 0 refills | Status: DC
Start: 2022-10-05 — End: 2023-07-25

## 2022-10-05 MED ORDER — OSPHENA 60 MG PO TABS
60.0000 mg | ORAL_TABLET | Freq: Every day | ORAL | 3 refills | Status: DC
Start: 2022-10-05 — End: 2023-07-25

## 2022-10-05 NOTE — Patient Instructions (Signed)
Tips for success with Wegovy (and by success, how not to be super sick on your stomach): Eat small meals AVOID heavy foods (fried/ high in carbs like bread, pasta, rice) AVOID carbonated beverages (soda/ beer, as these can increase bloating) DOUBLE your water intake (will help you avoid constipation/ dehydration)  Wegovy CAN cause: Nausea Abdominal pain Increased acid reflux (sometimes presents as "sour burps") Constipation OR Diarrhea Fatigue (especially when you first start it)  

## 2022-10-05 NOTE — Telephone Encounter (Signed)
Name from pharmacy: WEGOVY 0.25 MG/0.5 ML PEN  Name from pharmacy: WEGOVY 0.5 MG/0.5 ML PEN   Pharmacy comment: Alternative Requested:NOT COVERED.

## 2022-10-05 NOTE — Telephone Encounter (Signed)
Raynelle Fanning, can you look into this for me?

## 2022-10-05 NOTE — Progress Notes (Signed)
Subjective: CC: Hypothyroidism PCP: Raliegh Ip, DO Courtney White is a 67 y.o. female presenting to clinic today for:  1.  Postoperative hypothyroidism Patient reports compliance with thyroid medication.  She reports constipation and difficulty losing weight despite diet modification.  No heart palpitations, difficulty swallowing  2.  CAD, hyperlipidemia associated with overweight BMI Has been evaluated by Dr. Rosemary Holms.  Had cardiac cath performed which did show CAD in the LAD.  She is subsequently treated with aspirin, metoprolol and amlodipine.  Also treated with Crestor.  She is closely monitored by her cardiologist with last OV in March and next OV planned for July.  She denies any chest pain but does report shortness of breath with ambulation up stairs.  3.  Vaginal dryness She reports vaginal dryness and this subsequently has led to pain with intercourse.  She desires intercourse but notes that despite lubricant use she continues to have dyspareunia.  No abnormal vaginal bleeding.  ROS: Per HPI  No Known Allergies Past Medical History:  Diagnosis Date   COVID-19 12/02/2020   Hepatitis C 1992   s/p treatment.  she has cleared virus   HTN (hypertension)    Thyroid disease    Vitiligo     Current Outpatient Medications:    acetaminophen (TYLENOL) 325 MG tablet, Take 650 mg by mouth every 6 (six) hours as needed for moderate pain., Disp: , Rfl:    amLODipine (NORVASC) 5 MG tablet, Take 1 tablet (5 mg total) by mouth daily., Disp: 90 tablet, Rfl: 3   aspirin EC 81 MG tablet, Take 1 tablet (81 mg total) by mouth daily. Swallow whole., Disp: 90 tablet, Rfl: 3   Carboxymethylcellul-Glycerin (LUBRICATING EYE DROPS OP), Place 1 drop into both eyes daily as needed (dry eyes)., Disp: , Rfl:    diclofenac Sodium (VOLTAREN) 1 % GEL, Apply 1 Application topically 3 (three) times daily as needed (joint pain)., Disp: , Rfl:    Elderberry 500 MG CAPS, Take by mouth., Disp:  , Rfl:    fluticasone (FLONASE) 50 MCG/ACT nasal spray, Place 2 sprays into both nostrils daily., Disp: 16 g, Rfl: 6   ibuprofen (ADVIL) 200 MG tablet, Take 400 mg by mouth every 8 (eight) hours as needed (pain)., Disp: , Rfl:    levothyroxine (SYNTHROID) 112 MCG tablet, Take 1 tablet (112 mcg total) by mouth daily., Disp: 90 tablet, Rfl: 3   metoprolol succinate (TOPROL-XL) 25 MG 24 hr tablet, Take 1 tablet (25 mg total) by mouth daily. Take with or immediately following a meal., Disp: 90 tablet, Rfl: 2   rosuvastatin (CRESTOR) 10 MG tablet, TAKE 1 TABLET BY MOUTH EVERY DAY, Disp: 90 tablet, Rfl: 3   Vitamin D, Ergocalciferol, (DRISDOL) 1.25 MG (50000 UNIT) CAPS capsule, TAKE 1 CAPSULE (50,000 UNITS TOTAL) BY MOUTH EVERY MONDAY., Disp: 12 capsule, Rfl: 3   nitroGLYCERIN (NITROSTAT) 0.4 MG SL tablet, Place 1 tablet (0.4 mg total) under the tongue every 5 (five) minutes as needed for chest pain., Disp: 30 tablet, Rfl: 3 Social History   Socioeconomic History   Marital status: Significant Other    Spouse name: Not on file   Number of children: 0   Years of education: Not on file   Highest education level: Not on file  Occupational History   Not on file  Tobacco Use   Smoking status: Never   Smokeless tobacco: Never  Vaping Use   Vaping Use: Never used  Substance and Sexual Activity   Alcohol use:  Yes    Comment: occ   Drug use: Not Currently   Sexual activity: Yes    Birth control/protection: Post-menopausal  Other Topics Concern   Not on file  Social History Narrative   Patient is a retired Arts development officer.  She is a widower and notes that she moved from the [place] area after being reunited with a high school sweetheart whom she is now engaged to.   She lives locally in [place].   No children.   Social Determinants of Health   Financial Resource Strain: Low Risk  (03/22/2022)   Overall Financial Resource Strain (CARDIA)    Difficulty of Paying Living Expenses: Not hard  at all  Food Insecurity: No Food Insecurity (03/22/2022)   Hunger Vital Sign    Worried About Running Out of Food in the Last Year: Never true    Ran Out of Food in the Last Year: Never true  Transportation Needs: No Transportation Needs (03/22/2022)   PRAPARE - Administrator, Civil Service (Medical): No    Lack of Transportation (Non-Medical): No  Physical Activity: Insufficiently Active (03/22/2022)   Exercise Vital Sign    Days of Exercise per Week: 3 days    Minutes of Exercise per Session: 40 min  Stress: No Stress Concern Present (03/22/2022)   [employer] of Occupational Health - Occupational Stress Questionnaire    Feeling of Stress : Not at all  Social Connections: Socially Integrated (03/22/2022)   Social Connection and Isolation Panel [NHANES]    Frequency of Communication with Friends and Family: More than three times a week    Frequency of Social Gatherings with Friends and Family: More than three times a week    Attends Religious Services: More than 4 times per year    Active Member of [employer] or Organizations: Yes    Attends Banker Meetings: More than 4 times per year    Marital Status: Living with partner  Intimate Partner Violence: Not At Risk (03/22/2022)   Humiliation, Afraid, Rape, and Kick questionnaire    Fear of Current or Ex-Partner: No    Emotionally Abused: No    Physically Abused: No    Sexually Abused: No   Family History  Problem Relation Age of Onset   Diabetes Mother    Depression Mother    Hypertension Mother    Lung cancer Father    Hypertension Sister    Hypertension Sister    Thyroid disease Maternal Aunt     Objective: Office vital signs reviewed. BP 130/80   Pulse 75   Temp (!) 97.5 F (36.4 C) (Temporal)   Ht 5\' 5"  (1.651 m)   Wt 173 lb 12.8 oz (78.8 kg)   LMP  (LMP Unknown)   SpO2 95%   BMI 28.92 kg/m   Physical Examination:  General: Awake, alert, well-appearing, overweight female, No acute  distress HEENT: Sclera white.  Moist mucous membranes.  No exophthalmos Cardio: regular rate and rhythm, S1S2 heard, no murmurs appreciated Pulm: clear to auscultation bilaterally, no wheezes, rhonchi or rales; normal work of breathing on room air GI: Obese, soft, non-tender, non-distended, bowel sounds present x4, no hepatomegaly, no splenomegaly, no masses  Assessment/ Plan: 67 y.o. female   Postoperative hypothyroidism  Elevated serum glucose - Plan: Bayer DCA Hb A1c Waived, Semaglutide-Weight Management (WEGOVY) 0.25 MG/0.5ML SOAJ, Semaglutide-Weight Management (WEGOVY) 0.5 MG/0.5ML SOAJ  Mixed hyperlipidemia - Plan: Semaglutide-Weight Management (WEGOVY) 0.25 MG/0.5ML SOAJ, Semaglutide-Weight Management (WEGOVY) 0.5 MG/0.5ML SOAJ  Coronary  artery disease involving native coronary artery of native heart with other form of angina pectoris (HCC) - Plan: Semaglutide-Weight Management (WEGOVY) 0.25 MG/0.5ML SOAJ, Semaglutide-Weight Management (WEGOVY) 0.5 MG/0.5ML SOAJ  Overweight (BMI 25.0-29.9) - Plan: Semaglutide-Weight Management (WEGOVY) 0.25 MG/0.5ML SOAJ, Semaglutide-Weight Management (WEGOVY) 0.5 MG/0.5ML SOAJ  Vaginal dryness, menopausal - Plan: Ospemifene (OSPHENA) 60 MG TABS  Dyspareunia in female - Plan: Ospemifene (OSPHENA) 60 MG TABS  We reviewed her thyroid results were within normal range  I am going to collect an A1c given elevation in blood sugar noted on the fasting metabolic lab  We discussed Wegovy indication for cardiovascular disease.  I think that she be a good candidate for this.  She has no personal or family history of medullary thyroid cancers or multiple endocrine type II neoplasia.  No personal history of pancreatitis.  We discussed that she has about a 20 to 25% cardiovascular risk reduction with the GLP.  Will plan to order compounded semaglutide if her insurance does not cover this medication.  We discussed troubleshooting the medication, appropriate  hydration etc.  For her vaginal dryness we talked about options for treatment including topical estrogens versus nonhormonal treatment with Osphena.  She would like to proceed with Osphena and this has been sent to pharmacy.  I like to see her closely back in the next 3 months for recheck, sooner if concerns arise  No orders of the defined types were placed in this encounter.  No orders of the defined types were placed in this encounter.    Raliegh Ip, DO [facility] [phone]

## 2022-10-05 NOTE — Telephone Encounter (Signed)
  Name from pharmacy: WEGOVY 0.25 MG/0.5 ML PEN  Name from pharmacy: WEGOVY 0.5 MG/0.5 ML PEN   Pharmacy comment: Alternative Requested:NOT COVERED.

## 2022-10-05 NOTE — Telephone Encounter (Signed)
Patient has appt 9/11 for follow up with PCP and needs fasting labs. Wanted to come in early for these. Scheduled her for 9/6. Please add orders.

## 2022-10-08 NOTE — Telephone Encounter (Signed)
Patient aware that lab orders have been placed.  

## 2022-10-18 ENCOUNTER — Other Ambulatory Visit: Payer: Self-pay | Admitting: Cardiology

## 2022-10-18 ENCOUNTER — Telehealth: Payer: Self-pay | Admitting: Family Medicine

## 2022-10-18 DIAGNOSIS — R072 Precordial pain: Secondary | ICD-10-CM

## 2022-10-18 DIAGNOSIS — I10 Essential (primary) hypertension: Secondary | ICD-10-CM

## 2022-10-18 DIAGNOSIS — R0609 Other forms of dyspnea: Secondary | ICD-10-CM

## 2022-10-19 ENCOUNTER — Other Ambulatory Visit (HOSPITAL_COMMUNITY): Payer: Self-pay

## 2022-10-19 ENCOUNTER — Other Ambulatory Visit: Payer: Self-pay | Admitting: Pharmacist

## 2022-10-19 ENCOUNTER — Telehealth: Payer: Self-pay

## 2022-10-19 ENCOUNTER — Encounter: Payer: Self-pay | Admitting: Pharmacist

## 2022-10-19 NOTE — Telephone Encounter (Signed)
Resubmitted PA. If denied again, the denial letter will give the directions for appeal. Please be advised we currently do not have a Pharmacist to review denials, therefore you will need to process appeals accordingly as needed. Thanks for your support at this time.  Created new encounter for PA. Will route back to pool once determination has been made.

## 2022-10-19 NOTE — Progress Notes (Addendum)
   PRIOR AUTHORIZATION  Subjective   Patient ID: Courtney White, female    DOB: 1956/04/06  Age: 67 y.o. MRN: [id number]  Chief Complaint  Patient presents with   Prior Auth    WEGOVY-CAD    Objective:     Ht 5\' 5"  (1.651 m)   Wt 174 lb (78.9 kg)   LMP  (LMP Unknown)   BMI 28.96 kg/m    Cardiovascular: Aortic atherosclerosis. Extensive mitral annular calcifications. CAC score 386 I25.10, I25.84, I70  PA submitted via covery my meds    Kieth Brightly, PharmD, BCACP Clinical Pharmacist, [facility]

## 2022-10-19 NOTE — Telephone Encounter (Signed)
Pharmacy Patient Advocate Encounter   Received notification that prior authorization for [facility] 0.25MG /0.5ML auto-injectors is required/requested.   PA submitted to [facility] via CoverMyMeds Key or (Medicaid) confirmation # [id number] Status is pending

## 2022-10-22 NOTE — Telephone Encounter (Signed)
Pharmacy Patient Advocate Encounter  Received notification from [employer] that the request for prior authorization for [facility] has been denied due to plan exclusion.       Please be advised we currently do not have a Pharmacist to review denials, therefore you will need to process appeals accordingly as needed. Thanks for your support at this time.   You may call the toll-free member number listed on insurance card or fax [phone], to appeal.

## 2022-10-29 ENCOUNTER — Ambulatory Visit: Payer: Medicare Other | Admitting: Cardiology

## 2022-10-30 NOTE — Telephone Encounter (Signed)
Can you follow up on this one?  We started it for CAD

## 2022-10-31 NOTE — Telephone Encounter (Signed)
Contacted patient and left message to contact office

## 2022-10-31 NOTE — Telephone Encounter (Signed)
Unfortunately, it appears to be a plan exclusion meaning drug is not covered under any circumstances (even with appeal).  I would ask patient to call her insurance and see if anything can be done? We coded for cardiac appropriately, but not all medicare plans have picked up   Let me know response

## 2022-12-21 NOTE — Progress Notes (Signed)
Error

## 2022-12-27 ENCOUNTER — Other Ambulatory Visit: Payer: Medicare Other

## 2022-12-27 DIAGNOSIS — E782 Mixed hyperlipidemia: Secondary | ICD-10-CM

## 2022-12-27 DIAGNOSIS — E890 Postprocedural hypothyroidism: Secondary | ICD-10-CM

## 2022-12-27 DIAGNOSIS — E039 Hypothyroidism, unspecified: Secondary | ICD-10-CM

## 2022-12-28 ENCOUNTER — Other Ambulatory Visit: Payer: Medicare Other

## 2022-12-28 LAB — CBC WITH DIFFERENTIAL/PLATELET
Basophils Absolute: 0.1 10*3/uL (ref 0.0–0.2)
Basos: 1 %
EOS (ABSOLUTE): 0.3 10*3/uL (ref 0.0–0.4)
Eos: 4 %
Hematocrit: 40.2 % (ref 34.0–46.6)
Hemoglobin: 13.1 g/dL (ref 11.1–15.9)
Immature Grans (Abs): 0 10*3/uL (ref 0.0–0.1)
Immature Granulocytes: 0 %
Lymphocytes Absolute: 1.7 10*3/uL (ref 0.7–3.1)
Lymphs: 25 %
MCH: 28.5 pg (ref 26.6–33.0)
MCHC: 32.6 g/dL (ref 31.5–35.7)
MCV: 87 fL (ref 79–97)
Monocytes Absolute: 0.3 10*3/uL (ref 0.1–0.9)
Monocytes: 5 %
Neutrophils Absolute: 4.4 10*3/uL (ref 1.4–7.0)
Neutrophils: 65 %
Platelets: 286 10*3/uL (ref 150–450)
RBC: 4.6 x10E6/uL (ref 3.77–5.28)
RDW: 12.9 % (ref 11.7–15.4)
WBC: 6.8 10*3/uL (ref 3.4–10.8)

## 2022-12-28 LAB — CMP14+EGFR
ALT: 20 IU/L (ref 0–32)
AST: 19 IU/L (ref 0–40)
Albumin: 4.5 g/dL (ref 3.9–4.9)
Alkaline Phosphatase: 91 IU/L (ref 44–121)
BUN/Creatinine Ratio: 20 (ref 12–28)
BUN: 13 mg/dL (ref 8–27)
Bilirubin Total: 0.5 mg/dL (ref 0.0–1.2)
CO2: 20 mmol/L (ref 20–29)
Calcium: 10 mg/dL (ref 8.7–10.3)
Chloride: 104 mmol/L (ref 96–106)
Creatinine, Ser: 0.66 mg/dL (ref 0.57–1.00)
Globulin, Total: 2.5 g/dL (ref 1.5–4.5)
Glucose: 113 mg/dL — ABNORMAL HIGH (ref 70–99)
Potassium: 4.4 mmol/L (ref 3.5–5.2)
Sodium: 138 mmol/L (ref 134–144)
Total Protein: 7 g/dL (ref 6.0–8.5)
eGFR: 95 mL/min/{1.73_m2} (ref >59)

## 2022-12-28 LAB — TSH+FREE T4
Free T4: 1.38 ng/dL (ref 0.82–1.77)
TSH: 3.88 u[IU]/mL (ref 0.450–4.500)

## 2022-12-28 LAB — LIPID PANEL
Chol/HDL Ratio: 2.5 ratio (ref 0.0–4.4)
Cholesterol, Total: 140 mg/dL (ref 100–199)
HDL: 55 mg/dL (ref >39)
LDL Chol Calc (NIH): 69 mg/dL (ref 0–99)
Triglycerides: 81 mg/dL (ref 0–149)
VLDL Cholesterol Cal: 16 mg/dL (ref 5–40)

## 2023-01-01 LAB — SPECIMEN STATUS REPORT

## 2023-01-01 LAB — HGB A1C W/O EAG: Hgb A1c MFr Bld: 6 % — ABNORMAL HIGH (ref 4.8–5.6)

## 2023-01-02 ENCOUNTER — Other Ambulatory Visit: Payer: Medicare Other

## 2023-01-02 ENCOUNTER — Ambulatory Visit: Payer: Medicare Other | Admitting: Family Medicine

## 2023-01-14 ENCOUNTER — Other Ambulatory Visit: Payer: Self-pay | Admitting: Family Medicine

## 2023-01-14 DIAGNOSIS — E890 Postprocedural hypothyroidism: Secondary | ICD-10-CM

## 2023-02-12 ENCOUNTER — Other Ambulatory Visit: Payer: Medicare Other

## 2023-02-12 ENCOUNTER — Encounter: Payer: Medicare Other | Admitting: Family Medicine

## 2023-02-18 ENCOUNTER — Ambulatory Visit (INDEPENDENT_AMBULATORY_CARE_PROVIDER_SITE_OTHER): Payer: Medicare Other

## 2023-02-22 ENCOUNTER — Other Ambulatory Visit: Payer: Self-pay | Admitting: Family Medicine

## 2023-02-22 DIAGNOSIS — E890 Postprocedural hypothyroidism: Secondary | ICD-10-CM

## 2023-03-27 ENCOUNTER — Ambulatory Visit: Payer: Medicare Other

## 2023-03-27 VITALS — Ht 65.0 in | Wt 174.0 lb

## 2023-03-27 DIAGNOSIS — Z780 Asymptomatic menopausal state: Secondary | ICD-10-CM | POA: Diagnosis not present

## 2023-03-27 DIAGNOSIS — Z0000 Encounter for general adult medical examination without abnormal findings: Secondary | ICD-10-CM

## 2023-03-27 NOTE — Patient Instructions (Signed)
Ms. Lovel , Thank you for taking time to come for your Medicare Wellness Visit. I appreciate your ongoing commitment to your health goals. Please review the following plan we discussed and let me know if I can assist you in the future.   Referrals/Orders/Follow-Ups/Clinician Recommendations: Aim for 30 minutes of exercise or brisk walking, 6-8 glasses of water, and 5 servings of fruits and vegetables each day.  This is a list of the screening recommended for you and due dates:  Health Maintenance  Topic Date Due   Zoster (Shingles) Vaccine (1 of 2) Never done   Colon Cancer Screening  Never done   Pneumonia Vaccine (2 of 2 - PPSV23 or PCV20) 10/18/2020   DEXA scan (bone density measurement)  10/22/2022   Flu Shot  11/22/2022   COVID-19 Vaccine (4 - 2023-24 season) 12/23/2022   Mammogram  07/23/2023   Medicare Annual Wellness Visit  03/26/2024   DTaP/Tdap/Td vaccine (2 - Td or Tdap) 02/24/2029   HPV Vaccine  Aged Out   Hepatitis C Screening  Discontinued    Advanced directives: (ACP Link)Information on Advanced Care Planning can be found at [facility] [place] Advance Health Care Directives Advance Health Care Directives ([email or web address]   Next Medicare Annual Wellness Visit scheduled for next year: Yes

## 2023-03-27 NOTE — Progress Notes (Signed)
Subjective:   Courtney White is a 67 y.o. female who presents for Medicare Annual (Subsequent) preventive examination.  Visit Complete: Virtual I connected with  Courtney White on 03/27/23 by a audio enabled telemedicine application and verified that I am speaking with the correct person using two identifiers.  Patient Location: Home  Provider Location: Home Office  I discussed the limitations of evaluation and management by telemedicine. The patient expressed understanding and agreed to proceed.  Vital Signs: Because this visit was a virtual/telehealth visit, some criteria may be missing or patient reported. Any vitals not documented were not able to be obtained and vitals that have been documented are patient reported.  Cardiac Risk Factors include: advanced age (>59men, >99 women);hypertension     Objective:    Today's Vitals   03/27/23 0952  Weight: 174 lb (78.9 kg)  Height: 5\' 5"  (1.651 m)   Body mass index is 28.96 kg/m.     03/27/2023   10:12 AM 06/26/2022    6:08 AM 03/22/2022    8:21 AM 12/16/2020    7:34 AM 12/08/2020    3:50 PM 11/02/2020    7:48 AM 10/12/2020    2:09 PM  Advanced Directives  Does Patient Have a Medical Advance Directive? No Yes Yes Yes Yes Yes Yes  Type of Advance Directive  Living will;Healthcare Power of [employer] Power of [place];Living will Healthcare Power of [place];Living will Healthcare Power of [place];Living will Living will;Healthcare Power of Attorney Living will;Healthcare Power of Attorney  Does patient want to make changes to medical advance directive?    No - Patient declined  No - Patient declined   Copy of Healthcare Power of Attorney in Chart?  No - copy requested No - copy requested No - copy requested  No - copy requested No - copy requested  Would patient like information on creating a medical advance directive? Yes (MAU/Ambulatory/Procedural Areas - Information given)   No - Patient declined       Current  Medications (verified) Outpatient Encounter Medications as of 03/27/2023  Medication Sig   acetaminophen (TYLENOL) 325 MG tablet Take 650 mg by mouth every 6 (six) hours as needed for moderate pain.   amLODipine (NORVASC) 5 MG tablet Take 1 tablet (5 mg total) by mouth daily.   aspirin EC 81 MG tablet Take 1 tablet (81 mg total) by mouth daily. Swallow whole.   Carboxymethylcellul-Glycerin (LUBRICATING EYE DROPS OP) Place 1 drop into both eyes daily as needed (dry eyes).   diclofenac Sodium (VOLTAREN) 1 % GEL Apply 1 Application topically 3 (three) times daily as needed (joint pain).   Elderberry 500 MG CAPS Take by mouth.   fluticasone (FLONASE) 50 MCG/ACT nasal spray Place 2 sprays into both nostrils daily.   ibuprofen (ADVIL) 200 MG tablet Take 400 mg by mouth every 8 (eight) hours as needed (pain).   levothyroxine (SYNTHROID) 112 MCG tablet TAKE 1 TABLET BY MOUTH EVERY DAY   metoprolol succinate (TOPROL-XL) 25 MG 24 hr tablet Take 1 tablet (25 mg total) by mouth daily. Take with or immediately following a meal.   Ospemifene (OSPHENA) 60 MG TABS Take 1 tablet (60 mg total) by mouth daily.   rosuvastatin (CRESTOR) 10 MG tablet TAKE 1 TABLET BY MOUTH EVERY DAY   Semaglutide-Weight Management (WEGOVY) 0.25 MG/0.5ML SOAJ Inject 0.25 mg into the skin every 7 (seven) days. Month #1   Semaglutide-Weight Management (WEGOVY) 0.5 MG/0.5ML SOAJ Inject 0.5 mg into the skin every 7 (seven)  days. Month #2-4   Vitamin D, Ergocalciferol, (DRISDOL) 1.25 MG (50000 UNIT) CAPS capsule TAKE 1 CAPSULE (50,000 UNITS TOTAL) BY MOUTH EVERY MONDAY.   nitroGLYCERIN (NITROSTAT) 0.4 MG SL tablet Place 1 tablet (0.4 mg total) under the tongue every 5 (five) minutes as needed for chest pain.   No facility-administered encounter medications on file as of 03/27/2023.    Allergies (verified) Patient has no known allergies.   History: Past Medical History:  Diagnosis Date   COVID-19 12/02/2020   Hepatitis C 1992   s/p  treatment.  she has cleared virus   HTN (hypertension)    Thyroid disease    Vitiligo    Past Surgical History:  Procedure Laterality Date   CORONARY PRESSURE/FFR STUDY N/A 06/26/2022   Procedure: INTRAVASCULAR PRESSURE WIRE/FFR STUDY;  Surgeon: Elder Negus, MD;  Location: [facility];  Service: Cardiovascular;  Laterality: N/A;   LEFT HEART CATH AND CORONARY ANGIOGRAPHY N/A 06/26/2022   Procedure: LEFT HEART CATH AND CORONARY ANGIOGRAPHY;  Surgeon: Elder Negus, MD;  Location: [facility];  Service: Cardiovascular;  Laterality: N/A;   THYROIDECTOMY Right 12/16/2020   Procedure: HEMI THYROIDECTOMY;  Surgeon: Newman Pies, MD;  Location: [facility];  Service: ENT;  Laterality: Right;   TONSILLECTOMY AND ADENOIDECTOMY     UTERINE FIBROID SURGERY     Family History  Problem Relation Age of Onset   Diabetes Mother    Depression Mother    Hypertension Mother    Lung cancer Father    Hypertension Sister    Hypertension Sister    Thyroid disease Maternal Aunt    Social History   Socioeconomic History   Marital status: Significant Other    Spouse name: Not on file   Number of children: 0   Years of education: Not on file   Highest education level: Not on file  Occupational History   Not on file  Tobacco Use   Smoking status: Never   Smokeless tobacco: Never  Vaping Use   Vaping status: Never Used  Substance and Sexual Activity   Alcohol use: Yes    Comment: occ   Drug use: Not Currently   Sexual activity: Yes    Birth control/protection: Post-menopausal  Other Topics Concern   Not on file  Social History Narrative   Patient is a retired Arts development officer.  She is a widower and notes that she moved from the [place] area after being reunited with a high school sweetheart whom she is now engaged to.   She lives locally in [place].   No children.   Social Determinants of Health   Financial Resource Strain: Low Risk  (03/27/2023)    Overall Financial Resource Strain (CARDIA)    Difficulty of Paying Living Expenses: Not hard at all  Food Insecurity: No Food Insecurity (03/27/2023)   Hunger Vital Sign    Worried About Running Out of Food in the Last Year: Never true    Ran Out of Food in the Last Year: Never true  Transportation Needs: No Transportation Needs (03/27/2023)   PRAPARE - Administrator, Civil Service (Medical): No    Lack of Transportation (Non-Medical): No  Physical Activity: Insufficiently Active (03/27/2023)   Exercise Vital Sign    Days of Exercise per Week: 3 days    Minutes of Exercise per Session: 30 min  Stress: No Stress Concern Present (03/27/2023)   [employer] of Occupational Health - Occupational Stress Questionnaire  Feeling of Stress : Not at all  Social Connections: Socially Integrated (03/27/2023)   Social Connection and Isolation Panel [NHANES]    Frequency of Communication with Friends and Family: More than three times a week    Frequency of Social Gatherings with Friends and Family: Three times a week    Attends Religious Services: More than 4 times per year    Active Member of Clubs or Organizations: Yes    Attends Engineer, structural: More than 4 times per year    Marital Status: Living with partner    Tobacco Counseling Counseling given: Not Answered   Clinical Intake:  Pre-visit preparation completed: Yes  Pain : No/denies pain     Diabetes: No  How often do you need to have someone help you when you read instructions, pamphlets, or other written materials from your doctor or pharmacy?: 1 - Never  Interpreter Needed?: No  Information entered by :: Kandis Fantasia LPN   Activities of Daily Living    03/27/2023    9:57 AM  In your present state of health, do you have any difficulty performing the following activities:  Hearing? 0  Vision? 0  Difficulty concentrating or making decisions? 0  Walking or climbing stairs? 0  Dressing or  bathing? 0  Doing errands, shopping? 0  Preparing Food and eating ? N  Using the Toilet? N  In the past six months, have you accidently leaked urine? N  Do you have problems with loss of bowel control? N  Managing your Medications? N  Managing your Finances? N  Housekeeping or managing your Housekeeping? N    Patient Care Team: Raliegh Ip, DO as PCP - General (Family Medicine) Elder Negus, MD as Consulting Physician (Cardiology)  Indicate any recent Medical Services you may have received from other than [facility] providers in the past year (date may be approximate).     Assessment:   This is a routine wellness examination for Courtney White.  Hearing/Vision screen Hearing Screening - Comments:: Denies hearing difficulties   Vision Screening - Comments:: Wears rx glasses - up to date with routine eye exams with [facility].     Goals Addressed   None   Depression Screen    03/27/2023    9:56 AM 10/05/2022    1:25 PM 03/22/2022    8:19 AM 01/08/2022   10:09 AM 01/08/2022    9:52 AM 07/07/2021   11:02 AM 12/30/2020    3:28 PM  PHQ 2/9 Scores  PHQ - 2 Score 0 0 0 0 0 0 0  PHQ- 9 Score  0    1     Fall Risk    03/27/2023    9:57 AM 10/05/2022    1:39 PM 10/05/2022    1:23 PM 03/20/2022    5:52 PM 01/08/2022   10:09 AM  Fall Risk   Falls in the past year? 0 0 0 0 0  Number falls in past yr: 0  0 0   Injury with Fall? 0  0 1   Risk for fall due to : No Fall Risks  No Fall Risks    Follow up Falls prevention discussed;Education provided;Falls evaluation completed  Education provided      MEDICARE RISK AT HOME: Medicare Risk at Home Any stairs in or around the home?: No If so, are there any without handrails?: No Home free of loose throw rugs in walkways, pet beds, electrical cords, etc?: Yes Adequate lighting in your home  to reduce risk of falls?: Yes Life alert?: No Use of a cane, walker or w/c?: No Grab bars in the bathroom?: Yes Shower chair or bench in shower?:  No Elevated toilet seat or a handicapped toilet?: Yes  TIMED UP AND GO:  Was the test performed?  No    Cognitive Function:        03/27/2023    9:58 AM 03/22/2022    8:21 AM 10/12/2020    2:10 PM  6CIT Screen  What Year? 0 points 0 points 0 points  What month? 0 points 0 points 0 points  What time? 0 points 0 points 0 points  Count back from 20 0 points 0 points 0 points  Months in reverse 0 points 0 points 0 points  Repeat phrase 0 points 0 points 0 points  Total Score 0 points 0 points 0 points    Immunizations Immunization History  Administered Date(s) Administered   Fluad Quad(high Dose 65+) 02/20/2021, 01/08/2022   Influenza,inj,Quad PF,6+ Mos 01/23/2018   Influenza,inj,Quad PF,6-35 Mos 12/18/2018   Influenza-Unspecified 01/23/2018, 04/03/2020   Moderna Sars-Covid-2 Vaccination 06/18/2019, 07/17/2019, 04/03/2020   Pneumococcal Conjugate-13 10/19/2019   Tdap 02/25/2019    TDAP status: Up to date  Flu Vaccine status: Up to date  Pneumococcal vaccine status: Due, Education has been provided regarding the importance of this vaccine. Advised may receive this vaccine at local pharmacy or Health Dept. Aware to provide a copy of the vaccination record if obtained from local pharmacy or Health Dept. Verbalized acceptance and understanding.  Covid-19 vaccine status: Information provided on how to obtain vaccines.   Qualifies for Shingles Vaccine? No    Screening Tests Health Maintenance  Topic Date Due   Zoster Vaccines- Shingrix (1 of 2) Never done   Colonoscopy  Never done   Pneumonia Vaccine 40+ Years old (2 of 2 - PPSV23 or PCV20) 10/18/2020   DEXA SCAN  10/22/2022   INFLUENZA VACCINE  11/22/2022   COVID-19 Vaccine (4 - 2023-24 season) 12/23/2022   MAMMOGRAM  07/23/2023   Medicare Annual Wellness (AWV)  03/26/2024   DTaP/Tdap/Td (2 - Td or Tdap) 02/24/2029   HPV VACCINES  Aged Out   Hepatitis C Screening  Discontinued    Health Maintenance  Health  Maintenance Due  Topic Date Due   Zoster Vaccines- Shingrix (1 of 2) Never done   Colonoscopy  Never done   Pneumonia Vaccine 46+ Years old (2 of 2 - PPSV23 or PCV20) 10/18/2020   DEXA SCAN  10/22/2022   INFLUENZA VACCINE  11/22/2022   COVID-19 Vaccine (4 - 2023-24 season) 12/23/2022    Colorectal cancer screening: Type of screening: Colonoscopy. Completed 12/16/14. Repeat every 10 years  Mammogram status: Completed 07/23/22. Repeat every year  Bone Density status: Ordered and scheduled for 07/26/23. Pt provided with contact info and advised to call to schedule appt.  Lung Cancer Screening: (Low Dose CT Chest recommended if Age 8-80 years, 20 pack-year currently smoking OR have quit w/in 15years.) does not qualify.   Lung Cancer Screening Referral: n/a  Additional Screening:  Hepatitis C Screening: does qualify (past history)  Vision Screening: Recommended annual ophthalmology exams for early detection of glaucoma and other disorders of the eye. Is the patient up to date with their annual eye exam?  No  Who is the provider or what is the name of the office in which the patient attends annual eye exams? none If pt is not established with a provider, would they like to  be referred to a provider to establish care? No .   Dental Screening: Recommended annual dental exams for proper oral hygiene  Community Resource Referral / Chronic Care Management: CRR required this visit?  No   CCM required this visit?  No     Plan:     I have personally reviewed and noted the following in the patient's chart:   Medical and social history Use of alcohol, tobacco or illicit drugs  Current medications and supplements including opioid prescriptions. Patient is not currently taking opioid prescriptions. Functional ability and status Nutritional status Physical activity Advanced directives List of other physicians Hospitalizations, surgeries, and ER visits in previous 12  months Vitals Screenings to include cognitive, depression, and falls Referrals and appointments  In addition, I have reviewed and discussed with patient certain preventive protocols, quality metrics, and best practice recommendations. A written personalized care plan for preventive services as well as general preventive health recommendations were provided to patient.     Kandis Fantasia [place], [place]   [id number]/12/[id number]   After Visit Summary: (MyChart) Due to this being a telephonic visit, the after visit summary with patients personalized plan was offered to patient via MyChart   Nurse Notes: No concerns at this time

## 2023-05-15 NOTE — Telephone Encounter (Signed)
Drug not covered

## 2023-05-21 ENCOUNTER — Other Ambulatory Visit: Payer: Self-pay | Admitting: Family Medicine

## 2023-05-21 DIAGNOSIS — E559 Vitamin D deficiency, unspecified: Secondary | ICD-10-CM

## 2023-05-22 NOTE — Telephone Encounter (Signed)
Sent, please order vit d level when she comes for DEXA in april

## 2023-05-22 NOTE — Addendum Note (Signed)
Addended by: Julious Payer D on: 05/22/2023 01:06 PM   Modules accepted: Orders

## 2023-05-22 NOTE — Telephone Encounter (Signed)
LMOVM to have labwork done in April when she has her Dexa done, to call to make lab appt. Order placed.

## 2023-05-25 ENCOUNTER — Other Ambulatory Visit: Payer: Self-pay | Admitting: Cardiology

## 2023-05-25 DIAGNOSIS — R0609 Other forms of dyspnea: Secondary | ICD-10-CM

## 2023-05-25 DIAGNOSIS — R072 Precordial pain: Secondary | ICD-10-CM

## 2023-07-07 ENCOUNTER — Other Ambulatory Visit: Payer: Self-pay | Admitting: Family Medicine

## 2023-07-07 ENCOUNTER — Other Ambulatory Visit: Payer: Self-pay | Admitting: Cardiology

## 2023-07-07 DIAGNOSIS — R0609 Other forms of dyspnea: Secondary | ICD-10-CM

## 2023-07-07 DIAGNOSIS — R072 Precordial pain: Secondary | ICD-10-CM

## 2023-07-08 ENCOUNTER — Other Ambulatory Visit: Payer: Self-pay | Admitting: Cardiology

## 2023-07-08 NOTE — Telephone Encounter (Signed)
 Last Vit D level 01/08/2022 Last OV 10/05/2022 last filled 05/27/2023  Per las refill encounter pt will have Vit D checked with Dexa and has appt with Dr Reece Agar 07/26/2023, will send in refill

## 2023-07-25 ENCOUNTER — Ambulatory Visit: Payer: Medicare Other | Attending: Cardiology | Admitting: Cardiology

## 2023-07-25 ENCOUNTER — Encounter: Payer: Self-pay | Admitting: Cardiology

## 2023-07-25 VITALS — BP 138/90 | HR 81 | Resp 16 | Ht 65.0 in | Wt 178.6 lb

## 2023-07-25 DIAGNOSIS — R7303 Prediabetes: Secondary | ICD-10-CM | POA: Diagnosis not present

## 2023-07-25 DIAGNOSIS — R0609 Other forms of dyspnea: Secondary | ICD-10-CM | POA: Diagnosis not present

## 2023-07-25 DIAGNOSIS — I25118 Atherosclerotic heart disease of native coronary artery with other forms of angina pectoris: Secondary | ICD-10-CM | POA: Diagnosis not present

## 2023-07-25 MED ORDER — HYDROCHLOROTHIAZIDE 25 MG PO TABS: 12.5000 mg | ORAL_TABLET | Freq: Every day | ORAL | 3 refills | Status: AC

## 2023-07-25 NOTE — Progress Notes (Addendum)
 Cardiology Office Note:  .   Date:  07/25/2023  ID:  Courtney White, DOB 1955/07/18, MRN [id number] PCP: Eliodoro Guerin, DO  [facility] Providers Cardiologist:  Fransico Ivy, MD PCP: Eliodoro Guerin, DO  Chief Complaint  Patient presents with   Dyspnea on exertion   Follow-up     Courtney White is a 68 y.o. female with hypertension, hyperlipidemia, mild nonobstructive CAD, h/o partial thyroidectomy, parathyroidectomy  Patient is continue to have exertional dyspnea, and only occasional chest tightness.  She has not been able to increase her physical activity owing to her mother's illness.  She wonders if she can undergo cardiac rehab.  She also wonders if she would qualify for Wegovy .  On a separate, blood pressure is mildly elevated today.  Reviewed lab results from 12/2022, details below.    Vitals:   07/25/23 1250  BP: (!) 138/90  Pulse: 81  Resp: 16  SpO2: 94%      Review of Systems  Cardiovascular:  Positive for chest pain and dyspnea on exertion. Negative for leg swelling, palpitations and syncope.        Studies Reviewed: Courtney White        Independently interpreted 12/2022: Chol 140, TG 81, HDL 55, LDL 69 HbA1C 6.0% Hb 13.1 Cr 0.66 3.8  CPEX 07/2022: Normal functional capacity. At peak exercise, patient is limited by reaching her ventilatory limits. The VE/VCO2 slope is moderately elevated and may reflect elevated pulmonary pressures (diastolic dysfunction) particularly in the setting of a hypertensive response to exercise, however given the low PETCO2, there may also be a component of end-exercise hyperventilation contributing to the high slope.   Coronary angiography 06/26/2022: LM: Normal LAD: Ostial 20%, mid diffuse 40%, mid to distal diffuse 30% disease Lcx: Minimal luminal irregularities RCA: Minimal luminal irregularities   Resting Pd/Pa: 0.80 (Pullback performed after adenosine  effect wore off showed truly diffuse disease with  no focal step-up) FFR: 0.89 CFR: 3.1 IMR: 16      Resting Pd/Pa and FFR are both borderline abnormal.  However, microvascular indices are normal.  Pullback after adenosine  effect wore off showed truly diffuse disease in LAD with no focal stepup.   It is conceivable that she may have angina or angina equivalent symptoms from the amount of diffuse LAD disease as noted above.  However, there is no focal areas of severe stenoses that would benefit from stenting.  Moreover, I do not think a LIMA-LAD graft would mature given the borderline severe disease she has.  In addition to the coronary artery disease as described above, it is possible that there may be also component of deconditioning and obesity responsible for her dyspnea symptoms, as PFTs have been reportedly normal.   At this time, I recommend aggressive medical therapy for coronary artery disease, along with consideration for cardiopulmonary stress test, as well as cardiopulmonary rehab.   Unusually long procedure time due to severe tortuosity and spasm in the radial artery, requiring use of long stent which he   Physical Exam Vitals and nursing note reviewed.  Constitutional:      General: She is not in acute distress. Neck:     Vascular: No JVD.  Cardiovascular:     Rate and Rhythm: Normal rate and regular rhythm.     Heart sounds: Normal heart sounds. No murmur heard. Pulmonary:     Effort: Pulmonary effort is normal.     Breath sounds: Normal breath sounds. No wheezing or rales.  Musculoskeletal:  Right lower leg: No edema.     Left lower leg: No edema.      VISIT DIAGNOSES:   ICD-10-CM   1. Dyspnea on exertion  R06.09 EKG 12-Lead    NM PET CT CARDIAC PERFUSION MULTI W/ABSOLUTE BLOODFLOW    Amb Referral to Cardiac Rehabilitation    Cardiac Stress Test: Informed Consent Details: Physician/Practitioner Attestation; Transcribe to consent form and obtain patient signature    2. Coronary artery disease of native  artery of native heart with stable angina pectoris (HCC)  I25.118 AMB Referral to [facility] Pharm-D    NM PET CT CARDIAC PERFUSION MULTI W/ABSOLUTE BLOODFLOW    Amb Referral to Cardiac Rehabilitation    Cardiac Stress Test: Informed Consent Details: Physician/Practitioner Attestation; Transcribe to consent form and obtain patient signature    3. Prediabetes  R73.03 AMB Referral to [facility] Pharm-D       Courtney White is a 68 y.o. female with hypertension, hyperlipidemia, mild nonobstructive CAD, h/o partial thyroidectomy, parathyroidectomy Assessment & Plan  CAD: No significant obstructive CAD on cath in 06/2023. iFR and FFR for both borderline in spite of no focal epicardial disease. Her symptoms of dyspnea on exertion and mild chest tightness have persisted.  Pulmonary evaluation has been unremarkable. Recommend PET/CT stress test for objective evidence of ischemia.  If ischemia noted, could consider repeat heart catheterization.  If no ischemia noted, I think she would benefit from cardiac rehabilitation due to persistent symptoms. Given prediabetes, CAD, and preobesity, I will refer to Pharm.D. for consideration of Wegovy . Continue Aspirin  81 mg daily, rosuvastatin  5 mg daily, metoprolol  succinate 25 mg daily, amlodipine  5 mg daily, prn nitro.    Hypertension: Improving.   Mixed hyperlipidemia: Chol 140, TG 81, HDL 55, LDL 69 (12/2022) Continue Crestor  10 mg.   Informed Consent   Shared Decision Making/Informed Consent The risks [chest pain, shortness of breath, cardiac arrhythmias, dizziness, blood pressure fluctuations, myocardial infarction, stroke/transient ischemic attack, nausea, vomiting, allergic reaction, radiation exposure, metallic taste sensation and life-threatening complications (estimated to be 1 in 10,000)], benefits (risk stratification, diagnosing coronary artery disease, treatment guidance) and alternatives of a cardiac PET stress test were discussed in detail  with Ms. Truax and she agrees to proceed.       Meds ordered this encounter  Medications   hydrochlorothiazide  (HYDRODIURIL ) 25 MG tablet    Sig: Take 0.5 tablets (12.5 mg total) by mouth daily.    Dispense:  45 tablet    Refill:  3     F/u in 6 months  Signed, Cody Das, MD

## 2023-07-25 NOTE — Patient Instructions (Addendum)
 Medication Instructions:  START hydrochlorothiazide 12.5 MG   *If you need a refill on your cardiac medications before your next appointment, please call your pharmacy*  Testing/Procedures: PET/CT    Follow-Up: At [facility], you and your health needs are our priority.  As part of our continuing mission to provide you with exceptional heart care, our providers are all part of one team.  This team includes your primary Cardiologist (physician) and Advanced Practice Providers or APPs (Physician Assistants and Nurse Practitioners) who all work together to provide you with the care you need, when you need it.  Your next appointment:   6 month(s)  Provider:   Elder Negus, MD      Other Instructions    Please report to Radiology at the [facility] Main Entrance 30 minutes early for your test.  [address] [place], [place] [address]                         OR   Please report to Radiology at [facility] Main Entrance, medical mall, 30 mins prior to your test.  [address]  [place], [place]  How to Prepare for Your Cardiac PET/CT Stress Test:  Nothing to eat or drink, except water, 3 hours prior to arrival time.  NO caffeine/decaffeinated products, or chocolate 12 hours prior to arrival. (Please note decaffeinated beverages (teas/coffees) still contain caffeine).  If you have caffeine within 12 hours prior, the test will need to be rescheduled.  Medication instructions: Do not take erectile dysfunction medications for 72 hours prior to test (sildenafil, tadalafil) Do not take nitrates (isosorbide mononitrate, Ranexa) the day before or day of test Do not take tamsulosin the day before or morning of test Hold theophylline containing medications for 12 hours. Hold Dipyridamole 48 hours prior to the test.  Diabetic Preparation: If able to eat breakfast prior to 3 hour fasting, you may take all medications, including your  insulin. Do not worry if you miss your breakfast dose of insulin - start at your next meal. If you do not eat prior to 3 hour fast-Hold all diabetes (oral and insulin) medications. Patients who wear a continuous glucose monitor MUST remove the device prior to scanning.  You may take your remaining medications with water.  NO perfume, cologne or lotion on chest or abdomen area. FEMALES - Please avoid wearing dresses to this appointment.  Total time is 1 to 2 hours; you may want to bring reading material for the waiting time.  IF YOU THINK YOU MAY BE PREGNANT, OR ARE NURSING PLEASE INFORM THE TECHNOLOGIST.  In preparation for your appointment, medication and supplies will be purchased.  Appointment availability is limited, so if you need to cancel or reschedule, please call the Radiology Department Scheduler at [phone] 24 hours in advance to avoid a cancellation fee of $100.00  What to Expect When you Arrive:  Once you arrive and check in for your appointment, you will be taken to a preparation room within the Radiology Department.  A technologist or Nurse will obtain your medical history, verify that you are correctly prepped for the exam, and explain the procedure.  Afterwards, an IV will be started in your arm and electrodes will be placed on your skin for EKG monitoring during the stress portion of the exam. Then you will be escorted to the PET/CT scanner.  There, staff will get you positioned on the scanner and  obtain a blood pressure and EKG.  During the exam, you will continue to be connected to the EKG and blood pressure machines.  A small, safe amount of a radioactive tracer will be injected in your IV to obtain a series of pictures of your heart along with an injection of a stress agent.    After your Exam:  It is recommended that you eat a meal and drink a caffeinated beverage to counter act any effects of the stress agent.  Drink plenty of fluids for the remainder of the day and  urinate frequently for the first couple of hours after the exam.  Your doctor will inform you of your test results within 7-10 business days.  For more information and frequently asked questions, please visit our website: [email or web address]  For questions about your test or how to prepare for your test, please call: Cardiac Imaging Nurse Navigators Office: [phone]        1st Floor: - Lobby - Registration  - Pharmacy  - Lab - Cafe  2nd Floor: - PV Lab - Diagnostic Testing (echo, CT, nuclear med)  3rd Floor: - Vacant  4th Floor: - TCTS (cardiothoracic surgery) - AFib Clinic - Structural Heart Clinic - Vascular Surgery  - Vascular Ultrasound  5th Floor: - HeartCare Cardiology (general and EP) - Clinical Pharmacy for coumadin, hypertension, lipid, weight-loss medications, and med management appointments    Valet parking services will be available as well.

## 2023-07-26 ENCOUNTER — Encounter: Payer: Self-pay | Admitting: Family Medicine

## 2023-07-26 ENCOUNTER — Ambulatory Visit (INDEPENDENT_AMBULATORY_CARE_PROVIDER_SITE_OTHER): Payer: Medicare Other | Admitting: Family Medicine

## 2023-07-26 ENCOUNTER — Other Ambulatory Visit: Payer: Medicare Other

## 2023-07-26 VITALS — BP 121/76 | HR 89 | Temp 98.7°F | Ht 65.0 in | Wt 177.4 lb

## 2023-07-26 DIAGNOSIS — M810 Age-related osteoporosis without current pathological fracture: Secondary | ICD-10-CM

## 2023-07-26 DIAGNOSIS — Z0000 Encounter for general adult medical examination without abnormal findings: Secondary | ICD-10-CM

## 2023-07-26 DIAGNOSIS — I25118 Atherosclerotic heart disease of native coronary artery with other forms of angina pectoris: Secondary | ICD-10-CM | POA: Diagnosis not present

## 2023-07-26 DIAGNOSIS — Z0001 Encounter for general adult medical examination with abnormal findings: Secondary | ICD-10-CM | POA: Diagnosis not present

## 2023-07-26 DIAGNOSIS — R7303 Prediabetes: Secondary | ICD-10-CM

## 2023-07-26 DIAGNOSIS — E782 Mixed hyperlipidemia: Secondary | ICD-10-CM

## 2023-07-26 DIAGNOSIS — I10 Essential (primary) hypertension: Secondary | ICD-10-CM | POA: Diagnosis not present

## 2023-07-26 DIAGNOSIS — Z636 Dependent relative needing care at home: Secondary | ICD-10-CM

## 2023-07-26 DIAGNOSIS — Z1159 Encounter for screening for other viral diseases: Secondary | ICD-10-CM

## 2023-07-26 DIAGNOSIS — N898 Other specified noninflammatory disorders of vagina: Secondary | ICD-10-CM

## 2023-07-26 DIAGNOSIS — E890 Postprocedural hypothyroidism: Secondary | ICD-10-CM

## 2023-07-26 LAB — BAYER DCA HB A1C WAIVED: HB A1C (BAYER DCA - WAIVED): 6.2 % — ABNORMAL HIGH (ref 4.8–5.6)

## 2023-07-26 MED ORDER — OSPHENA 60 MG PO TABS: 1.0000 | ORAL_TABLET | Freq: Every day | ORAL | 3 refills | Status: DC

## 2023-07-26 MED ORDER — METOPROLOL SUCCINATE ER 25 MG PO TB24: 25.0000 mg | ORAL_TABLET | Freq: Every day | ORAL | 3 refills | Status: AC

## 2023-07-26 MED ORDER — AMLODIPINE BESYLATE 5 MG PO TABS: 5.0000 mg | ORAL_TABLET | Freq: Every day | ORAL | 3 refills | Status: AC

## 2023-07-26 MED ORDER — ROSUVASTATIN CALCIUM 10 MG PO TABS: 10.0000 mg | ORAL_TABLET | Freq: Every day | ORAL | 3 refills | Status: AC

## 2023-07-26 MED ORDER — NITROGLYCERIN 0.4 MG SL SUBL: 0.4000 mg | SUBLINGUAL_TABLET | SUBLINGUAL | 3 refills | Status: DC | PRN

## 2023-07-26 MED ORDER — LEVOTHYROXINE SODIUM 112 MCG PO TABS: 112.0000 ug | ORAL_TABLET | Freq: Every day | ORAL | 3 refills | Status: DC

## 2023-07-26 NOTE — Progress Notes (Signed)
 Courtney White is a 68 y.o. female presents to office today for annual physical exam examination.    Concerns today include: 1.  Caregiver stress She does report some caregiver stress.  Since December, her mother, who suffers from dementia, has moved in with her and her spouse Ray.  It has been a bit of a challenge getting used to navigating the complications that are associated with memory fluctuation.  She feels blessed that she has the opportunity to care for her mother however and they are trying their best to make life easier for her mom.  She [place] to try and get their rental property ready for the season.  Occupation: retired, Marital status: married, Substance use: none Health Maintenance Due  Topic Date Due   Zoster Vaccines- Shingrix (1 of 2) Never done   Colonoscopy  Never done   COVID-19 Vaccine (4 - 2024-25 season) 12/23/2022   Hepatitis C Screening  07/26/2023   Refills needed today: all  Immunization History  Administered Date(s) Administered   Fluad Quad(high Dose 65+) 02/20/2021, 01/08/2022   Influenza,inj,Quad PF,6+ Mos 01/23/2018   Influenza,inj,Quad PF,6-35 Mos 12/18/2018   Influenza-Unspecified 01/23/2018, 04/03/2020   Moderna Sars-Covid-2 Vaccination 06/18/2019, 07/17/2019, 04/03/2020   Pneumococcal Conjugate-13 10/19/2019   Tdap 02/25/2019   Past Medical History:  Diagnosis Date   COVID-19 12/02/2020   Hepatitis C 1992   s/p treatment.  she has cleared virus   HTN (hypertension)    Thyroid disease    Vitiligo    Social History   Socioeconomic History   Marital status: Significant Other    Spouse name: Not on file   Number of children: 0   Years of education: Not on file   Highest education level: Not on file  Occupational History   Not on file  Tobacco Use   Smoking status: Never   Smokeless tobacco: Never  Vaping Use   Vaping status: Never Used  Substance and Sexual Activity    Alcohol use: Yes    Comment: occ   Drug use: Not Currently   Sexual activity: Yes    Birth control/protection: Post-menopausal  Other Topics Concern   Not on file  Social History Narrative   Patient is a retired Arts development officer.  She is a widower and notes that she moved from the [place] area after being reunited with a high school sweetheart whom she is now engaged to.   She lives locally in [place].   No children.   Social Drivers of Corporate investment banker Strain: Low Risk  (07/26/2023)   Overall Financial Resource Strain (CARDIA)    Difficulty of Paying Living Expenses: Not very hard  Food Insecurity: No Food Insecurity (07/26/2023)   Hunger Vital Sign    Worried About Running Out of Food in the Last Year: Never true    Ran Out of Food in the Last Year: Never true  Transportation Needs: No Transportation Needs (07/26/2023)   PRAPARE - Administrator, Civil Service (Medical): No    Lack of Transportation (Non-Medical): No  Physical Activity: Insufficiently Active (07/26/2023)   Exercise Vital Sign    Days of Exercise per Week: 3 days    Minutes of Exercise per Session: 30 min  Stress: No Stress Concern Present (07/26/2023)   [employer] of Occupational Health - Occupational Stress Questionnaire    Feeling of Stress : Not at all  Social Connections:  Socially Integrated (07/26/2023)   Social Connection and Isolation Panel [NHANES]    Frequency of Communication with Friends and Family: More than three times a week    Frequency of Social Gatherings with Friends and Family: Three times a week    Attends Religious Services: More than 4 times per year    Active Member of Clubs or Organizations: Yes    Attends Banker Meetings: More than 4 times per year    Marital Status: Living with partner  Intimate Partner Violence: Not At Risk (07/26/2023)   Humiliation, Afraid, Rape, and Kick questionnaire    Fear of Current or Ex-Partner: No    Emotionally  Abused: No    Physically Abused: No    Sexually Abused: No   Past Surgical History:  Procedure Laterality Date   CORONARY PRESSURE/FFR STUDY N/A 06/26/2022   Procedure: INTRAVASCULAR PRESSURE WIRE/FFR STUDY;  Surgeon: Elder Negus, MD;  Location: [facility] INVASIVE CV LAB;  Service: Cardiovascular;  Laterality: N/A;   LEFT HEART CATH AND CORONARY ANGIOGRAPHY N/A 06/26/2022   Procedure: LEFT HEART CATH AND CORONARY ANGIOGRAPHY;  Surgeon: Elder Negus, MD;  Location: [facility] INVASIVE CV LAB;  Service: Cardiovascular;  Laterality: N/A;   THYROIDECTOMY Right 12/16/2020   Procedure: HEMI THYROIDECTOMY;  Surgeon: Newman Pies, MD;  Location: [facility];  Service: ENT;  Laterality: Right;   TONSILLECTOMY AND ADENOIDECTOMY     UTERINE FIBROID SURGERY     Family History  Problem Relation Age of Onset   Diabetes Mother    Depression Mother    Hypertension Mother    Lung cancer Father    Hypertension Sister    Hypertension Sister    Thyroid disease Maternal Aunt     Current Outpatient Medications:    acetaminophen (TYLENOL) 325 MG tablet, Take 650 mg by mouth every 6 (six) hours as needed for moderate pain., Disp: , Rfl:    amLODipine (NORVASC) 5 MG tablet, Take 1 tablet (5 mg total) by mouth daily., Disp: 90 tablet, Rfl: 0   aspirin EC (ASPIRIN LOW DOSE) 81 MG tablet, TAKE 1 TABLET (81 MG TOTAL) BY MOUTH DAILY. SWALLOW WHOLE., Disp: 90 tablet, Rfl: 1   Carboxymethylcellul-Glycerin (LUBRICATING EYE DROPS OP), Place 1 drop into both eyes daily as needed (dry eyes)., Disp: , Rfl:    diclofenac Sodium (VOLTAREN) 1 % GEL, Apply 1 Application topically 3 (three) times daily as needed (joint pain)., Disp: , Rfl:    Elderberry 500 MG CAPS, Take by mouth., Disp: , Rfl:    hydrochlorothiazide (HYDRODIURIL) 25 MG tablet, Take 0.5 tablets (12.5 mg total) by mouth daily., Disp: 45 tablet, Rfl: 3   ibuprofen (ADVIL) 200 MG tablet, Take 400 mg by mouth every 8 (eight) hours as needed (pain).,  Disp: , Rfl:    levothyroxine (SYNTHROID) 112 MCG tablet, TAKE 1 TABLET BY MOUTH EVERY DAY, Disp: 90 tablet, Rfl: 3   metoprolol succinate (TOPROL-XL) 25 MG 24 hr tablet, TAKE 1 TABLET BY MOUTH DAILY. TAKE WITH OR IMMEDIATELY FOLLOWING A MEAL., Disp: 90 tablet, Rfl: 0   nitroGLYCERIN (NITROSTAT) 0.4 MG SL tablet, Place 1 tablet (0.4 mg total) under the tongue every 5 (five) minutes as needed for chest pain., Disp: 30 tablet, Rfl: 3   rosuvastatin (CRESTOR) 10 MG tablet, TAKE 1 TABLET BY MOUTH EVERY DAY, Disp: 90 tablet, Rfl: 1   Vitamin D, Ergocalciferol, (DRISDOL) 1.25 MG (50000 UNIT) CAPS capsule, TAKE 1 CAPSULE (50,000 UNITS TOTAL) BY MOUTH EVERY MONDAY., Disp:  12 capsule, Rfl: 3  No Known Allergies   ROS: Review of Systems A comprehensive review of systems was negative except for: Integument/breast: positive for skin tear of left forearm.  Her "grand dog" accidentally scratched her.  He is a new puppy and they are still treating him    Physical exam BP 121/76   Pulse 89   Temp 98.7 F (37.1 C)   Ht 5\' 5"  (1.651 m)   Wt 177 lb 6.4 oz (80.5 kg)   LMP  (LMP Unknown)   SpO2 95%   BMI 29.52 kg/m  General appearance: alert, cooperative, appears stated age, and no distress Head: Normocephalic, without obvious abnormality, atraumatic Eyes: negative findings: lids and lashes normal, conjunctivae and sclerae normal, corneas clear, and pupils equal, round, reactive to light and accomodation Ears: normal TM's and external ear canals both ears Nose: Nares normal. Septum midline. Mucosa normal. No drainage or sinus tenderness. Throat: lips, mucosa, and tongue normal; teeth and gums normal Neck: no adenopathy, supple, symmetrical, trachea midline, and thyroid not enlarged, symmetric, no tenderness/mass/nodules Back: symmetric, no curvature. ROM normal. No CVA tenderness. Lungs: clear to auscultation bilaterally Heart: regular rate and rhythm, S1, S2 normal, no murmur, click, rub or  gallop Abdomen: soft, non-tender; bowel sounds normal; no masses,  no organomegaly Extremities: extremities normal, atraumatic, no cyanosis or edema Pulses: 2+ and symmetric Skin:  Hemostatic bleed noted along the left forearm.  No evidence of secondary bacterial infection Lymph nodes: Cervical, supraclavicular, and axillary nodes normal. Neurologic: Grossly normal      07/26/2023   10:34 AM 03/27/2023    9:56 AM 10/05/2022    1:25 PM  Depression screen PHQ 2/9  Decreased Interest 0 0 0  Down, Depressed, Hopeless 0 0 0  PHQ - 2 Score 0 0 0  Altered sleeping 0  0  Tired, decreased energy 0  0  Change in appetite 0  0  Feeling bad or failure about yourself  0  0  Trouble concentrating 0  0  Moving slowly or fidgety/restless 0  0  Suicidal thoughts 0  0  PHQ-9 Score 0  0  Difficult doing work/chores Not difficult at all  Not difficult at all      07/26/2023   10:34 AM 10/05/2022    1:25 PM 01/08/2022   10:09 AM 07/07/2021   11:02 AM  GAD 7 : Generalized Anxiety Score  Nervous, Anxious, on Edge 0 0 0 0  Control/stop worrying 0 0 0 0  Worry too much - different things 0 0 0 0  Trouble relaxing 0 0 0 0  Restless 0 0 0 0  Easily annoyed or irritable 0 0 0 0  Afraid - awful might happen 0 0 0 0  Total GAD 7 Score 0 0 0 0  Anxiety Difficulty   Not difficult at all Not difficult at all     Assessment/ Plan: Towanda Octave Cisse here for annual physical exam.   Annual physical exam  Coronary artery disease involving native coronary artery of native heart with other form of angina pectoris (HCC) - Plan: amLODipine (NORVASC) 5 MG tablet, metoprolol succinate (TOPROL-XL) 25 MG 24 hr tablet, nitroGLYCERIN (NITROSTAT) 0.4 MG SL tablet, rosuvastatin (CRESTOR) 10 MG tablet  Mixed hyperlipidemia - Plan: rosuvastatin (CRESTOR) 10 MG tablet  Essential hypertension  Postoperative hypothyroidism - Plan: TSH + free T4, levothyroxine (SYNTHROID) 112 MCG tablet  Age-related osteoporosis without  current pathological fracture - Plan: VITAMIN D 25 Hydroxy (Vit-D Deficiency,  Fractures)  Prediabetes - Plan: Bayer DCA Hb A1c Waived  Need for hepatitis C screening test - Plan: Hepatitis C Antibody  Caregiver stress  Vaginal dryness - Plan: Ospemifene (OSPHENA) 60 MG TABS  She continues to closely follow-up with her heart doctor.  She will continue all medications as prescribed.  Refill sent on all meds that required  I did not repeat lipid panel today as this was just collected in November.  Her blood pressure was well-controlled  Clinically euthymic  Check vitamin D, DEXA obtained today  A1c collected given history of prediabetes and screening hepatitis C collected.  No known exposures  Osphena ordered for vaginal dryness.  Apparently this had been an insurance issue in the past  Counseled on healthy lifestyle choices, including diet (rich in fruits, vegetables and lean meats and low in salt and simple carbohydrates) and exercise (at least 30 minutes of moderate physical activity daily).  Patient to follow up 21m for thyroid  Dalinda Heidt M. Nadine Counts, DO

## 2023-07-27 LAB — TSH+FREE T4
Free T4: 1.78 ng/dL — ABNORMAL HIGH (ref 0.82–1.77)
TSH: 2.78 u[IU]/mL (ref 0.450–4.500)

## 2023-07-27 LAB — VITAMIN D 25 HYDROXY (VIT D DEFICIENCY, FRACTURES): Vit D, 25-Hydroxy: 49.1 ng/mL (ref 30.0–100.0)

## 2023-07-27 LAB — HEPATITIS C ANTIBODY: Hep C Virus Ab: REACTIVE — AB

## 2023-07-29 ENCOUNTER — Telehealth: Payer: Self-pay

## 2023-07-29 ENCOUNTER — Other Ambulatory Visit: Payer: Self-pay | Admitting: Family Medicine

## 2023-07-29 DIAGNOSIS — R768 Other specified abnormal immunological findings in serum: Secondary | ICD-10-CM

## 2023-07-29 NOTE — Telephone Encounter (Signed)
 Copied from CRM [id number]. Topic: Clinical - Lab/Test Results >> Jul 29, 2023 10:28 AM Antwanette L wrote: Reason for CRM: Patient has additional questions regarding lab results. Please contact patient at [phone]

## 2023-07-29 NOTE — Telephone Encounter (Signed)
See result encounter

## 2023-08-05 ENCOUNTER — Other Ambulatory Visit

## 2023-08-05 DIAGNOSIS — R768 Other specified abnormal immunological findings in serum: Secondary | ICD-10-CM

## 2023-08-05 DIAGNOSIS — E559 Vitamin D deficiency, unspecified: Secondary | ICD-10-CM

## 2023-08-06 ENCOUNTER — Encounter (INDEPENDENT_AMBULATORY_CARE_PROVIDER_SITE_OTHER): Payer: Self-pay | Admitting: Family Medicine

## 2023-08-06 DIAGNOSIS — R042 Hemoptysis: Secondary | ICD-10-CM

## 2023-08-06 LAB — VITAMIN D 25 HYDROXY (VIT D DEFICIENCY, FRACTURES): Vit D, 25-Hydroxy: 42 ng/mL (ref 30.0–100.0)

## 2023-08-06 LAB — HCV RNA QUANT: Hepatitis C Quantitation: NOT DETECTED [IU]/mL

## 2023-08-10 ENCOUNTER — Other Ambulatory Visit: Payer: Self-pay

## 2023-08-10 ENCOUNTER — Emergency Department (HOSPITAL_COMMUNITY)
Admission: EM | Admit: 2023-08-10 | Discharge: 2023-08-11 | Disposition: A | Discharge status: Home / Self Care | Attending: Emergency Medicine | Admitting: Emergency Medicine

## 2023-08-10 ENCOUNTER — Encounter (HOSPITAL_COMMUNITY): Payer: Self-pay | Admitting: *Deleted

## 2023-08-10 ENCOUNTER — Emergency Department (HOSPITAL_COMMUNITY)

## 2023-08-10 DIAGNOSIS — Z7982 Long term (current) use of aspirin: Secondary | ICD-10-CM | POA: Insufficient documentation

## 2023-08-10 DIAGNOSIS — R0602 Shortness of breath: Secondary | ICD-10-CM | POA: Diagnosis not present

## 2023-08-10 DIAGNOSIS — I2510 Atherosclerotic heart disease of native coronary artery without angina pectoris: Secondary | ICD-10-CM | POA: Diagnosis not present

## 2023-08-10 DIAGNOSIS — M7989 Other specified soft tissue disorders: Secondary | ICD-10-CM | POA: Diagnosis not present

## 2023-08-10 DIAGNOSIS — R079 Chest pain, unspecified: Secondary | ICD-10-CM | POA: Diagnosis present

## 2023-08-10 LAB — CBC
HCT: 38.1 % (ref 36.0–46.0)
Hemoglobin: 12.5 g/dL (ref 12.0–15.0)
MCH: 30.1 pg (ref 26.0–34.0)
MCHC: 32.8 g/dL (ref 30.0–36.0)
MCV: 91.8 fL (ref 80.0–100.0)
Platelets: 266 10*3/uL (ref 150–400)
RBC: 4.15 MIL/uL (ref 3.87–5.11)
RDW: 13.2 % (ref 11.5–15.5)
WBC: 8.7 10*3/uL (ref 4.0–10.5)
nRBC: 0 % (ref 0.0–0.2)

## 2023-08-10 LAB — BASIC METABOLIC PANEL WITH GFR
Anion gap: 12 (ref 5–15)
BUN: 15 mg/dL (ref 8–23)
CO2: 23 mmol/L (ref 22–32)
Calcium: 10 mg/dL (ref 8.9–10.3)
Chloride: 105 mmol/L (ref 98–111)
Creatinine, Ser: 0.64 mg/dL (ref 0.44–1.00)
GFR, Estimated: >60 mL/min (ref >60)
Glucose, Bld: 128 mg/dL — ABNORMAL HIGH (ref 70–99)
Potassium: 3.5 mmol/L (ref 3.5–5.1)
Sodium: 140 mmol/L (ref 135–145)

## 2023-08-10 LAB — TROPONIN I (HIGH SENSITIVITY): Troponin I (High Sensitivity): 3 ng/L (ref <18)

## 2023-08-10 NOTE — ED Triage Notes (Signed)
 The pt is c/o chest tightness all day with lt shoulder pain and some neck pain  while walking  and some lt arm numbness that is currently  present  she has a cardiac test  next month

## 2023-08-11 ENCOUNTER — Telehealth: Payer: Self-pay | Admitting: Cardiology

## 2023-08-11 DIAGNOSIS — I25118 Atherosclerotic heart disease of native coronary artery with other forms of angina pectoris: Secondary | ICD-10-CM

## 2023-08-11 DIAGNOSIS — R0609 Other forms of dyspnea: Secondary | ICD-10-CM

## 2023-08-11 LAB — TROPONIN I (HIGH SENSITIVITY): Troponin I (High Sensitivity): 4 ng/L (ref <18)

## 2023-08-11 NOTE — ED Provider Notes (Signed)
 [place] EMERGENCY DEPARTMENT AT [facility] Provider Note   CSN: [id number] Arrival date & time: 08/10/23  2107     History  No chief complaint on file.   Courtney White is a 68 y.o. female.  68 year old female who is a patient of Dr. Filiberto Hug with [facility] cardiology.  Patient has known coronary artery disease about 40% diffusely on angio last year.  No stents.  Is scheduled for stress test next month to follow-up with cardiologist in June.  She has a history of possibly could be stable angina.  With the last 2 days she has had significantly worsening chest pain with even less exertion.  Still goes away with rest.  It is left chest pressure radiating to her left arm and neck with some shortness of breath.  Pain-free at this time.  Also has some new ankle swelling.  No lightheadedness or diaphoresis with her chest pain.          Home Medications Prior to Admission medications   Medication Sig Start Date End Date Taking? Authorizing Provider  acetaminophen  (TYLENOL ) 325 MG tablet Take 650 mg by mouth every 6 (six) hours as needed for moderate pain.    [provider]  amLODipine  (NORVASC ) 5 MG tablet Take 1 tablet (5 mg total) by mouth daily. 07/26/23   Eliodoro Guerin, DO  aspirin  EC (ASPIRIN  LOW DOSE) 81 MG tablet TAKE 1 TABLET (81 MG TOTAL) BY MOUTH DAILY. SWALLOW WHOLE. 07/09/23   Patwardhan, Kaye Parsons, MD  Carboxymethylcellul-Glycerin (LUBRICATING EYE DROPS OP) Place 1 drop into both eyes daily as needed (dry eyes).    [provider]  diclofenac Sodium (VOLTAREN) 1 % GEL Apply 1 Application topically 3 (three) times daily as needed (joint pain).    [provider]  Elderberry 500 MG CAPS Take by mouth.    [provider]  hydrochlorothiazide  (HYDRODIURIL ) 25 MG tablet Take 0.5 tablets (12.5 mg total) by mouth daily. 07/25/23   Patwardhan, Kaye Parsons, MD  ibuprofen (ADVIL) 200 MG tablet Take 400 mg by mouth every 8  (eight) hours as needed (pain).    [provider]  levothyroxine  (SYNTHROID ) 112 MCG tablet Take 1 tablet (112 mcg total) by mouth daily. 07/26/23   Eliodoro Guerin, DO  metoprolol  succinate (TOPROL -XL) 25 MG 24 hr tablet Take 1 tablet (25 mg total) by mouth daily. TAKE WITH OR IMMEDIATELY FOLLOWING A MEAL. 07/26/23   Vicky Grange M, DO  nitroGLYCERIN  (NITROSTAT ) 0.4 MG SL tablet Place 1 tablet (0.4 mg total) under the tongue every 5 (five) minutes as needed for chest pain. 07/26/23   Eliodoro Guerin, DO  Ospemifene  (OSPHENA ) 60 MG TABS Take 1 tablet (60 mg total) by mouth daily. 07/26/23   Eliodoro Guerin, DO  rosuvastatin  (CRESTOR ) 10 MG tablet Take 1 tablet (10 mg total) by mouth daily. 07/26/23   Eliodoro Guerin, DO  Vitamin D , Ergocalciferol , (DRISDOL ) 1.25 MG (50000 UNIT) CAPS capsule TAKE 1 CAPSULE (50,000 UNITS TOTAL) BY MOUTH EVERY MONDAY. 07/08/23   Eliodoro Guerin, DO      Allergies    Patient has no known allergies.    Review of Systems   Review of Systems  Physical Exam Updated Vital Signs BP (!) 145/64   Pulse 90   Temp 97.8 F (36.6 C) (Oral)   Resp 18   Ht 5\' 5"  (1.651 m)   Wt 80.5 kg   LMP  (LMP Unknown)  SpO2 95%   BMI 29.53 kg/m  Physical Exam Vitals and nursing note reviewed.  Constitutional:      Appearance: She is well-developed.  HENT:     Head: Normocephalic and atraumatic.  Eyes:     Pupils: Pupils are equal, round, and reactive to light.  Cardiovascular:     Rate and Rhythm: Normal rate and regular rhythm.  Pulmonary:     Effort: No respiratory distress.     Breath sounds: No stridor.  Abdominal:     General: There is no distension.  Musculoskeletal:     Cervical back: Normal range of motion.  Skin:    General: Skin is warm and dry.  Neurological:     General: No focal deficit present.     Mental Status: She is alert.     ED Results / Procedures / Treatments   Labs (all labs ordered are listed, but only abnormal  results are displayed) Labs Reviewed  BASIC METABOLIC PANEL WITH GFR - Abnormal; Notable for the following components:      Result Value   Glucose, Bld 128 (*)    All other components within normal limits  CBC  TROPONIN I (HIGH SENSITIVITY)  TROPONIN I (HIGH SENSITIVITY)    EKG EKG Interpretation Date/Time:  Saturday August 10 2023 21:21:13 EDT Ventricular Rate:  97 PR Interval:  146 QRS Duration:  76 QT Interval:  370 QTC Calculation: 469 R Axis:   58  Text Interpretation: Normal sinus rhythm Normal ECG When compared with ECG of 25-Jul-2023 12:54, PREVIOUS ECG IS PRESENT Confirmed by Eve Hinders [phone]) on 08/11/2023 2:39:56 AM  Radiology DG Chest 2 View Result Date: 08/10/2023 CLINICAL DATA:  Chest pain and left arm numbness. EXAM: CHEST - 2 VIEW COMPARISON:  February 20, 2021 FINDINGS: The heart size and mediastinal contours are within normal limits. Low lung volumes are noted. Mild atelectatic changes are suspected within the left lung base. No acute infiltrate, pleural effusion or pneumothorax is identified. The visualized skeletal structures are unremarkable. IMPRESSION: No active cardiopulmonary disease. Electronically Signed   By: Virgle Grime M.D.   On: 08/10/2023 23:01    Procedures Procedures    Medications Ordered in ED Medications - No data to display  ED Course/ Medical Decision Making/ A&P                                 Medical Decision Making  Possibly unstable angina. D/w Dr. Teofilo Fellers with cardiology whose only recommendation was to try to get her to her outpatient appointment sooner or admit her but probably wouldn't get stress test on a Sunday.  Discussed options with patient and prefers to go home and fu w/ Dr. Filiberto Hug. I messaged him to make him aware. She will call the office Monday to try and schedule the appointment. Will utilize NTG at home. Discussed possibly starting Imdur for BP/anginal symptoms however she prefers to hold on that for  now. Will return here if symptoms return, don't improve or there are changes.    Final Clinical Impression(s) / ED Diagnoses Final diagnoses:  Chest pain, unspecified type    Rx / DC Orders ED Discharge Orders     None         Zebulun Deman, Reymundo Caulk, MD 08/11/23 [phone]

## 2023-08-11 NOTE — Telephone Encounter (Signed)
-----   Message from [facility] sent at 08/11/2023  6:22 AM EDT ----- Regarding: follow up Dr. Filiberto Hug,  This is a patient of yours that I saw in the ER tonight.  She has some worsening anginal symptoms.  Chest pain-free here.  Troponins EKG reassuring.  I spoke with the on-call fellow, Dr. Teofilo Fellers, who really had no recommendations.  Shared decision making with the patient and patient preferred to go home with follow-up with you closely in office.  It sounds like her next appointment is not until June and she has a stress test next month.  I was hoping that these could get moved up for her.  She will utilize nitro at home.  Also discussed maybe starting Imdur (as she apparently has some difficult to control hypertension as well) but she wanted to let that be your decision. Thanks [employer]

## 2023-08-11 NOTE — Telephone Encounter (Signed)
 Message from ER physician Dr. Luberta Ruse: Dr. Filiberto Hug,  This is a patient of yours that I saw in the ER tonight.  She has some worsening anginal symptoms.  Chest pain-free here.  Troponins EKG reassuring. Shared decision making with the patient and patient preferred to go home with follow-up with you closely in office.  It sounds like her next appointment is not until June and she has a stress test next month.  I was hoping that these could get moved up for her.  She will utilize nitro at home.  Also discussed maybe starting Imdur (as she apparently has some difficult to control hypertension as well) but she wanted to let that be your decision. Thanks Reymundo Caulk  My reply: Thank you for your message Dr. Luberta Ruse. I am out of office this coming week, but I will coordinate with my nurse to try and get her in sooner, or perhaps proceed with repeat cath, last in 06/2022.  Brooke, please check with her if she would be willing to proceed with repeat coronary angiogram evaluation at the first available date upon my return. If not, maybe we can change PET/CT stress test to Lexiscan  nuclear stress test.  Thanks MJP

## 2023-08-12 ENCOUNTER — Encounter (HOSPITAL_COMMUNITY): Payer: Self-pay

## 2023-08-12 ENCOUNTER — Telehealth: Payer: Self-pay | Admitting: Cardiology

## 2023-08-12 NOTE — Telephone Encounter (Signed)
 Spoke with pt and pt's husband Per pt since ED visit feels breathing has worsened will continue to monitor . Per pt walking to 1 room to another challenging Called [facility] long and attempting to move up cardiac pet  from 6/10 to 4/23 at 12:30 Awaiting prior auth if unable to get may do test either 29 or 30 Per husband if circumstances were different whe was at ED would have probably stayed but with Saverio Curling weekend opted to go home  Will forward to Dr Filiberto Hug for review .Evalee Hila

## 2023-08-12 NOTE — Telephone Encounter (Signed)
 Pt c/o of Chest Pain: STAT if active (IN THIS MOMENT) CP, including tightness, pressure, jaw pain, shoulder/upper arm/back pain, SOB, nausea, and vomiting.  1. Are you having CP right now (tightness, pressure, or discomfort)?  Not right now, S/O says patient has been resting  2. Are you experiencing any other symptoms (ex. SOB, nausea, vomiting, sweating)?  Fatigue  3. How long have you been experiencing CP?  See recent encounter/ED notes--has been ongoing for a while + becoming gradually worse, per S/O  4. Is your CP continuous or coming and going?  Coming and going, mainly when up and moving around or very minimal exertion   5. Have you taken Nitroglycerin ?  No, but would like to know when to take it.

## 2023-08-13 ENCOUNTER — Telehealth (HOSPITAL_COMMUNITY): Payer: Self-pay | Admitting: *Deleted

## 2023-08-13 ENCOUNTER — Ambulatory Visit (INDEPENDENT_AMBULATORY_CARE_PROVIDER_SITE_OTHER)

## 2023-08-13 DIAGNOSIS — Z780 Asymptomatic menopausal state: Secondary | ICD-10-CM | POA: Diagnosis not present

## 2023-08-13 NOTE — Telephone Encounter (Signed)
Attempted to call patient regarding upcoming cardiac PET appointment. Left message on voicemail with name and callback number  Larey Brick RN Navigator Cardiac Imaging [facility] [phone] Office [phone] Cell  Reminder to avoid caffeine 12 hours prior to her cardiac PET study.

## 2023-08-13 NOTE — Telephone Encounter (Signed)
 Left message to call back

## 2023-08-14 ENCOUNTER — Encounter (HOSPITAL_COMMUNITY)
Admission: RE | Admit: 2023-08-14 | Discharge: 2023-08-14 | Disposition: A | Discharge status: Home / Self Care | Source: Ambulatory Visit | Attending: Cardiology | Admitting: Cardiology

## 2023-08-14 ENCOUNTER — Encounter: Payer: Self-pay | Admitting: Cardiology

## 2023-08-14 DIAGNOSIS — I25118 Atherosclerotic heart disease of native coronary artery with other forms of angina pectoris: Secondary | ICD-10-CM | POA: Insufficient documentation

## 2023-08-14 DIAGNOSIS — R0609 Other forms of dyspnea: Secondary | ICD-10-CM | POA: Insufficient documentation

## 2023-08-14 LAB — NM PET CT CARDIAC PERFUSION MULTI W/ABSOLUTE BLOODFLOW
LV dias vol: 56 mL (ref 46–106)
MBFR: 2.24
Nuc Rest EF: 71 %
Nuc Stress EF: 72 %
Peak HR: 104 {beats}/min
Rest HR: 81 {beats}/min
Rest MBF: 1.19 ml/g/min
Rest Nuclear Isotope Dose: 20.9 mCi
Rest perfusion cavity size (mL): 56 mL
ST Depression (mm): 0 mm
Stress MBF: 2.66 ml/g/min
Stress Nuclear Isotope Dose: 20.9 mCi
Stress perfusion cavity size (mL): 61 mL
TID: 1.11

## 2023-08-14 MED ORDER — RUBIDIUM RB82 GENERATOR (RUBYFILL)
20.9400 | PACK | Freq: Once | INTRAVENOUS | Status: AC
  Administered 2023-08-14: 20.94 via INTRAVENOUS

## 2023-08-14 MED ORDER — REGADENOSON 0.4 MG/5ML IV SOLN
0.4000 mg | Freq: Once | INTRAVENOUS | Status: AC
  Administered 2023-08-14: 0.4 mg via INTRAVENOUS

## 2023-08-14 MED ORDER — REGADENOSON 0.4 MG/5ML IV SOLN
INTRAVENOUS | Status: AC
  Filled 2023-08-14: qty 5

## 2023-08-14 MED ORDER — RUBIDIUM RB82 GENERATOR (RUBYFILL)
20.8800 | PACK | Freq: Once | INTRAVENOUS | Status: AC
  Administered 2023-08-14: 20.88 via INTRAVENOUS

## 2023-08-14 NOTE — Progress Notes (Signed)
 Normal stress test and minor abnormalities which will be discussed on her visit with MP

## 2023-08-14 NOTE — Telephone Encounter (Signed)
 Left message to call back

## 2023-08-16 ENCOUNTER — Telehealth: Payer: Self-pay | Admitting: Cardiology

## 2023-08-16 ENCOUNTER — Encounter: Payer: Self-pay | Admitting: Family Medicine

## 2023-08-16 ENCOUNTER — Other Ambulatory Visit: Payer: Self-pay | Admitting: Family Medicine

## 2023-08-16 DIAGNOSIS — R0609 Other forms of dyspnea: Secondary | ICD-10-CM

## 2023-08-16 DIAGNOSIS — I209 Angina pectoris, unspecified: Secondary | ICD-10-CM

## 2023-08-16 DIAGNOSIS — M810 Age-related osteoporosis without current pathological fracture: Secondary | ICD-10-CM

## 2023-08-16 MED ORDER — DENOSUMAB 60 MG/ML ~~LOC~~ SOSY: 60.0000 mg | PREFILLED_SYRINGE | Freq: Once | SUBCUTANEOUS | Status: AC

## 2023-08-16 NOTE — Telephone Encounter (Signed)
  Knox Perl, MD 08/14/2023  9:30 PM EDT     Normal stress test and minor abnormalities which will be discussed on her visit with MP    Left the pt a message to call the office back.

## 2023-08-16 NOTE — Telephone Encounter (Signed)
 Pt called back and says that she will consider retrying the Prolia  only if her insurance covers it. Please contact her back.

## 2023-08-16 NOTE — Telephone Encounter (Signed)
 Pt returning call for results, requesting cb

## 2023-08-19 NOTE — Telephone Encounter (Signed)
 Spoke with patient and she is aware of stress test results.   She states she does not want to wait until her appointment to discuss her results. She would like a call back to discuss

## 2023-08-19 NOTE — Telephone Encounter (Signed)
 I personally called the patient and left a voicemail due to no answer.  In short, PET CR stress test is reassuring from cardiac standpoint. While there is calcium  in the heart arteries, the flow to heart muscle is normal and suggests no severe narrowing in the heart arteries. It is unlikely that your symptoms are coming from blockages in the arteries. Most likely explanation of shortness of breath and possible chest pain could be due to combination of hypertension, obesity, and deconditioning. We could consider referral for cardiopulmonary rehab due to angina and exertional dyspnea.  Thanks MJP

## 2023-08-19 NOTE — Telephone Encounter (Signed)
 Pt returning call, requesting cb

## 2023-08-20 ENCOUNTER — Other Ambulatory Visit: Payer: Self-pay

## 2023-08-20 DIAGNOSIS — M810 Age-related osteoporosis without current pathological fracture: Secondary | ICD-10-CM

## 2023-08-20 MED ORDER — DENOSUMAB 60 MG/ML ~~LOC~~ SOSY
60.0000 mg | PREFILLED_SYRINGE | Freq: Once | SUBCUTANEOUS | Status: AC
  Administered 2023-10-14: 60 mg via SUBCUTANEOUS

## 2023-08-20 NOTE — Telephone Encounter (Signed)
 Review other phone note.

## 2023-08-20 NOTE — Telephone Encounter (Signed)
 Referral order placed.

## 2023-08-20 NOTE — Progress Notes (Signed)
 [employer] - sent to PA team for benefit verification

## 2023-08-20 NOTE — Telephone Encounter (Signed)
 Please review other phone note

## 2023-08-21 ENCOUNTER — Encounter: Payer: Self-pay | Admitting: Family Medicine

## 2023-08-21 ENCOUNTER — Other Ambulatory Visit (HOSPITAL_COMMUNITY): Payer: Self-pay

## 2023-08-21 ENCOUNTER — Other Ambulatory Visit (INDEPENDENT_AMBULATORY_CARE_PROVIDER_SITE_OTHER)

## 2023-08-21 ENCOUNTER — Telehealth: Payer: Self-pay

## 2023-08-21 DIAGNOSIS — R042 Hemoptysis: Secondary | ICD-10-CM | POA: Diagnosis not present

## 2023-08-21 MED ORDER — AZITHROMYCIN 250 MG PO TABS: ORAL_TABLET | ORAL | 0 refills | Status: DC

## 2023-08-21 NOTE — Telephone Encounter (Signed)
 Courtney White

## 2023-08-21 NOTE — Telephone Encounter (Signed)
 Ok please make sure she gets set up. Previous note looks like they were working on ins for her?

## 2023-08-21 NOTE — Telephone Encounter (Signed)
 Prolia VOB initiated via [email or web address]  Next Prolia inj DUE: [employer]

## 2023-08-21 NOTE — Telephone Encounter (Signed)
 No update at this time - patient notified

## 2023-08-21 NOTE — Telephone Encounter (Signed)
 Prior Authorization form/request asks a question that requires your assistance. Please see the question below and advise accordingly. The PA will not be submitted until the necessary information is received.

## 2023-08-29 ENCOUNTER — Encounter (HOSPITAL_COMMUNITY): Payer: Self-pay

## 2023-08-29 NOTE — Telephone Encounter (Signed)
 PA submitted via [employer] portal. Status pending.

## 2023-08-30 ENCOUNTER — Telehealth (HOSPITAL_COMMUNITY): Payer: Self-pay

## 2023-08-30 ENCOUNTER — Encounter (HOSPITAL_COMMUNITY)

## 2023-08-30 NOTE — Telephone Encounter (Addendum)
 Information has been sent to clinical pharmacist for appeals review. It may take 5-7 days to prepare the necessary documentation to request the appeal from the insurance.    Per representative, we should receive the official denial letter in the next couple of days.

## 2023-09-02 ENCOUNTER — Telehealth: Payer: Self-pay | Admitting: Pharmacist

## 2023-09-02 NOTE — Telephone Encounter (Signed)
 An appeal has been submitted for Prolia . Will advise when response is received, please be advised that most companies may take 30 days to make a decision. Appeal letter and supporting documentation have been faxed to [phone] on 09/02/2023 @10 :44 am.  Thank you, Dene Fines, PharmD Clinical Pharmacist  [place]  Direct Dial: [phone]

## 2023-09-04 ENCOUNTER — Encounter (HOSPITAL_COMMUNITY)
Admission: RE | Admit: 2023-09-04 | Discharge: 2023-09-04 | Disposition: A | Discharge status: Home / Self Care | Source: Ambulatory Visit | Attending: Cardiology | Admitting: Cardiology

## 2023-09-04 VITALS — Ht 67.0 in | Wt 175.9 lb

## 2023-09-04 DIAGNOSIS — I25118 Atherosclerotic heart disease of native coronary artery with other forms of angina pectoris: Secondary | ICD-10-CM | POA: Insufficient documentation

## 2023-09-04 DIAGNOSIS — R0609 Other forms of dyspnea: Secondary | ICD-10-CM | POA: Diagnosis present

## 2023-09-04 NOTE — Patient Instructions (Addendum)
 Patient Instructions  Patient Details  Name: Courtney White MRN: [id number] Date of Birth: [birth year] Referring Provider:  Cody Das, MD  Below are your personal goals for exercise, nutrition, and risk factors. Our goal is to help you stay on track towards obtaining and maintaining these goals. We will be discussing your progress on these goals with you throughout the program.  Initial Exercise Prescription:  Initial Exercise Prescription - 09/04/23 1300       Date of Initial Exercise RX and Referring Provider   Date 09/04/23    Referring Provider Fransico Ivy MD      Treadmill   MPH 2    Grade 0.5    Minutes 15    METs 2.67      NuStep   Level 2    SPM 50    Minutes 15    METs 2      Prescription Details   Frequency (times per week) 2    Duration Progress to 30 minutes of continuous aerobic without signs/symptoms of physical distress      Intensity   THRR 40-80% of Max Heartrate 109-138    Ratings of Perceived Exertion 11-13    Perceived Dyspnea 0-4      Resistance Training   Training Prescription Yes    Weight 4    Reps 10-15             Exercise Goals: Frequency: Be able to perform aerobic exercise two to three times per week in program working toward 2-5 days per week of home exercise.  Intensity: Work with a perceived exertion of 11 (fairly light) - 15 (hard) while following your exercise prescription.  We will make changes to your prescription with you as you progress through the program.   Duration: Be able to do 30 to 45 minutes of continuous aerobic exercise in addition to a 5 minute warm-up and a 5 minute cool-down routine.   Nutrition Goals: Your personal nutrition goals will be established when you do your nutrition analysis with the dietician.  The following are general nutrition guidelines to follow: Cholesterol < 200mg /day Sodium < 1500mg /day Fiber: Women over 50 yrs - 21 grams per day  Personal Goals:  Personal Goals  and Risk Factors at Admission - 09/04/23 1303       Core Components/Risk Factors/Patient Goals on Admission   Improve shortness of breath with ADL's Yes    Intervention Provide education, individualized exercise plan and daily activity instruction to help decrease symptoms of SOB with activities of daily living.    Expected Outcomes Short Term: Improve cardiorespiratory fitness to achieve a reduction of symptoms when performing ADLs;Long Term: Be able to perform more ADLs without symptoms or delay the onset of symptoms    Hypertension Yes    Intervention Provide education on lifestyle modifcations including regular physical activity/exercise, weight management, moderate sodium restriction and increased consumption of fresh fruit, vegetables, and low fat dairy, alcohol moderation, and smoking cessation.;Monitor prescription use compliance.    Expected Outcomes Short Term: Continued assessment and intervention until BP is < 140/38mm HG in hypertensive participants. < 130/46mm HG in hypertensive participants with diabetes, heart failure or chronic kidney disease.;Long Term: Maintenance of blood pressure at goal levels.    Lipids Yes    Intervention Provide education and support for participant on nutrition & aerobic/resistive exercise along with prescribed medications to achieve LDL 70mg , HDL >40mg .    Expected Outcomes Long Term: Cholesterol controlled with medications as prescribed, with individualized  exercise RX and with personalized nutrition plan. Value goals: LDL < 70mg , HDL > 40 mg.;Short Term: Participant states understanding of desired cholesterol values and is compliant with medications prescribed. Participant is following exercise prescription and nutrition guidelines.    Stress Yes    Intervention Offer individual and/or small group education and counseling on adjustment to heart disease, stress management and health-related lifestyle change. Teach and support self-help strategies.;Refer  participants experiencing significant psychosocial distress to appropriate mental health specialists for further evaluation and treatment. When possible, include family members and significant others in education/counseling sessions.    Expected Outcomes Short Term: Participant demonstrates changes in health-related behavior, relaxation and other stress management skills, ability to obtain effective social support, and compliance with psychotropic medications if prescribed.;Long Term: Emotional wellbeing is indicated by absence of clinically significant psychosocial distress or social isolation.             Exercise Goals and Review:  Exercise Goals     Row Name 09/04/23 1324             Exercise Goals   Increase Physical Activity Yes       Intervention Provide advice, education, support and counseling about physical activity/exercise needs.;Develop an individualized exercise prescription for aerobic and resistive training based on initial evaluation findings, risk stratification, comorbidities and participant's personal goals.       Expected Outcomes Short Term: Attend rehab on a regular basis to increase amount of physical activity.;Long Term: Add in home exercise to make exercise part of routine and to increase amount of physical activity.;Long Term: Exercising regularly at least 3-5 days a week.       Increase Strength and Stamina Yes       Intervention Provide advice, education, support and counseling about physical activity/exercise needs.;Develop an individualized exercise prescription for aerobic and resistive training based on initial evaluation findings, risk stratification, comorbidities and participant's personal goals.       Expected Outcomes Short Term: Increase workloads from initial exercise prescription for resistance, speed, and METs.;Short Term: Perform resistance training exercises routinely during rehab and add in resistance training at home;Long Term: Improve  cardiorespiratory fitness, muscular endurance and strength as measured by increased METs and functional capacity ( )       Able to understand and use rate of perceived exertion (RPE) scale Yes       Intervention Provide education and explanation on how to use RPE scale       Expected Outcomes Short Term: Able to use RPE daily in rehab to express subjective intensity level;Long Term:  Able to use RPE to guide intensity level when exercising independently       Able to understand and use Dyspnea scale Yes       Intervention Provide education and explanation on how to use Dyspnea scale       Expected Outcomes Short Term: Able to use Dyspnea scale daily in rehab to express subjective sense of shortness of breath during exertion;Long Term: Able to use Dyspnea scale to guide intensity level when exercising independently       Knowledge and understanding of Target Heart Rate Range (THRR) Yes       Intervention Provide education and explanation of THRR including how the numbers were predicted and where they are located for reference       Expected Outcomes Short Term: Able to state/look up THRR;Long Term: Able to use THRR to govern intensity when exercising independently;Short Term: Able to use daily as guideline for  intensity in rehab       Able to check pulse independently Yes       Intervention Provide education and demonstration on how to check pulse in carotid and radial arteries.;Review the importance of being able to check your own pulse for safety during independent exercise       Expected Outcomes Short Term: Able to explain why pulse checking is important during independent exercise;Long Term: Able to check pulse independently and accurately       Understanding of Exercise Prescription Yes       Intervention Provide education, explanation, and written materials on patient's individual exercise prescription       Expected Outcomes Short Term: Able to explain program exercise prescription;Long  Term: Able to explain home exercise prescription to exercise independently                Copy of goals given to participant.

## 2023-09-04 NOTE — Progress Notes (Signed)
 Patient attend orientation today.  Patient is attending Cardiac Rehabilitation Program.  Documentation for diagnosis can be found in [facility].  Reviewed medical chart, RPE/RPD, gym safety, and program guidelines.  Patient was fitted to equipment they will be using during rehab.  Patient is scheduled to start exercise on 09/09/23.   Initial ITP created and sent for review and signature by Dr. Armida Lander, Medical Director for Cardiac Rehabilitation Program.

## 2023-09-04 NOTE — Progress Notes (Signed)
 Cardiac Individual Treatment Plan  Patient Details  Name: Courtney White MRN: [id number] Date of Birth: 1956/02/21 Referring Provider:   Flowsheet Row CARDIAC REHAB PHASE II EXERCISE from 09/04/2023 in [facility] CARDIAC REHABILITATION  Referring Provider Fransico Ivy MD       Initial Encounter Date:  Flowsheet Row CARDIAC REHAB PHASE II EXERCISE from 09/04/2023 in [place] CARDIAC REHABILITATION  Date 09/04/23       Visit Diagnosis: Atherosclerosis of native coronary artery of native heart with stable angina pectoris (HCC)  DOE (dyspnea on exertion)  Patient's Home Medications on Admission:  Current Outpatient Medications:    acetaminophen  (TYLENOL ) 325 MG tablet, Take 650 mg by mouth every 6 (six) hours as needed for moderate pain., Disp: , Rfl:    amLODipine  (NORVASC ) 5 MG tablet, Take 1 tablet (5 mg total) by mouth daily., Disp: 100 tablet, Rfl: 3   aspirin  EC (ASPIRIN  LOW DOSE) 81 MG tablet, TAKE 1 TABLET (81 MG TOTAL) BY MOUTH DAILY. SWALLOW WHOLE., Disp: 90 tablet, Rfl: 1   azithromycin  (ZITHROMAX ) 250 MG tablet, Take 2 tablets today, then take 1 tablet daily until gone., Disp: 6 tablet, Rfl: 0   Carboxymethylcellul-Glycerin (LUBRICATING EYE DROPS OP), Place 1 drop into both eyes daily as needed (dry eyes)., Disp: , Rfl:    diclofenac Sodium (VOLTAREN) 1 % GEL, Apply 1 Application topically 3 (three) times daily as needed (joint pain)., Disp: , Rfl:    Elderberry 500 MG CAPS, Take by mouth., Disp: , Rfl:    hydrochlorothiazide  (HYDRODIURIL ) 25 MG tablet, Take 0.5 tablets (12.5 mg total) by mouth daily., Disp: 45 tablet, Rfl: 3   ibuprofen (ADVIL) 200 MG tablet, Take 400 mg by mouth every 8 (eight) hours as needed (pain)., Disp: , Rfl:    levothyroxine  (SYNTHROID ) 112 MCG tablet, Take 1 tablet (112 mcg total) by mouth daily., Disp: 100 tablet, Rfl: 3   metoprolol  succinate (TOPROL -XL) 25 MG 24 hr tablet, Take 1 tablet (25 mg total) by mouth daily. TAKE WITH OR  IMMEDIATELY FOLLOWING A MEAL., Disp: 100 tablet, Rfl: 3   nitroGLYCERIN  (NITROSTAT ) 0.4 MG SL tablet, Place 1 tablet (0.4 mg total) under the tongue every 5 (five) minutes as needed for chest pain., Disp: 25 tablet, Rfl: 3   Ospemifene  (OSPHENA ) 60 MG TABS, Take 1 tablet (60 mg total) by mouth daily., Disp: 90 tablet, Rfl: 3   rosuvastatin  (CRESTOR ) 10 MG tablet, Take 1 tablet (10 mg total) by mouth daily., Disp: 100 tablet, Rfl: 3   Vitamin D , Ergocalciferol , (DRISDOL ) 1.25 MG (50000 UNIT) CAPS capsule, TAKE 1 CAPSULE (50,000 UNITS TOTAL) BY MOUTH EVERY MONDAY., Disp: 12 capsule, Rfl: 3  Current Facility-Administered Medications:    denosumab  (PROLIA ) injection 60 mg, 60 mg, Subcutaneous, Once, Eliodoro Guerin, DO  Past Medical History: Past Medical History:  Diagnosis Date   COVID-19 12/02/2020   Hepatitis C 1992   s/p treatment.  she has cleared virus   HTN (hypertension)    Thyroid  disease    Vitiligo     Tobacco Use: Social History   Tobacco Use  Smoking Status Never  Smokeless Tobacco Never    Labs: Review Flowsheet  More data exists      Latest Ref Rng & Units 07/07/2021 09/26/2022 10/05/2022 12/27/2022 07/26/2023  Labs for ITP Cardiac and Pulmonary Rehab  Cholestrol 100 - 199 mg/dL 657  846  - 962  -  LDL (calc) 0 - 99 mg/dL 71  75  - 69  -  HDL-C >39 mg/dL 56  53  - 55  -  Trlycerides 0 - 149 mg/dL 82  94  - 81  -  Hemoglobin A1c 4.8 - 5.6 % - - 5.6  6.0  6.2     Capillary Blood Glucose: No results found for: "GLUCAP"   Exercise Target Goals: Exercise Program Goal: Individual exercise prescription set using results from initial 6 min walk test and THRR while considering  patient's activity barriers and safety.   Exercise Prescription Goal: Starting with aerobic activity 30 plus minutes a day, 3 days per week for initial exercise prescription. Provide home exercise prescription and guidelines that participant acknowledges understanding prior to  discharge.  Activity Barriers & Risk Stratification:  Activity Barriers & Cardiac Risk Stratification - 09/04/23 1303       Activity Barriers & Cardiac Risk Stratification   Activity Barriers Shortness of Breath;Balance Concerns;Chest Pain/Angina    Cardiac Risk Stratification Moderate             6 Minute Walk:  6 Minute Walk     Row Name 09/04/23 1339         6 Minute Walk   Phase Initial     Distance 1435 feet     Walk Time 6 minutes     # of Rest Breaks 0     MPH 2.72     METS 3.17     RPE 12     Perceived Dyspnea  0     VO2 Peak 11.11     Symptoms No     Resting HR 80 bpm     Resting BP 130/80     Resting Oxygen Saturation  97 %     Exercise Oxygen Saturation  during 6 min walk 98 %     Max Ex. HR 109 bpm     Max Ex. BP 136/82     2 Minute Post BP 128/80              Oxygen Initial Assessment:   Oxygen Re-Evaluation:   Oxygen Discharge (Final Oxygen Re-Evaluation):   Initial Exercise Prescription:  Initial Exercise Prescription - 09/04/23 1300       Date of Initial Exercise RX and Referring Provider   Date 09/04/23    Referring Provider Fransico Ivy MD      Treadmill   MPH 2    Grade 0.5    Minutes 15    METs 2.67      NuStep   Level 2    SPM 50    Minutes 15    METs 2      Prescription Details   Frequency (times per week) 2    Duration Progress to 30 minutes of continuous aerobic without signs/symptoms of physical distress      Intensity   THRR 40-80% of Max Heartrate 109-138    Ratings of Perceived Exertion 11-13    Perceived Dyspnea 0-4      Resistance Training   Training Prescription Yes    Weight 4    Reps 10-15             Perform Capillary Blood Glucose checks as needed.  Exercise Prescription Changes:   Exercise Prescription Changes     Row Name 09/04/23 1300             Response to Exercise   Blood Pressure (Admit) 130/80       Blood Pressure (Exercise) 136/82       Blood  Pressure  (Exit) 128/80       Heart Rate (Admit) 80 bpm       Heart Rate (Exercise) 109 bpm       Heart Rate (Exit) 85 bpm       Oxygen Saturation (Admit) 97 %       Oxygen Saturation (Exercise) 98 %       Oxygen Saturation (Exit) 98 %       Rating of Perceived Exertion (Exercise) 12       Perceived Dyspnea (Exercise) 0                Exercise Comments:   Exercise Comments     Row Name 09/04/23 1324           Exercise Comments Patient states she used to do the silver sneakers program up until about a month ago. Does not do much exercise now.                Exercise Goals and Review:   Exercise Goals     Row Name 09/04/23 1324             Exercise Goals   Increase Physical Activity Yes       Intervention Provide advice, education, support and counseling about physical activity/exercise needs.;Develop an individualized exercise prescription for aerobic and resistive training based on initial evaluation findings, risk stratification, comorbidities and participant's personal goals.       Expected Outcomes Short Term: Attend rehab on a regular basis to increase amount of physical activity.;Long Term: Add in home exercise to make exercise part of routine and to increase amount of physical activity.;Long Term: Exercising regularly at least 3-5 days a week.       Increase Strength and Stamina Yes       Intervention Provide advice, education, support and counseling about physical activity/exercise needs.;Develop an individualized exercise prescription for aerobic and resistive training based on initial evaluation findings, risk stratification, comorbidities and participant's personal goals.       Expected Outcomes Short Term: Increase workloads from initial exercise prescription for resistance, speed, and METs.;Short Term: Perform resistance training exercises routinely during rehab and add in resistance training at home;Long Term: Improve cardiorespiratory fitness, muscular endurance  and strength as measured by increased METs and functional capacity ( )       Able to understand and use rate of perceived exertion (RPE) scale Yes       Intervention Provide education and explanation on how to use RPE scale       Expected Outcomes Short Term: Able to use RPE daily in rehab to express subjective intensity level;Long Term:  Able to use RPE to guide intensity level when exercising independently       Able to understand and use Dyspnea scale Yes       Intervention Provide education and explanation on how to use Dyspnea scale       Expected Outcomes Short Term: Able to use Dyspnea scale daily in rehab to express subjective sense of shortness of breath during exertion;Long Term: Able to use Dyspnea scale to guide intensity level when exercising independently       Knowledge and understanding of Target Heart Rate Range (THRR) Yes       Intervention Provide education and explanation of THRR including how the numbers were predicted and where they are located for reference       Expected Outcomes Short Term: Able to state/look up THRR;Long Term: Able to use THRR  to govern intensity when exercising independently;Short Term: Able to use daily as guideline for intensity in rehab       Able to check pulse independently Yes       Intervention Provide education and demonstration on how to check pulse in carotid and radial arteries.;Review the importance of being able to check your own pulse for safety during independent exercise       Expected Outcomes Short Term: Able to explain why pulse checking is important during independent exercise;Long Term: Able to check pulse independently and accurately       Understanding of Exercise Prescription Yes       Intervention Provide education, explanation, and written materials on patient's individual exercise prescription       Expected Outcomes Short Term: Able to explain program exercise prescription;Long Term: Able to explain home exercise prescription  to exercise independently                Exercise Goals Re-Evaluation :    Discharge Exercise Prescription (Final Exercise Prescription Changes):  Exercise Prescription Changes - 09/04/23 1300       Response to Exercise   Blood Pressure (Admit) 130/80    Blood Pressure (Exercise) 136/82    Blood Pressure (Exit) 128/80    Heart Rate (Admit) 80 bpm    Heart Rate (Exercise) 109 bpm    Heart Rate (Exit) 85 bpm    Oxygen Saturation (Admit) 97 %    Oxygen Saturation (Exercise) 98 %    Oxygen Saturation (Exit) 98 %    Rating of Perceived Exertion (Exercise) 12    Perceived Dyspnea (Exercise) 0             Nutrition:  Target Goals: Understanding of nutrition guidelines, daily intake of sodium 1500mg , cholesterol 200mg , calories 30% from fat and 7% or less from saturated fats, daily to have 5 or more servings of fruits and vegetables.  Biometrics:  Pre Biometrics - 09/04/23 1342       Pre Biometrics   Height 5\' 7"  (1.702 m)    Weight 79.8 kg    Waist Circumference 37 inches    Hip Circumference 41 inches    Waist to Hip Ratio 0.9 %    BMI (Calculated) 27.55    Grip Strength 15.1 kg    Single Leg Stand 10.4 seconds              Nutrition Therapy Plan and Nutrition Goals:  Nutrition Therapy & Goals - 09/04/23 1302       Intervention Plan   Intervention Prescribe, educate and counsel regarding individualized specific dietary modifications aiming towards targeted core components such as weight, hypertension, lipid management, diabetes, heart failure and other comorbidities.;Nutrition handout(s) given to patient.    Expected Outcomes Short Term Goal: Understand basic principles of dietary content, such as calories, fat, sodium, cholesterol and nutrients.;Short Term Goal: A plan has been developed with personal nutrition goals set during dietitian appointment.;Long Term Goal: Adherence to prescribed nutrition plan.             Nutrition  Assessments:  MEDIFICTS Score Key: >=70 Need to make dietary changes  40-70 Heart Healthy Diet <= 40 Therapeutic Level Cholesterol Diet  Flowsheet Row CARDIAC REHAB PHASE II EXERCISE from 09/04/2023 in [facility] CARDIAC REHABILITATION  Picture Your Plate Total Score on Admission 61      Picture Your Plate Scores: <16 Unhealthy dietary pattern with much room for improvement. 41-50 Dietary pattern unlikely to meet recommendations for good  health and room for improvement. 51-60 More healthful dietary pattern, with some room for improvement.  >60 Healthy dietary pattern, although there may be some specific behaviors that could be improved.    Nutrition Goals Re-Evaluation:   Nutrition Goals Discharge (Final Nutrition Goals Re-Evaluation):   Psychosocial: Target Goals: Acknowledge presence or absence of significant depression and/or stress, maximize coping skills, provide positive support system. Participant is able to verbalize types and ability to use techniques and skills needed for reducing stress and depression.  Initial Review & Psychosocial Screening:  Initial Psych Review & Screening - 09/04/23 1301       Initial Review   Current issues with Current Stress Concerns    Source of Stress Concerns Family;Chronic Illness;Unable to perform yard/household activities    Comments Aracelia has her mother moved in with her who is suffering from dementia so she is experiencing caregiver stress. She also states it takes her longer to do housework than it used to.      Family Dynamics   Good Support System? Yes    Comments Maggie states her significant other is her support system. They have been engaged for several years now but have yet to get married.      Barriers   Psychosocial barriers to participate in program The patient should benefit from training in stress management and relaxation.      Screening Interventions   Interventions To provide support and resources with identified  psychosocial needs;Encouraged to exercise    Expected Outcomes Short Term goal: Utilizing psychosocial counselor, staff and physician to assist with identification of specific Stressors or current issues interfering with healing process. Setting desired goal for each stressor or current issue identified.;Long Term Goal: Stressors or current issues are controlled or eliminated.;Short Term goal: Identification and review with participant of any Quality of Life or Depression concerns found by scoring the questionnaire.;Long Term goal: The participant improves quality of Life and PHQ9 Scores as seen by post scores and/or verbalization of changes             Quality of Life Scores:  Quality of Life - 09/04/23 1344       Quality of Life   Select Quality of Life      Quality of Life Scores   Health/Function Pre 17.2 %    Socioeconomic Pre 25.94 %    Psych/Spiritual Pre 28.29 %    Family Pre 6.6 %    GLOBAL Pre 19.9 %            Scores of 19 and below usually indicate a poorer quality of life in these areas.  A difference of  2-3 points is a clinically meaningful difference.  A difference of 2-3 points in the total score of the Quality of Life Index has been associated with significant improvement in overall quality of life, self-image, physical symptoms, and general health in studies assessing change in quality of life.  PHQ-9: Review Flowsheet  More data exists      09/04/2023 07/26/2023 03/27/2023 10/05/2022 03/22/2022  Depression screen PHQ 2/9  Decreased Interest 0 0 0 0 0  Down, Depressed, Hopeless 0 0 0 0 0  PHQ - 2 Score 0 0 0 0 0  Altered sleeping 1 0 - 0 -  Tired, decreased energy 1 0 - 0 -  Change in appetite 0 0 - 0 -  Feeling bad or failure about yourself  0 0 - 0 -  Trouble concentrating 0 0 - 0 -  Moving slowly or fidgety/restless 0 0 - 0 -  Suicidal thoughts 0 0 - 0 -  PHQ-9 Score 2 0 - 0 -  Difficult doing work/chores Not difficult at all Not difficult at all - Not  difficult at all -   Interpretation of Total Score  Total Score Depression Severity:  1-4 = Minimal depression, 5-9 = Mild depression, 10-14 = Moderate depression, 15-19 = Moderately severe depression, 20-27 = Severe depression   Psychosocial Evaluation and Intervention:  Psychosocial Evaluation - 09/04/23 1302       Psychosocial Evaluation & Interventions   Interventions Stress management education;Encouraged to exercise with the program and follow exercise prescription;Relaxation education    Comments Emyah is a pleasant lady who is eager to start the program because she wants to start feeling better and get her endurance and strength back. Her mobility is good although she says she gets SOB easily and it makes her have coughing spells which then causes her to get dizzy. She states that it takes her longer to do house and yard work now. Where it used to take her a day, now it may take her several days to do it. She has been experiencing some stress from that and some stress from her mother living with her who suffers from dementia, so she is undergoing some caregiver stress. She states that her significant other is a great support system for her. She doesn't currently do any exercise as about a month ago she was doing silver sneakers but not anymore. She is retired and in her spare time she likes to garden and make floral arrangements.    Expected Outcomes Short: To feel better! Long: Increase strength and stamina.    Continue Psychosocial Services  Follow up required by staff             Psychosocial Re-Evaluation:   Psychosocial Discharge (Final Psychosocial Re-Evaluation):   Vocational Rehabilitation: Provide vocational rehab assistance to qualifying candidates.   Vocational Rehab Evaluation & Intervention:  Vocational Rehab - 09/04/23 1303       Initial Vocational Rehab Evaluation & Intervention   Assessment shows need for Vocational Rehabilitation No              Education: Education Goals: Education classes will be provided on a weekly basis, covering required topics. Participant will state understanding/return demonstration of topics presented.  Learning Barriers/Preferences:  Learning Barriers/Preferences - 09/04/23 1302       Learning Barriers/Preferences   Learning Barriers None    Learning Preferences Audio;Computer/Internet;Group Instruction;Skilled Demonstration;Verbal Instruction;Video             Education Topics: Hypertension, Hypertension Reduction -Define heart disease and high blood pressure. Discus how high blood pressure affects the body and ways to reduce high blood pressure.   Exercise and Your Heart -Discuss why it is important to exercise, the FITT principles of exercise, normal and abnormal responses to exercise, and how to exercise safely.   Angina -Discuss definition of angina, causes of angina, treatment of angina, and how to decrease risk of having angina.   Cardiac Medications -Review what the following cardiac medications are used for, how they affect the body, and side effects that may occur when taking the medications.  Medications include Aspirin , Beta blockers, calcium  channel blockers, ACE Inhibitors, angiotensin receptor blockers, diuretics, digoxin, and antihyperlipidemics.   Congestive Heart Failure -Discuss the definition of CHF, how to live with CHF, the signs and symptoms of CHF, and how keep track of weight and  sodium intake.   Heart Disease and Intimacy -Discus the effect sexual activity has on the heart, how changes occur during intimacy as we age, and safety during sexual activity.   Smoking Cessation / COPD -Discuss different methods to quit smoking, the health benefits of quitting smoking, and the definition of COPD.   Nutrition I: Fats -Discuss the types of cholesterol, what cholesterol does to the heart, and how cholesterol levels can be controlled.   Nutrition II:  Labels -Discuss the different components of food labels and how to read food label   Heart Parts/Heart Disease and PAD -Discuss the anatomy of the heart, the pathway of blood circulation through the heart, and these are affected by heart disease.   Stress I: Signs and Symptoms -Discuss the causes of stress, how stress may lead to anxiety and depression, and ways to limit stress.   Stress II: Relaxation -Discuss different types of relaxation techniques to limit stress.   Warning Signs of Stroke / TIA -Discuss definition of a stroke, what the signs and symptoms are of a stroke, and how to identify when someone is having stroke.   Knowledge Questionnaire Score:  Knowledge Questionnaire Score - 09/04/23 1345       Knowledge Questionnaire Score   Pre Score 25/26             Core Components/Risk Factors/Patient Goals at Admission:  Personal Goals and Risk Factors at Admission - 09/04/23 1303       Core Components/Risk Factors/Patient Goals on Admission   Improve shortness of breath with ADL's Yes    Intervention Provide education, individualized exercise plan and daily activity instruction to help decrease symptoms of SOB with activities of daily living.    Expected Outcomes Short Term: Improve cardiorespiratory fitness to achieve a reduction of symptoms when performing ADLs;Long Term: Be able to perform more ADLs without symptoms or delay the onset of symptoms    Hypertension Yes    Intervention Provide education on lifestyle modifcations including regular physical activity/exercise, weight management, moderate sodium restriction and increased consumption of fresh fruit, vegetables, and low fat dairy, alcohol moderation, and smoking cessation.;Monitor prescription use compliance.    Expected Outcomes Short Term: Continued assessment and intervention until BP is < 140/9mm HG in hypertensive participants. < 130/33mm HG in hypertensive participants with diabetes, heart failure or  chronic kidney disease.;Long Term: Maintenance of blood pressure at goal levels.    Lipids Yes    Intervention Provide education and support for participant on nutrition & aerobic/resistive exercise along with prescribed medications to achieve LDL 70mg , HDL >40mg .    Expected Outcomes Long Term: Cholesterol controlled with medications as prescribed, with individualized exercise RX and with personalized nutrition plan. Value goals: LDL < 70mg , HDL > 40 mg.;Short Term: Participant states understanding of desired cholesterol values and is compliant with medications prescribed. Participant is following exercise prescription and nutrition guidelines.    Stress Yes    Intervention Offer individual and/or small group education and counseling on adjustment to heart disease, stress management and health-related lifestyle change. Teach and support self-help strategies.;Refer participants experiencing significant psychosocial distress to appropriate mental health specialists for further evaluation and treatment. When possible, include family members and significant others in education/counseling sessions.    Expected Outcomes Short Term: Participant demonstrates changes in health-related behavior, relaxation and other stress management skills, ability to obtain effective social support, and compliance with psychotropic medications if prescribed.;Long Term: Emotional wellbeing is indicated by absence of clinically significant psychosocial distress or social  isolation.             Core Components/Risk Factors/Patient Goals Review:    Core Components/Risk Factors/Patient Goals at Discharge (Final Review):    ITP Comments:  ITP Comments     Row Name 09/04/23 1320           ITP Comments Patient attend orientation today.  Patient is attending Cardiac Rehabilitation Program.  Documentation for diagnosis can be found in [facility].  Reviewed medical chart, RPE/RPD, gym safety, and program guidelines.  Patient was  fitted to equipment they will be using during rehab.  Patient is scheduled to start exercise on 09/09/23.   Initial ITP created and sent for review and signature by Dr. Armida Lander, Medical Director for Cardiac Rehabilitation Program.                Comments: Initial ITP.

## 2023-09-09 ENCOUNTER — Encounter (HOSPITAL_COMMUNITY)
Admission: RE | Admit: 2023-09-09 | Discharge: 2023-09-09 | Disposition: A | Discharge status: Home / Self Care | Source: Ambulatory Visit | Attending: Cardiology | Admitting: Cardiology

## 2023-09-09 DIAGNOSIS — I25118 Atherosclerotic heart disease of native coronary artery with other forms of angina pectoris: Secondary | ICD-10-CM | POA: Diagnosis not present

## 2023-09-09 NOTE — Progress Notes (Signed)
 Daily Session Note  Patient Details  Name: Courtney White MRN: [id number] Date of Birth: [birth year] Referring Provider:   Flowsheet Row CARDIAC REHAB PHASE II EXERCISE from 09/04/2023 in [facility] CARDIAC REHABILITATION  Referring Provider Fransico Ivy MD       Encounter Date: 09/09/2023  Check In:  Session Check In - 09/09/23 4098       Check-In   Supervising physician immediately available to respond to emergencies See telemetry face sheet for immediately available MD    Location [facility]    Staff Present Jerrol Morelle, BSN, RN, WTA-C;Heather Toy Freund, BS, Exercise Physiologist    Virtual Visit No    Medication changes reported     No    Fall or balance concerns reported    No    Warm-up and Cool-down Performed on first and last piece of equipment    Resistance Training Performed Yes    VAD Patient? No    PAD/SET Patient? No      Pain Assessment   Currently in Pain? No/denies             Capillary Blood Glucose: No results found for this or any previous visit (from the past 24 hours).    Social History   Tobacco Use  Smoking Status Never  Smokeless Tobacco Never    Goals Met:  Exercise tolerated well Personal goals reviewed No report of concerns or symptoms today Strength training completed today  Goals Unmet:  Not Applicable  Comments: First full day of exercise!  Patient was oriented to gym and equipment including functions, settings, policies, and procedures.  Patient's individual exercise prescription and treatment plan were reviewed.  All starting workloads were established based on the results of the 6 minute walk test done at initial orientation visit.  The plan for exercise progression was also introduced and progression will be customized based on patient's performance and goals.

## 2023-09-11 ENCOUNTER — Encounter (HOSPITAL_COMMUNITY)

## 2023-09-11 ENCOUNTER — Encounter (HOSPITAL_COMMUNITY): Payer: Self-pay | Admitting: *Deleted

## 2023-09-11 DIAGNOSIS — I25118 Atherosclerotic heart disease of native coronary artery with other forms of angina pectoris: Secondary | ICD-10-CM

## 2023-09-11 DIAGNOSIS — R0609 Other forms of dyspnea: Secondary | ICD-10-CM

## 2023-09-11 NOTE — Progress Notes (Signed)
 Cardiac Individual Treatment Plan  Patient Details  Name: Courtney White MRN: [id number] Date of Birth: 1955/10/28 Referring Provider:   Flowsheet Row CARDIAC REHAB PHASE II EXERCISE from 09/04/2023 in [facility] CARDIAC REHABILITATION  Referring Provider Fransico Ivy MD       Initial Encounter Date:  Flowsheet Row CARDIAC REHAB PHASE II EXERCISE from 09/04/2023 in [place] CARDIAC REHABILITATION  Date 09/04/23       Visit Diagnosis: Atherosclerosis of native coronary artery of native heart with stable angina pectoris (HCC)  DOE (dyspnea on exertion)  Patient's Home Medications on Admission:  Current Outpatient Medications:    acetaminophen  (TYLENOL ) 325 MG tablet, Take 650 mg by mouth every 6 (six) hours as needed for moderate pain., Disp: , Rfl:    amLODipine  (NORVASC ) 5 MG tablet, Take 1 tablet (5 mg total) by mouth daily., Disp: 100 tablet, Rfl: 3   aspirin  EC (ASPIRIN  LOW DOSE) 81 MG tablet, TAKE 1 TABLET (81 MG TOTAL) BY MOUTH DAILY. SWALLOW WHOLE., Disp: 90 tablet, Rfl: 1   azithromycin  (ZITHROMAX ) 250 MG tablet, Take 2 tablets today, then take 1 tablet daily until gone., Disp: 6 tablet, Rfl: 0   Carboxymethylcellul-Glycerin (LUBRICATING EYE DROPS OP), Place 1 drop into both eyes daily as needed (dry eyes)., Disp: , Rfl:    diclofenac Sodium (VOLTAREN) 1 % GEL, Apply 1 Application topically 3 (three) times daily as needed (joint pain)., Disp: , Rfl:    Elderberry 500 MG CAPS, Take by mouth., Disp: , Rfl:    hydrochlorothiazide  (HYDRODIURIL ) 25 MG tablet, Take 0.5 tablets (12.5 mg total) by mouth daily., Disp: 45 tablet, Rfl: 3   ibuprofen (ADVIL) 200 MG tablet, Take 400 mg by mouth every 8 (eight) hours as needed (pain)., Disp: , Rfl:    levothyroxine  (SYNTHROID ) 112 MCG tablet, Take 1 tablet (112 mcg total) by mouth daily., Disp: 100 tablet, Rfl: 3   metoprolol  succinate (TOPROL -XL) 25 MG 24 hr tablet, Take 1 tablet (25 mg total) by mouth daily. TAKE WITH OR  IMMEDIATELY FOLLOWING A MEAL., Disp: 100 tablet, Rfl: 3   nitroGLYCERIN  (NITROSTAT ) 0.4 MG SL tablet, Place 1 tablet (0.4 mg total) under the tongue every 5 (five) minutes as needed for chest pain., Disp: 25 tablet, Rfl: 3   Ospemifene  (OSPHENA ) 60 MG TABS, Take 1 tablet (60 mg total) by mouth daily., Disp: 90 tablet, Rfl: 3   rosuvastatin  (CRESTOR ) 10 MG tablet, Take 1 tablet (10 mg total) by mouth daily., Disp: 100 tablet, Rfl: 3   Vitamin D , Ergocalciferol , (DRISDOL ) 1.25 MG (50000 UNIT) CAPS capsule, TAKE 1 CAPSULE (50,000 UNITS TOTAL) BY MOUTH EVERY MONDAY., Disp: 12 capsule, Rfl: 3  Current Facility-Administered Medications:    denosumab  (PROLIA ) injection 60 mg, 60 mg, Subcutaneous, Once, Eliodoro Guerin, DO  Past Medical History: Past Medical History:  Diagnosis Date   COVID-19 12/02/2020   Hepatitis C 1992   s/p treatment.  she has cleared virus   HTN (hypertension)    Thyroid  disease    Vitiligo     Tobacco Use: Social History   Tobacco Use  Smoking Status Never  Smokeless Tobacco Never    Labs: Review Flowsheet  More data exists      Latest Ref Rng & Units 07/07/2021 09/26/2022 10/05/2022 12/27/2022 07/26/2023  Labs for ITP Cardiac and Pulmonary Rehab  Cholestrol 100 - 199 mg/dL 914  782  - 956  -  LDL (calc) 0 - 99 mg/dL 71  75  - 69  -  HDL-C >39 mg/dL 56  53  - 55  -  Trlycerides 0 - 149 mg/dL 82  94  - 81  -  Hemoglobin A1c 4.8 - 5.6 % - - 5.6  6.0  6.2     Capillary Blood Glucose: No results found for: "GLUCAP"   Exercise Target Goals: Exercise Program Goal: Individual exercise prescription set using results from initial 6 min walk test and THRR while considering  patient's activity barriers and safety.   Exercise Prescription Goal: Starting with aerobic activity 30 plus minutes a day, 3 days per week for initial exercise prescription. Provide home exercise prescription and guidelines that participant acknowledges understanding prior to  discharge.  Activity Barriers & Risk Stratification:  Activity Barriers & Cardiac Risk Stratification - 09/04/23 1303       Activity Barriers & Cardiac Risk Stratification   Activity Barriers Shortness of Breath;Balance Concerns;Chest Pain/Angina    Cardiac Risk Stratification Moderate             6 Minute Walk:  6 Minute Walk     Row Name 09/04/23 1339         6 Minute Walk   Phase Initial     Distance 1435 feet     Walk Time 6 minutes     # of Rest Breaks 0     MPH 2.72     METS 3.17     RPE 12     Perceived Dyspnea  0     VO2 Peak 11.11     Symptoms No     Resting HR 80 bpm     Resting BP 130/80     Resting Oxygen Saturation  97 %     Exercise Oxygen Saturation  during 6 min walk 98 %     Max Ex. HR 109 bpm     Max Ex. BP 136/82     2 Minute Post BP 128/80              Oxygen Initial Assessment:   Oxygen Re-Evaluation:   Oxygen Discharge (Final Oxygen Re-Evaluation):   Initial Exercise Prescription:  Initial Exercise Prescription - 09/04/23 1300       Date of Initial Exercise RX and Referring Provider   Date 09/04/23    Referring Provider Fransico Ivy MD      Treadmill   MPH 2    Grade 0.5    Minutes 15    METs 2.67      NuStep   Level 2    SPM 50    Minutes 15    METs 2      Prescription Details   Frequency (times per week) 2    Duration Progress to 30 minutes of continuous aerobic without signs/symptoms of physical distress      Intensity   THRR 40-80% of Max Heartrate 109-138    Ratings of Perceived Exertion 11-13    Perceived Dyspnea 0-4      Resistance Training   Training Prescription Yes    Weight 4    Reps 10-15             Perform Capillary Blood Glucose checks as needed.  Exercise Prescription Changes:   Exercise Prescription Changes     Row Name 09/04/23 1300             Response to Exercise   Blood Pressure (Admit) 130/80       Blood Pressure (Exercise) 136/82       Blood  Pressure  (Exit) 128/80       Heart Rate (Admit) 80 bpm       Heart Rate (Exercise) 109 bpm       Heart Rate (Exit) 85 bpm       Oxygen Saturation (Admit) 97 %       Oxygen Saturation (Exercise) 98 %       Oxygen Saturation (Exit) 98 %       Rating of Perceived Exertion (Exercise) 12       Perceived Dyspnea (Exercise) 0                Exercise Comments:   Exercise Comments     Row Name 09/04/23 1324 09/09/23 0938         Exercise Comments Patient states she used to do the silver sneakers program up until about a month ago. Does not do much exercise now. First full day of exercise!  Patient was oriented to gym and equipment including functions, settings, policies, and procedures.  Patient's individual exercise prescription and treatment plan were reviewed.  All starting workloads were established based on the results of the 6 minute walk test done at initial orientation visit.  The plan for exercise progression was also introduced and progression will be customized based on patient's performance and goals.               Exercise Goals and Review:   Exercise Goals     Row Name 09/04/23 1324             Exercise Goals   Increase Physical Activity Yes       Intervention Provide advice, education, support and counseling about physical activity/exercise needs.;Develop an individualized exercise prescription for aerobic and resistive training based on initial evaluation findings, risk stratification, comorbidities and participant's personal goals.       Expected Outcomes Short Term: Attend rehab on a regular basis to increase amount of physical activity.;Long Term: Add in home exercise to make exercise part of routine and to increase amount of physical activity.;Long Term: Exercising regularly at least 3-5 days a week.       Increase Strength and Stamina Yes       Intervention Provide advice, education, support and counseling about physical activity/exercise needs.;Develop an  individualized exercise prescription for aerobic and resistive training based on initial evaluation findings, risk stratification, comorbidities and participant's personal goals.       Expected Outcomes Short Term: Increase workloads from initial exercise prescription for resistance, speed, and METs.;Short Term: Perform resistance training exercises routinely during rehab and add in resistance training at home;Long Term: Improve cardiorespiratory fitness, muscular endurance and strength as measured by increased METs and functional capacity ( )       Able to understand and use rate of perceived exertion (RPE) scale Yes       Intervention Provide education and explanation on how to use RPE scale       Expected Outcomes Short Term: Able to use RPE daily in rehab to express subjective intensity level;Long Term:  Able to use RPE to guide intensity level when exercising independently       Able to understand and use Dyspnea scale Yes       Intervention Provide education and explanation on how to use Dyspnea scale       Expected Outcomes Short Term: Able to use Dyspnea scale daily in rehab to express subjective sense of shortness of breath during exertion;Long Term: Able to use Dyspnea scale  to guide intensity level when exercising independently       Knowledge and understanding of Target Heart Rate Range (THRR) Yes       Intervention Provide education and explanation of THRR including how the numbers were predicted and where they are located for reference       Expected Outcomes Short Term: Able to state/look up THRR;Long Term: Able to use THRR to govern intensity when exercising independently;Short Term: Able to use daily as guideline for intensity in rehab       Able to check pulse independently Yes       Intervention Provide education and demonstration on how to check pulse in carotid and radial arteries.;Review the importance of being able to check your own pulse for safety during independent exercise        Expected Outcomes Short Term: Able to explain why pulse checking is important during independent exercise;Long Term: Able to check pulse independently and accurately       Understanding of Exercise Prescription Yes       Intervention Provide education, explanation, and written materials on patient's individual exercise prescription       Expected Outcomes Short Term: Able to explain program exercise prescription;Long Term: Able to explain home exercise prescription to exercise independently                Exercise Goals Re-Evaluation :  Exercise Goals Re-Evaluation     Row Name 09/09/23 [phone]             Exercise Goal Re-Evaluation   Exercise Goals Review Understanding of Exercise Prescription;Able to understand and use rate of perceived exertion (RPE) scale;Able to understand and use Dyspnea scale;Knowledge and understanding of Target Heart Rate Range (THRR)       Comments Reviewed RPE and dyspnea scale, THR and program prescription with pt today.  Pt voiced understanding and was given a copy of goals to take home.       Expected Outcomes Short: Use RPE daily to regulate intensity.  Long: Follow program prescription in THR.                 Discharge Exercise Prescription (Final Exercise Prescription Changes):  Exercise Prescription Changes - 09/04/23 1300       Response to Exercise   Blood Pressure (Admit) 130/80    Blood Pressure (Exercise) 136/82    Blood Pressure (Exit) 128/80    Heart Rate (Admit) 80 bpm    Heart Rate (Exercise) 109 bpm    Heart Rate (Exit) 85 bpm    Oxygen Saturation (Admit) 97 %    Oxygen Saturation (Exercise) 98 %    Oxygen Saturation (Exit) 98 %    Rating of Perceived Exertion (Exercise) 12    Perceived Dyspnea (Exercise) 0             Nutrition:  Target Goals: Understanding of nutrition guidelines, daily intake of sodium 1500mg , cholesterol 200mg , calories 30% from fat and 7% or less from saturated fats, daily to have 5 or more  servings of fruits and vegetables.  Biometrics:  Pre Biometrics - 09/04/23 1342       Pre Biometrics   Height 5\' 7"  (1.702 m)    Weight 175 lb 14.8 oz (79.8 kg)    Waist Circumference 37 inches    Hip Circumference 41 inches    Waist to Hip Ratio 0.9 %    BMI (Calculated) 27.55    Grip Strength 15.1 kg    Single Leg  Stand 10.4 seconds              Nutrition Therapy Plan and Nutrition Goals:  Nutrition Therapy & Goals - 09/04/23 1302       Intervention Plan   Intervention Prescribe, educate and counsel regarding individualized specific dietary modifications aiming towards targeted core components such as weight, hypertension, lipid management, diabetes, heart failure and other comorbidities.;Nutrition handout(s) given to patient.    Expected Outcomes Short Term Goal: Understand basic principles of dietary content, such as calories, fat, sodium, cholesterol and nutrients.;Short Term Goal: A plan has been developed with personal nutrition goals set during dietitian appointment.;Long Term Goal: Adherence to prescribed nutrition plan.             Nutrition Assessments:  MEDIFICTS Score Key: >=70 Need to make dietary changes  40-70 Heart Healthy Diet <= 40 Therapeutic Level Cholesterol Diet  Flowsheet Row CARDIAC REHAB PHASE II EXERCISE from 09/04/2023 in [facility] CARDIAC REHABILITATION  Picture Your Plate Total Score on Admission 61      Picture Your Plate Scores: <91 Unhealthy dietary pattern with much room for improvement. 41-50 Dietary pattern unlikely to meet recommendations for good health and room for improvement. 51-60 More healthful dietary pattern, with some room for improvement.  >60 Healthy dietary pattern, although there may be some specific behaviors that could be improved.    Nutrition Goals Re-Evaluation:   Nutrition Goals Discharge (Final Nutrition Goals Re-Evaluation):   Psychosocial: Target Goals: Acknowledge presence or absence of  significant depression and/or stress, maximize coping skills, provide positive support system. Participant is able to verbalize types and ability to use techniques and skills needed for reducing stress and depression.  Initial Review & Psychosocial Screening:  Initial Psych Review & Screening - 09/04/23 1301       Initial Review   Current issues with Current Stress Concerns    Source of Stress Concerns Family;Chronic Illness;Unable to perform yard/household activities    Comments Belva has her mother moved in with her who is suffering from dementia so she is experiencing caregiver stress. She also states it takes her longer to do housework than it used to.      Family Dynamics   Good Support System? Yes    Comments Abel states her significant other is her support system. They have been engaged for several years now but have yet to get married.      Barriers   Psychosocial barriers to participate in program The patient should benefit from training in stress management and relaxation.      Screening Interventions   Interventions To provide support and resources with identified psychosocial needs;Encouraged to exercise    Expected Outcomes Short Term goal: Utilizing psychosocial counselor, staff and physician to assist with identification of specific Stressors or current issues interfering with healing process. Setting desired goal for each stressor or current issue identified.;Long Term Goal: Stressors or current issues are controlled or eliminated.;Short Term goal: Identification and review with participant of any Quality of Life or Depression concerns found by scoring the questionnaire.;Long Term goal: The participant improves quality of Life and PHQ9 Scores as seen by post scores and/or verbalization of changes             Quality of Life Scores:  Quality of Life - 09/04/23 1344       Quality of Life   Select Quality of Life      Quality of Life Scores   Health/Function Pre  17.2 %  Socioeconomic Pre 25.94 %    Psych/Spiritual Pre 28.29 %    Family Pre 6.6 %    GLOBAL Pre 19.9 %            Scores of 19 and below usually indicate a poorer quality of life in these areas.  A difference of  2-3 points is a clinically meaningful difference.  A difference of 2-3 points in the total score of the Quality of Life Index has been associated with significant improvement in overall quality of life, self-image, physical symptoms, and general health in studies assessing change in quality of life.  PHQ-9: Review Flowsheet  More data exists      09/04/2023 07/26/2023 03/27/2023 10/05/2022 03/22/2022  Depression screen PHQ 2/9  Decreased Interest 0 0 0 0 0  Down, Depressed, Hopeless 0 0 0 0 0  PHQ - 2 Score 0 0 0 0 0  Altered sleeping 1 0 - 0 -  Tired, decreased energy 1 0 - 0 -  Change in appetite 0 0 - 0 -  Feeling bad or failure about yourself  0 0 - 0 -  Trouble concentrating 0 0 - 0 -  Moving slowly or fidgety/restless 0 0 - 0 -  Suicidal thoughts 0 0 - 0 -  PHQ-9 Score 2 0 - 0 -  Difficult doing work/chores Not difficult at all Not difficult at all - Not difficult at all -   Interpretation of Total Score  Total Score Depression Severity:  1-4 = Minimal depression, 5-9 = Mild depression, 10-14 = Moderate depression, 15-19 = Moderately severe depression, 20-27 = Severe depression   Psychosocial Evaluation and Intervention:  Psychosocial Evaluation - 09/04/23 1302       Psychosocial Evaluation & Interventions   Interventions Stress management education;Encouraged to exercise with the program and follow exercise prescription;Relaxation education    Comments Aminat is a pleasant lady who is eager to start the program because she wants to start feeling better and get her endurance and strength back. Her mobility is good although she says she gets SOB easily and it makes her have coughing spells which then causes her to get dizzy. She states that it takes her longer  to do house and yard work now. Where it used to take her a day, now it may take her several days to do it. She has been experiencing some stress from that and some stress from her mother living with her who suffers from dementia, so she is undergoing some caregiver stress. She states that her significant other is a great support system for her. She doesn't currently do any exercise as about a month ago she was doing silver sneakers but not anymore. She is retired and in her spare time she likes to garden and make floral arrangements.    Expected Outcomes Short: To feel better! Long: Increase strength and stamina.    Continue Psychosocial Services  Follow up required by staff             Psychosocial Re-Evaluation:   Psychosocial Discharge (Final Psychosocial Re-Evaluation):   Vocational Rehabilitation: Provide vocational rehab assistance to qualifying candidates.   Vocational Rehab Evaluation & Intervention:  Vocational Rehab - 09/04/23 1303       Initial Vocational Rehab Evaluation & Intervention   Assessment shows need for Vocational Rehabilitation No             Education: Education Goals: Education classes will be provided on a weekly basis, covering required topics. Participant  will state understanding/return demonstration of topics presented.  Learning Barriers/Preferences:  Learning Barriers/Preferences - 09/04/23 1302       Learning Barriers/Preferences   Learning Barriers None    Learning Preferences Audio;Computer/Internet;Group Instruction;Skilled Demonstration;Verbal Instruction;Video             Education Topics: Hypertension, Hypertension Reduction -Define heart disease and high blood pressure. Discus how high blood pressure affects the body and ways to reduce high blood pressure.   Exercise and Your Heart -Discuss why it is important to exercise, the FITT principles of exercise, normal and abnormal responses to exercise, and how to exercise  safely.   Angina -Discuss definition of angina, causes of angina, treatment of angina, and how to decrease risk of having angina.   Cardiac Medications -Review what the following cardiac medications are used for, how they affect the body, and side effects that may occur when taking the medications.  Medications include Aspirin , Beta blockers, calcium  channel blockers, ACE Inhibitors, angiotensin receptor blockers, diuretics, digoxin, and antihyperlipidemics.   Congestive Heart Failure -Discuss the definition of CHF, how to live with CHF, the signs and symptoms of CHF, and how keep track of weight and sodium intake.   Heart Disease and Intimacy -Discus the effect sexual activity has on the heart, how changes occur during intimacy as we age, and safety during sexual activity.   Smoking Cessation / COPD -Discuss different methods to quit smoking, the health benefits of quitting smoking, and the definition of COPD.   Nutrition I: Fats -Discuss the types of cholesterol, what cholesterol does to the heart, and how cholesterol levels can be controlled.   Nutrition II: Labels -Discuss the different components of food labels and how to read food label   Heart Parts/Heart Disease and PAD -Discuss the anatomy of the heart, the pathway of blood circulation through the heart, and these are affected by heart disease.   Stress I: Signs and Symptoms -Discuss the causes of stress, how stress may lead to anxiety and depression, and ways to limit stress.   Stress II: Relaxation -Discuss different types of relaxation techniques to limit stress.   Warning Signs of Stroke / TIA -Discuss definition of a stroke, what the signs and symptoms are of a stroke, and how to identify when someone is having stroke.   Knowledge Questionnaire Score:  Knowledge Questionnaire Score - 09/04/23 1345       Knowledge Questionnaire Score   Pre Score 25/26             Core Components/Risk  Factors/Patient Goals at Admission:  Personal Goals and Risk Factors at Admission - 09/04/23 1303       Core Components/Risk Factors/Patient Goals on Admission   Improve shortness of breath with ADL's Yes    Intervention Provide education, individualized exercise plan and daily activity instruction to help decrease symptoms of SOB with activities of daily living.    Expected Outcomes Short Term: Improve cardiorespiratory fitness to achieve a reduction of symptoms when performing ADLs;Long Term: Be able to perform more ADLs without symptoms or delay the onset of symptoms    Hypertension Yes    Intervention Provide education on lifestyle modifcations including regular physical activity/exercise, weight management, moderate sodium restriction and increased consumption of fresh fruit, vegetables, and low fat dairy, alcohol moderation, and smoking cessation.;Monitor prescription use compliance.    Expected Outcomes Short Term: Continued assessment and intervention until BP is < 140/60mm HG in hypertensive participants. < 130/89mm HG in hypertensive participants with diabetes, heart  failure or chronic kidney disease.;Long Term: Maintenance of blood pressure at goal levels.    Lipids Yes    Intervention Provide education and support for participant on nutrition & aerobic/resistive exercise along with prescribed medications to achieve LDL 70mg , HDL >40mg .    Expected Outcomes Long Term: Cholesterol controlled with medications as prescribed, with individualized exercise RX and with personalized nutrition plan. Value goals: LDL < 70mg , HDL > 40 mg.;Short Term: Participant states understanding of desired cholesterol values and is compliant with medications prescribed. Participant is following exercise prescription and nutrition guidelines.    Stress Yes    Intervention Offer individual and/or small group education and counseling on adjustment to heart disease, stress management and health-related lifestyle  change. Teach and support self-help strategies.;Refer participants experiencing significant psychosocial distress to appropriate mental health specialists for further evaluation and treatment. When possible, include family members and significant others in education/counseling sessions.    Expected Outcomes Short Term: Participant demonstrates changes in health-related behavior, relaxation and other stress management skills, ability to obtain effective social support, and compliance with psychotropic medications if prescribed.;Long Term: Emotional wellbeing is indicated by absence of clinically significant psychosocial distress or social isolation.             Core Components/Risk Factors/Patient Goals Review:    Core Components/Risk Factors/Patient Goals at Discharge (Final Review):    ITP Comments:  ITP Comments     Row Name 09/04/23 1320 09/09/23 0938 09/11/23 0829       ITP Comments Patient attend orientation today.  Patient is attending Cardiac Rehabilitation Program.  Documentation for diagnosis can be found in [facility].  Reviewed medical chart, RPE/RPD, gym safety, and program guidelines.  Patient was fitted to equipment they will be using during rehab.  Patient is scheduled to start exercise on 09/09/23.   Initial ITP created and sent for review and signature by Dr. Armida Lander, Medical Director for Cardiac Rehabilitation Program. First full day of exercise!  Patient was oriented to gym and equipment including functions, settings, policies, and procedures.  Patient's individual exercise prescription and treatment plan were reviewed.  All starting workloads were established based on the results of the 6 minute walk test done at initial orientation visit.  The plan for exercise progression was also introduced and progression will be customized based on patient's performance and goals. 30 day review completed. ITP sent to Dr. Armida Lander, Medical Director of Cardiac Rehab. Continue with  ITP unless changes are made by physician.    New to program              Comments: 30 day review

## 2023-09-13 ENCOUNTER — Other Ambulatory Visit (HOSPITAL_COMMUNITY): Payer: Self-pay

## 2023-09-17 ENCOUNTER — Encounter: Payer: Self-pay | Admitting: Cardiology

## 2023-09-17 ENCOUNTER — Ambulatory Visit: Attending: Cardiology | Admitting: Cardiology

## 2023-09-17 VITALS — BP 120/82 | HR 87 | Resp 16 | Ht 67.0 in | Wt 176.4 lb

## 2023-09-17 DIAGNOSIS — I2510 Atherosclerotic heart disease of native coronary artery without angina pectoris: Secondary | ICD-10-CM

## 2023-09-17 DIAGNOSIS — R0609 Other forms of dyspnea: Secondary | ICD-10-CM | POA: Diagnosis not present

## 2023-09-17 NOTE — Patient Instructions (Signed)
 Follow-Up: At [facility], you and your health needs are our priority.  As part of our continuing mission to provide you with exceptional heart care, our providers are all part of one team.  This team includes your primary Cardiologist (physician) and Advanced Practice Providers or APPs (Physician Assistants and Nurse Practitioners) who all work together to provide you with the care you need, when you need it.  Your next appointment:   As needed  Provider:   Cody Das, MD

## 2023-09-17 NOTE — Progress Notes (Signed)
 Cardiology Office Note:  .   Date:  09/17/2023  ID:  [name], DOB [birth year], MRN [id number] PCP: Eliodoro Guerin, DO  [facility] Providers Cardiologist:  Fransico Ivy, MD PCP: Eliodoro Guerin, DO  Chief Complaint  Patient presents with   Results   Follow-up   Dyspnea on exertion     Courtney White is a 68 y.o. female with hypertension, hyperlipidemia, mild nonobstructive CAD, h/o partial thyroidectomy, parathyroidectomy  Patient continues to have exertional dyspnea symptoms.  Symptoms have stayed stable for last 3 years without any significant change.  She denies any chest pain.  Reviewed recent PET/CT stress test results with the patient, details below.   Vitals:   09/17/23 1522  BP: 120/82  Pulse: 87  Resp: 16  SpO2: 97%       Review of Systems  Cardiovascular:  Positive for chest pain and dyspnea on exertion. Negative for leg swelling, palpitations and syncope.        Studies Reviewed: Aaron Aas        Labs 12/2022: Chol 140, TG 81, HDL 55, LDL 69 HbA1C 6.0% Hb 13.1 Cr 0.66 3.8  PET/CT stress test 07/2023:   LV perfusion is normal. There is no evidence of ischemia. There is no evidence of infarction.   Rest left ventricular function is normal. Rest EF: 71%. Stress left ventricular function is normal. Stress EF: 72%. End diastolic cavity size is normal.   Myocardial blood flow was computed to be 1.35ml/g/min at rest and 2.66ml/g/min at stress. Global myocardial blood flow reserve was 2.24 and was normal.   Coronary calcium  was present on the attenuation correction CT images. Severe coronary calcifications were present. Coronary calcifications were present in the left anterior descending artery distribution(s).   The study is normal. The study is low risk.    CPEX 07/2022: Normal functional capacity. At peak exercise, patient is limited by reaching her ventilatory limits. The VE/VCO2 slope is moderately elevated and may reflect  elevated pulmonary pressures (diastolic dysfunction) particularly in the setting of a hypertensive response to exercise, however given the low PETCO2, there may also be a component of end-exercise hyperventilation contributing to the high slope.   Coronary angiography 06/26/2022: LM: Normal LAD: Ostial 20%, mid diffuse 40%, mid to distal diffuse 30% disease Lcx: Minimal luminal irregularities RCA: Minimal luminal irregularities   Resting Pd/Pa: 0.80 (Pullback performed after adenosine  effect wore off showed truly diffuse disease with no focal step-up) FFR: 0.80 CFR: 3.1 IMR: 16         Physical Exam Vitals and nursing note reviewed.  Constitutional:      General: She is not in acute distress. Neck:     Vascular: No JVD.  Cardiovascular:     Rate and Rhythm: Normal rate and regular rhythm.     Heart sounds: Normal heart sounds. No murmur heard. Pulmonary:     Effort: Pulmonary effort is normal.     Breath sounds: Normal breath sounds. No wheezing or rales.  Musculoskeletal:     Right lower leg: No edema.     Left lower leg: No edema.      VISIT DIAGNOSES:   ICD-10-CM   1. Coronary artery disease involving native coronary artery of native heart without angina pectoris  I25.10     2. Exertional dyspnea  R06.09         Courtney White is a 68 y.o. female with hypertension, hyperlipidemia, mild nonobstructive CAD, h/o partial thyroidectomy, parathyroidectomy, persistent exertional  dyspnea Assessment & Plan  As needed Mild to moderate nonobstructive CAD noted on coronary angiogram in 06/2023.  PET/CT stress test in 07/2023 again noted normal myocardial blood flow with no evidence of ischemia/infarction.  Cardiopulmonary exercise stress test in 07/2022 suggested may be a component of diastolic dysfunction.  Echocardiogram in 04/2022 had showed normal LVEF, and only grade 1 diastolic dysfunction.  Diastolic function alone is less likely to be because of her symptoms with normal  and diastolic cavity size and normal stress EF on recent PET/CT stress test.  Overall, I think her exertional dyspnea symptoms are out of proportion to about cardiac findings.  I will defer any pulmonary workup, if necessary, to patient's PCP Dr. Bonnell Butcher.  Continue cardiac rehab which may help improve her exercise capacity.  In addition, I agree with continued efforts for weight loss, including consideration for GLP-1 agonist medications, as deconditioning may also be contributing to her exertional dyspnea.  Regardless, I would continue aspirin , statin, metoprolol  succinate, and amlodipine  for medical management of nonobstructive CAD and mixed hyperlipidemia.     F/u as needed  Signed, Cody Das, MD As needed

## 2023-09-18 ENCOUNTER — Encounter (HOSPITAL_COMMUNITY)
Admission: RE | Admit: 2023-09-18 | Discharge: 2023-09-18 | Disposition: A | Discharge status: Home / Self Care | Source: Ambulatory Visit | Attending: Cardiology | Admitting: Cardiology

## 2023-09-18 DIAGNOSIS — I25118 Atherosclerotic heart disease of native coronary artery with other forms of angina pectoris: Secondary | ICD-10-CM | POA: Diagnosis not present

## 2023-09-18 DIAGNOSIS — R0609 Other forms of dyspnea: Secondary | ICD-10-CM

## 2023-09-18 NOTE — Progress Notes (Signed)
 Daily Session Note  Patient Details  Name: Courtney White MRN: [id number] Date of Birth: 1955-12-23 Referring Provider:   Flowsheet Row CARDIAC REHAB PHASE II EXERCISE from 09/04/2023 in [facility] CARDIAC REHABILITATION  Referring Provider Fransico Ivy MD       Encounter Date: 09/18/2023  Check In:  Session Check In - 09/18/23 0915       Check-In   Supervising physician immediately available to respond to emergencies See telemetry face sheet for immediately available MD    Location [facility]    Staff Present Rita Cherry, MA, RCEP, CCRP, CCET;Annalei Friesz Toy Freund, [place], Exercise Physiologist    Virtual Visit No    Medication changes reported     No    Fall or balance concerns reported    No    Tobacco Cessation No Change    Warm-up and Cool-down Performed on first and last piece of equipment    Resistance Training Performed Yes    VAD Patient? No    PAD/SET Patient? No      Pain Assessment   Currently in Pain? No/denies    Multiple Pain Sites No             Capillary Blood Glucose: No results found for this or any previous visit (from the past 24 hours).    Social History   Tobacco Use  Smoking Status Never  Smokeless Tobacco Never    Goals Met:  Independence with exercise equipment Exercise tolerated well No report of concerns or symptoms today Strength training completed today  Goals Unmet:  Not Applicable  Comments: Pt able to follow exercise prescription today without complaint.  Will continue to monitor for progression.

## 2023-09-23 ENCOUNTER — Encounter (HOSPITAL_COMMUNITY)
Admission: RE | Admit: 2023-09-23 | Discharge: 2023-09-23 | Disposition: A | Discharge status: Home / Self Care | Source: Ambulatory Visit | Attending: Cardiology | Admitting: Cardiology

## 2023-09-23 DIAGNOSIS — I25118 Atherosclerotic heart disease of native coronary artery with other forms of angina pectoris: Secondary | ICD-10-CM | POA: Diagnosis present

## 2023-09-23 DIAGNOSIS — R0609 Other forms of dyspnea: Secondary | ICD-10-CM | POA: Diagnosis present

## 2023-09-23 NOTE — Progress Notes (Signed)
 Daily Session Note  Patient Details  Name: Courtney White MRN: [id number] Date of Birth: 1955-10-08 Referring Provider:   Flowsheet Row CARDIAC REHAB PHASE II EXERCISE from 09/04/2023 in [facility] CARDIAC REHABILITATION  Referring Provider Fransico Ivy MD       Encounter Date: 09/23/2023  Check In:  Session Check In - 09/23/23 0924       Check-In   Supervising physician immediately available to respond to emergencies See telemetry face sheet for immediately available MD    Location [facility]    Staff Present Jerrol Morelle, BSN, RN, WTA-C;Heather Toy Freund, BS, Exercise Physiologist    Virtual Visit No    Medication changes reported     No    Fall or balance concerns reported    No    Tobacco Cessation No Change    Warm-up and Cool-down Performed on first and last piece of equipment    Resistance Training Performed Yes    VAD Patient? No    PAD/SET Patient? No      Pain Assessment   Currently in Pain? No/denies             Capillary Blood Glucose: No results found for this or any previous visit (from the past 24 hours).    Social History   Tobacco Use  Smoking Status Never  Smokeless Tobacco Never    Goals Met:  Independence with exercise equipment Exercise tolerated well No report of concerns or symptoms today Strength training completed today  Goals Unmet:  Not Applicable  Comments: Pt able to follow exercise prescription today without complaint.  Will continue to monitor for progression.

## 2023-09-25 ENCOUNTER — Encounter (HOSPITAL_COMMUNITY)

## 2023-09-26 ENCOUNTER — Other Ambulatory Visit (HOSPITAL_COMMUNITY): Payer: Self-pay

## 2023-09-26 NOTE — Telephone Encounter (Signed)
 The appeal for Prolia  has been approved by the insurance:    Thank you, Dene Fines, PharmD Clinical Pharmacist  [place]  Direct Dial: [phone]

## 2023-09-26 NOTE — Telephone Encounter (Signed)
 Pt ready for scheduling for PROLIA  on or after : 09/26/23  Option# 1: Buy/Bill (Office supplied medication)  Out-of-pocket cost due at time of clinic visit: $357  Number of injection/visits approved: 2  Primary: [employer]-MEDICARE Prolia  co-insurance: 20% Admin fee co-insurance: 20%  Secondary: --- Prolia  co-insurance:  Admin fee co-insurance:   Medical Benefit Details: Date Benefits were checked: 08/21/23 Deductible: $250 Met of $250 Required/ Coinsurance: 20%/ Admin Fee: 20%  Prior Auth: APPROVED PA# [id number] Expiration Date: 08/29/23-09/02/24  # of doses approved: 2 ----------------------------------------------------------------------- Option# 2- Med Obtained from pharmacy:  Pharmacy benefit: Copay $--- (Paid to pharmacy) Admin Fee: --- (Pay at clinic)  Prior Auth: --- PA# Expiration Date:   # of doses approved:   If patient wants fill through the pharmacy benefit please send prescription to: ---, and include estimated need by date in rx notes. Pharmacy will ship medication directly to the office.  Patient NOT eligible for Prolia  Copay Card. Copay Card can make patient's cost as little as $25. Link to apply: [email or web address]://www.amgensupportplus.[email or web address]  ** This summary of benefits is an estimation of the patient's out-of-pocket cost. Exact cost may very based on individual plan coverage.

## 2023-09-27 ENCOUNTER — Encounter: Payer: Self-pay | Admitting: Pharmacist

## 2023-09-27 ENCOUNTER — Ambulatory Visit: Attending: Cardiology | Admitting: Pharmacist

## 2023-09-27 DIAGNOSIS — E669 Obesity, unspecified: Secondary | ICD-10-CM | POA: Insufficient documentation

## 2023-09-27 DIAGNOSIS — E663 Overweight: Secondary | ICD-10-CM

## 2023-09-27 MED ORDER — ZEPBOUND 2.5 MG/0.5ML ~~LOC~~ SOLN: 2.5000 mg | SUBCUTANEOUS | 0 refills | Status: AC

## 2023-09-27 NOTE — Patient Instructions (Signed)

## 2023-09-27 NOTE — Progress Notes (Signed)
 Patient ID: Courtney White                 DOB: 1956-03-29                    MRN: [id number]     HPI: Courtney White is a 68 y.o. female patient referred to pharmacy clinic by Patwardhan to initiate GLP1-RA therapy. PMH is significant for hypertension, hyperlipidemia, mild nonobstructive CAD, h/o partial thyroidectomy, parathyroidectomy and over weight. Most recent BMI 27.6.  Patient presents today to clinic. She states that she had been participating in silver sneakers before starting cardiac rehab. She tries to follow a Mediterranean diet. Previously weighed her portions of food.  Unfortunately, insurance will not cover Wegovy  or Zepbound. Pt interested in cash price options. She used to work for [employer]- had wanted to go with [employer] , but they do not have cash price options for medicare pt.   Diet:  Breakfast: great grains and fruit Lunch: small salad-ham oil and vingear  Dinner: fish w/ salad (mixed greens, cucumbers, tomatoes) Drink: water, sometimes 1/2 1/2 tea Snack: pretzels, fruit, nuts, very little sweets  Exercise: cardiac rehab, silver sneakers (used to do before rehab). Walks on treadmill at home  Family History:  Family History  Problem Relation Age of Onset   Diabetes Mother    Depression Mother    Hypertension Mother    Lung cancer Father    Hypertension Sister    Hypertension Sister    Thyroid  disease Maternal Aunt     Social History: rare ETOH, no tobacco  Labs: Lab Results  Component Value Date   HGBA1C 6.2 (H) 07/26/2023    Wt Readings from Last 1 Encounters:  09/17/23 176 lb 6.4 oz (80 kg)    BP Readings from Last 1 Encounters:  09/17/23 120/82   Pulse Readings from Last 1 Encounters:  09/17/23 87       Component Value Date/Time   CHOL 140 12/27/2022 0945   TRIG 81 12/27/2022 0945   HDL 55 12/27/2022 0945   CHOLHDL 2.5 12/27/2022 0945   LDLCALC 69 12/27/2022 0945    Past Medical History:  Diagnosis Date   COVID-19 12/02/2020    Hepatitis C 1992   s/p treatment.  she has cleared virus   HTN (hypertension)    Thyroid  disease    Vitiligo     Current Outpatient Medications on File Prior to Visit  Medication Sig Dispense Refill   acetaminophen  (TYLENOL ) 325 MG tablet Take 650 mg by mouth every 6 (six) hours as needed for moderate pain.     amLODipine  (NORVASC ) 5 MG tablet Take 1 tablet (5 mg total) by mouth daily. 100 tablet 3   aspirin  EC (ASPIRIN  LOW DOSE) 81 MG tablet TAKE 1 TABLET (81 MG TOTAL) BY MOUTH DAILY. SWALLOW WHOLE. 90 tablet 1   Carboxymethylcellul-Glycerin (LUBRICATING EYE DROPS OP) Place 1 drop into both eyes daily as needed (dry eyes).     diclofenac Sodium (VOLTAREN) 1 % GEL Apply 1 Application topically 3 (three) times daily as needed (joint pain).     hydrochlorothiazide  (HYDRODIURIL ) 25 MG tablet Take 0.5 tablets (12.5 mg total) by mouth daily. 45 tablet 3   ibuprofen (ADVIL) 200 MG tablet Take 400 mg by mouth every 8 (eight) hours as needed (pain).     levothyroxine  (SYNTHROID ) 112 MCG tablet Take 1 tablet (112 mcg total) by mouth daily. 100 tablet 3   metoprolol  succinate (TOPROL -XL) 25 MG 24 hr tablet  Take 1 tablet (25 mg total) by mouth daily. TAKE WITH OR IMMEDIATELY FOLLOWING A MEAL. 100 tablet 3   nitroGLYCERIN  (NITROSTAT ) 0.4 MG SL tablet Place 1 tablet (0.4 mg total) under the tongue every 5 (five) minutes as needed for chest pain. 25 tablet 3   rosuvastatin  (CRESTOR ) 10 MG tablet Take 1 tablet (10 mg total) by mouth daily. 100 tablet 3   Vitamin D , Ergocalciferol , (DRISDOL ) 1.25 MG (50000 UNIT) CAPS capsule TAKE 1 CAPSULE (50,000 UNITS TOTAL) BY MOUTH EVERY MONDAY. 12 capsule 3   Current Facility-Administered Medications on File Prior to Visit  Medication Dose Route Frequency Provider Last Rate Last Admin   denosumab  (PROLIA ) injection 60 mg  60 mg Subcutaneous Once Gottschalk, Ashly M, DO        No Known Allergies   Assessment/Plan:  1. Weight loss - Patient has not met goal of  at least 5% of body weight loss with comprehensive lifestyle modifications alone in the past 3-6 months. Pharmacotherapy is appropriate to pursue as augmentation. Will start Zepbound 2.5mg  weekly. Confirmed patient not pregnant and no personal or family history of medullary thyroid  carcinoma (MTC) or Multiple Endocrine Neoplasia syndrome type 2 (MEN 2). Injection technique reviewed at today's visit. No hx of pancreatitis or gallstones.   Advised patient on common side effects including nausea, diarrhea, dyspepsia, decreased appetite, and fatigue. Counseled patient on reducing meal size and how to titrate medication to minimize side effects. Counseled patient to call if intolerable side effects or if experiencing dehydration, abdominal pain, or dizziness. Patient will adhere to dietary modifications and will target at least 150 minutes of moderate intensity exercise weekly. Reminded patient that medication does not distinguish between fat and muscle. Stressed the importance of Emergency planning/management officer.   Injection technique reviewed.  Follow up in 1 month via telephone for tolerability update and dose titration.   Courtney White, Pharm.Monika Annas, CPP [facility] A Division of [facility] [address] [place], [place] [address]  Phone: [phone]; Fax: [phone]

## 2023-09-30 ENCOUNTER — Encounter (HOSPITAL_COMMUNITY)

## 2023-10-01 ENCOUNTER — Other Ambulatory Visit (HOSPITAL_COMMUNITY)

## 2023-10-01 NOTE — Telephone Encounter (Signed)
 Notified patient on cost - she will call back to schedule she is currently out of town

## 2023-10-02 ENCOUNTER — Encounter (HOSPITAL_COMMUNITY)

## 2023-10-07 ENCOUNTER — Encounter (HOSPITAL_COMMUNITY)
Admission: RE | Admit: 2023-10-07 | Discharge: 2023-10-07 | Disposition: A | Discharge status: Home / Self Care | Source: Ambulatory Visit | Attending: Cardiology | Admitting: Cardiology

## 2023-10-09 ENCOUNTER — Encounter (HOSPITAL_COMMUNITY)
Admission: RE | Admit: 2023-10-09 | Discharge: 2023-10-09 | Disposition: A | Discharge status: Home / Self Care | Source: Ambulatory Visit | Attending: Cardiology

## 2023-10-09 DIAGNOSIS — I25118 Atherosclerotic heart disease of native coronary artery with other forms of angina pectoris: Secondary | ICD-10-CM

## 2023-10-09 DIAGNOSIS — R0609 Other forms of dyspnea: Secondary | ICD-10-CM

## 2023-10-09 NOTE — Progress Notes (Signed)
 Cardiac Individual Treatment Plan   Patient Details  Name: Courtney White MRN: [id number] Date of Birth: 04/26/1955 Referring Provider:   Flowsheet Row CARDIAC REHAB PHASE II EXERCISE from 09/04/2023 in [place] C[facility]IAC [facility]  Referring Provider Fransico Ivy MD    Initial Encounter Date:  Flowsheet Row CARDIAC REHAB PHASE II EXERCISE from 09/04/2023 in [place] CARDIAC REHABILITATION  Date 09/04/23    Visit Diagnosis: No diagnosis found.  Patient's Home Medications on Admission:  Current Outpatient Medications:    acetaminophen  (TYLENOL ) 325 MG tablet, Take 650 mg by mouth every 6 (six) hours as needed for moderate pain., Disp: , Rfl:    amLODipine  (NORVASC ) 5 MG tablet, Take 1 tablet (5 mg total) by mouth daily., Disp: 100 tablet, Rfl: 3   aspirin  EC (ASPIRIN  LOW DOSE) 81 MG tablet, TAKE 1 TABLET (81 MG TOTAL) BY MOUTH DAILY. SWALLOW WHOLE., Disp: 90 tablet, Rfl: 1   Carboxymethylcellul-Glycerin (LUBRICATING EYE DROPS OP), Place 1 drop into both eyes daily as needed (dry eyes)., Disp: , Rfl:    diclofenac Sodium (VOLTAREN) 1 % GEL, Apply 1 Application topically 3 (three) times daily as needed (joint pain)., Disp: , Rfl:    hydrochlorothiazide  (HYDRODIURIL ) 25 MG tablet, Take 0.5 tablets (12.5 mg total) by mouth daily., Disp: 45 tablet, Rfl: 3   ibuprofen (ADVIL) 200 MG tablet, Take 400 mg by mouth every 8 (eight) hours as needed (pain)., Disp: , Rfl:    levothyroxine  (SYNTHROID ) 112 MCG tablet, Take 1 tablet (112 mcg total) by mouth daily., Disp: 100 tablet, Rfl: 3   metoprolol  succinate (TOPROL -XL) 25 MG 24 hr tablet, Take 1 tablet (25 mg total) by mouth daily. TAKE WITH OR IMMEDIATELY FOLLOWING A MEAL., Disp: 100 tablet, Rfl: 3   nitroGLYCERIN  (NITROSTAT ) 0.4 MG SL tablet, Place 1 tablet (0.4 mg total) under the tongue every 5 (five) minutes as needed for chest pain., Disp: 25 tablet, Rfl: 3   rosuvastatin  (CRESTOR ) 10 MG tablet, Take 1 tablet (10 mg total) by  mouth daily., Disp: 100 tablet, Rfl: 3   tirzepatide  (ZEPBOUND ) 2.5 MG/0.5ML injection vial, Inject 2.5 mg into the skin once a week., Disp: 2 mL, Rfl: 0   Vitamin D , Ergocalciferol , (DRISDOL ) 1.25 MG (50000 UNIT) CAPS capsule, TAKE 1 CAPSULE (50,000 UNITS TOTAL) BY MOUTH EVERY MONDAY., Disp: 12 capsule, Rfl: 3  Current Facility-Administered Medications:    denosumab  (PROLIA ) injection 60 mg, 60 mg, Subcutaneous, Once, Eliodoro Guerin, DO  Past Medical History: Past Medical History:  Diagnosis Date   COVID-19 12/02/2020   Hepatitis C 1992   s/p treatment.  she has cleared virus   HTN (hypertension)    Thyroid  disease    Vitiligo     Tobacco Use: Social History   Tobacco Use  Smoking Status Never  Smokeless Tobacco Never    Labs: Review Flowsheet  More data exists      Latest Ref Rng & Units 07/07/2021 09/26/2022 10/05/2022 12/27/2022 07/26/2023  Labs for ITP Cardiac and Pulmonary Rehab  Cholestrol 100 - 199 mg/dL 409  811  - 914  -  LDL (calc) 0 - 99 mg/dL 71  75  - 69  -  HDL-C >39 mg/dL 56  53  - 55  -  Trlycerides 0 - 149 mg/dL 82  94  - 81  -  Hemoglobin A1c 4.8 - 5.6 % - - 5.6  6.0  6.2     Capillary Blood Glucose: No results found for: GLUCAP   Exercise  Target Goals: Exercise Program Goal: Individual exercise prescription set using results from initial 6 min walk test and THRR while considering  patient's activity barriers and safety.   Exercise Prescription Goal: Starting with aerobic activity 30 plus minutes a day, 3 days per week for initial exercise prescription. Provide home exercise prescription and guidelines that participant acknowledges understanding prior to discharge.  Activity Barriers & Risk Stratification:  Activity Barriers & Cardiac Risk Stratification - 09/04/23 1303       Activity Barriers & Cardiac Risk Stratification   Activity Barriers Shortness of Breath;Balance Concerns;Chest Pain/Angina    Cardiac Risk Stratification Moderate           6 Minute Walk:  6 Minute Walk     Row Name 09/04/23 1339         6 Minute Walk   Phase Initial     Distance 1435 feet     Walk Time 6 minutes     # of Rest Breaks 0     MPH 2.72     METS 3.17     RPE 12     Perceived Dyspnea  0     VO2 Peak 11.11     Symptoms No     Resting HR 80 bpm     Resting BP 130/80     Resting Oxygen Saturation  97 %     Exercise Oxygen Saturation  during 6 min walk 98 %     Max Ex. HR 109 bpm     Max Ex. BP 136/82     2 Minute Post BP 128/80        Oxygen Initial Assessment:   Oxygen Re-Evaluation:   Oxygen Discharge (Final Oxygen Re-Evaluation):   Initial Exercise Prescription:  Initial Exercise Prescription - 09/04/23 1300       Date of Initial Exercise RX and Referring Provider   Date 09/04/23    Referring Provider Fransico Ivy MD      Treadmill   MPH 2    Grade 0.5    Minutes 15    METs 2.67      NuStep   Level 2    SPM 50    Minutes 15    METs 2      Prescription Details   Frequency (times per week) 2    Duration Progress to 30 minutes of continuous aerobic without signs/symptoms of physical distress      Intensity   THRR 40-80% of Max Heartrate 109-138    Ratings of Perceived Exertion 11-13    Perceived Dyspnea 0-4      Resistance Training   Training Prescription Yes    Weight 4    Reps 10-15          Perform Capillary Blood Glucose checks as needed.  Exercise Prescription Changes:   Exercise Prescription Changes     Row Name 09/04/23 1300 09/18/23 1300           Response to Exercise   Blood Pressure (Admit) 130/80 126/60      Blood Pressure (Exercise) 136/82 132/60      Blood Pressure (Exit) 128/80 108/60      Heart Rate (Admit) 80 bpm 81 bpm      Heart Rate (Exercise) 109 bpm 114 bpm      Heart Rate (Exit) 85 bpm 101 bpm      Oxygen Saturation (Admit) 97 % --      Oxygen Saturation (Exercise) 98 % --  Oxygen Saturation (Exit) 98 % --      Rating of Perceived Exertion  (Exercise) 12 12      Perceived Dyspnea (Exercise) 0 0      Duration -- Continue with 30 min of aerobic exercise without signs/symptoms of physical distress.      Intensity -- THRR unchanged        Progression   Progression -- Continue to progress workloads to maintain intensity without signs/symptoms of physical distress.        Resistance Training   Training Prescription -- Yes      Weight -- 4      Reps -- 10-15        Treadmill   MPH -- 3      Grade -- 0.5      Minutes -- 15      METs -- 3.5        NuStep   Level -- 2      SPM -- 102      Minutes -- 15      METs -- 3.1         Exercise Comments:   Exercise Comments     Row Name 09/04/23 1324 09/09/23 0938         Exercise Comments Patient states she used to do the silver sneakers program up until about a month ago. Does not do much exercise now. First full day of exercise!  Patient was oriented to gym and equipment including functions, settings, policies, and procedures.  Patient's individual exercise prescription and treatment plan were reviewed.  All starting workloads were established based on the results of the 6 minute walk test done at initial orientation visit.  The plan for exercise progression was also introduced and progression will be customized based on patient's performance and goals.         Exercise Goals and Review:   Exercise Goals     Row Name 09/04/23 1324             Exercise Goals   Increase Physical Activity Yes       Intervention Provide advice, education, support and counseling about physical activity/exercise needs.;Develop an individualized exercise prescription for aerobic and resistive training based on initial evaluation findings, risk stratification, comorbidities and participant's personal goals.       Expected Outcomes Short Term: Attend rehab on a regular basis to increase amount of physical activity.;Long Term: Add in home exercise to make exercise part of routine and to  increase amount of physical activity.;Long Term: Exercising regularly at least 3-5 days a week.       Increase Strength and Stamina Yes       Intervention Provide advice, education, support and counseling about physical activity/exercise needs.;Develop an individualized exercise prescription for aerobic and resistive training based on initial evaluation findings, risk stratification, comorbidities and participant's personal goals.       Expected Outcomes Short Term: Increase workloads from initial exercise prescription for resistance, speed, and METs.;Short Term: Perform resistance training exercises routinely during rehab and add in resistance training at home;Long Term: Improve cardiorespiratory fitness, muscular endurance and strength as measured by increased METs and functional capacity ( )       Able to understand and use rate of perceived exertion (RPE) scale Yes       Intervention Provide education and explanation on how to use RPE scale       Expected Outcomes Short Term: Able to use RPE daily in rehab to express  subjective intensity level;Long Term:  Able to use RPE to guide intensity level when exercising independently       Able to understand and use Dyspnea scale Yes       Intervention Provide education and explanation on how to use Dyspnea scale       Expected Outcomes Short Term: Able to use Dyspnea scale daily in rehab to express subjective sense of shortness of breath during exertion;Long Term: Able to use Dyspnea scale to guide intensity level when exercising independently       Knowledge and understanding of Target Heart Rate Range (THRR) Yes       Intervention Provide education and explanation of THRR including how the numbers were predicted and where they are located for reference       Expected Outcomes Short Term: Able to state/look up THRR;Long Term: Able to use THRR to govern intensity when exercising independently;Short Term: Able to use daily as guideline for intensity in  rehab       Able to check pulse independently Yes       Intervention Provide education and demonstration on how to check pulse in carotid and radial arteries.;Review the importance of being able to check your own pulse for safety during independent exercise       Expected Outcomes Short Term: Able to explain why pulse checking is important during independent exercise;Long Term: Able to check pulse independently and accurately       Understanding of Exercise Prescription Yes       Intervention Provide education, explanation, and written materials on patient's individual exercise prescription       Expected Outcomes Short Term: Able to explain program exercise prescription;Long Term: Able to explain home exercise prescription to exercise independently          Exercise Goals Re-Evaluation :  Exercise Goals Re-Evaluation     Row Name 09/09/23 [phone]             Exercise Goal Re-Evaluation   Exercise Goals Review Understanding of Exercise Prescription;Able to understand and use rate of perceived exertion (RPE) scale;Able to understand and use Dyspnea scale;Knowledge and understanding of Target Heart Rate Range (THRR)       Comments Reviewed RPE and dyspnea scale, THR and program prescription with pt today.  Pt voiced understanding and was given a copy of goals to take home.       Expected Outcomes Short: Use RPE daily to regulate intensity.  Long: Follow program prescription in THR.           Discharge Exercise Prescription (Final Exercise Prescription Changes):  Exercise Prescription Changes - 09/18/23 1300       Response to Exercise   Blood Pressure (Admit) 126/60    Blood Pressure (Exercise) 132/60    Blood Pressure (Exit) 108/60    Heart Rate (Admit) 81 bpm    Heart Rate (Exercise) 114 bpm    Heart Rate (Exit) 101 bpm    Rating of Perceived Exertion (Exercise) 12    Perceived Dyspnea (Exercise) 0    Duration Continue with 30 min of aerobic exercise without signs/symptoms of  physical distress.    Intensity THRR unchanged      Progression   Progression Continue to progress workloads to maintain intensity without signs/symptoms of physical distress.      Resistance Training   Training Prescription Yes    Weight 4    Reps 10-15      Treadmill   MPH 3  Grade 0.5    Minutes 15    METs 3.5      NuStep   Level 2    SPM 102    Minutes 15    METs 3.1          Nutrition:  Target Goals: Understanding of nutrition guidelines, daily intake of sodium 1500mg , cholesterol 200mg , calories 30% from fat and 7% or less from saturated fats, daily to have 5 or more servings of fruits and vegetables.  Biometrics:  Pre Biometrics - 09/04/23 1342       Pre Biometrics   Height 5' 7 (1.702 m)    Weight 79.8 kg    Waist Circumference 37 inches    Hip Circumference 41 inches    Waist to Hip Ratio 0.9 %    BMI (Calculated) 27.55    Grip Strength 15.1 kg    Single Leg Stand 10.4 seconds           Nutrition Therapy Plan and Nutrition Goals:  Nutrition Therapy & Goals - 09/04/23 1302       Intervention Plan   Intervention Prescribe, educate and counsel regarding individualized specific dietary modifications aiming towards targeted core components such as weight, hypertension, lipid management, diabetes, heart failure and other comorbidities.;Nutrition handout(s) given to patient.    Expected Outcomes Short Term Goal: Understand basic principles of dietary content, such as calories, fat, sodium, cholesterol and nutrients.;Short Term Goal: A plan has been developed with personal nutrition goals set during dietitian appointment.;Long Term Goal: Adherence to prescribed nutrition plan.          Nutrition Assessments:  MEDIFICTS Score Key: >=70 Need to make dietary changes  40-70 Heart Healthy Diet <= 40 Therapeutic Level Cholesterol Diet  Flowsheet Row CARDIAC REHAB PHASE II EXERCISE from 09/04/2023 in [facility]  Picture Your  Plate Total Score on Admission 61   Picture Your Plate Scores: <78 Unhealthy dietary pattern with much room for improvement. 41-50 Dietary pattern unlikely to meet recommendations for good health and room for improvement. 51-60 More healthful dietary pattern, with some room for improvement.  >60 Healthy dietary pattern, although there may be some specific behaviors that could be improved.    Nutrition Goals Re-Evaluation:   Nutrition Goals Discharge (Final Nutrition Goals Re-Evaluation):   Psychosocial: Target Goals: Acknowledge presence or absence of significant depression and/or stress, maximize coping skills, provide positive support system. Participant is able to verbalize types and ability to use techniques and skills needed for reducing stress and depression.  Initial Review & Psychosocial Screening:  Initial Psych Review & Screening - 09/04/23 1301       Initial Review   Current issues with Current Stress Concerns    Source of Stress Concerns Family;Chronic Illness;Unable to perform yard/household activities    Comments Nidia has her mother moved in with her who is suffering from dementia so she is experiencing caregiver stress. She also states it takes her longer to do housework than it used to.      Family Dynamics   Good Support System? Yes    Comments Alania states her significant other is her support system. They have been engaged for several years now but have yet to get married.      Barriers   Psychosocial barriers to participate in program The patient should benefit from training in stress management and relaxation.      Screening Interventions   Interventions To provide support and resources with identified psychosocial needs;Encouraged to exercise  Expected Outcomes Short Term goal: Utilizing psychosocial counselor, staff and physician to assist with identification of specific Stressors or current issues interfering with healing process. Setting desired goal  for each stressor or current issue identified.;Long Term Goal: Stressors or current issues are controlled or eliminated.;Short Term goal: Identification and review with participant of any Quality of Life or Depression concerns found by scoring the questionnaire.;Long Term goal: The participant improves quality of Life and PHQ9 Scores as seen by post scores and/or verbalization of changes          Quality of Life Scores:  Quality of Life - 09/04/23 1344       Quality of Life   Select Quality of Life      Quality of Life Scores   Health/Function Pre 17.2 %    Socioeconomic Pre 25.94 %    Psych/Spiritual Pre 28.29 %    Family Pre 6.6 %    GLOBAL Pre 19.9 %         Scores of 19 and below usually indicate a poorer quality of life in these areas.  A difference of  2-3 points is a clinically meaningful difference.  A difference of 2-3 points in the total score of the Quality of Life Index has been associated with significant improvement in overall quality of life, self-image, physical symptoms, and general health in studies assessing change in quality of life.  PHQ-9: Review Flowsheet  More data exists      09/04/2023 07/26/2023 03/27/2023 10/05/2022 03/22/2022  Depression screen PHQ 2/9  Decreased Interest 0 0 0 0 0  Down, Depressed, Hopeless 0 0 0 0 0  PHQ - 2 Score 0 0 0 0 0  Altered sleeping 1 0 - 0 -  Tired, decreased energy 1 0 - 0 -  Change in appetite 0 0 - 0 -  Feeling bad or failure about yourself  0 0 - 0 -  Trouble concentrating 0 0 - 0 -  Moving slowly or fidgety/restless 0 0 - 0 -  Suicidal thoughts 0 0 - 0 -  PHQ-9 Score 2 0 - 0 -  Difficult doing work/chores Not difficult at all Not difficult at all - Not difficult at all -   Interpretation of Total Score  Total Score Depression Severity:  1-4 = Minimal depression, 5-9 = Mild depression, 10-14 = Moderate depression, 15-19 = Moderately severe depression, 20-27 = Severe depression   Psychosocial Evaluation and  Intervention:  Psychosocial Evaluation - 09/04/23 1302       Psychosocial Evaluation & Interventions   Interventions Stress management education;Encouraged to exercise with the program and follow exercise prescription;Relaxation education    Comments Shaya is a pleasant lady who is eager to start the program because she wants to start feeling better and get her endurance and strength back. Her mobility is good although she says she gets SOB easily and it makes her have coughing spells which then causes her to get dizzy. She states that it takes her longer to do house and yard work now. Where it used to take her a day, now it may take her several days to do it. She has been experiencing some stress from that and some stress from her mother living with her who suffers from dementia, so she is undergoing some caregiver stress. She states that her significant other is a great support system for her. She doesn't currently do any exercise as about a month ago she was doing silver sneakers but not anymore.  She is retired and in her spare time she likes to garden and make floral arrangements.    Expected Outcomes Short: To feel better! Long: Increase strength and stamina.    Continue Psychosocial Services  Follow up required by staff          Psychosocial Re-Evaluation:   Psychosocial Discharge (Final Psychosocial Re-Evaluation):   Vocational Rehabilitation: Provide vocational rehab assistance to qualifying candidates.   Vocational Rehab Evaluation & Intervention:  Vocational Rehab - 09/04/23 1303       Initial Vocational Rehab Evaluation & Intervention   Assessment shows need for Vocational Rehabilitation No          Education: Education Goals: Education classes will be provided on a weekly basis, covering required topics. Participant will state understanding/return demonstration of topics presented.  Learning Barriers/Preferences:  Learning Barriers/Preferences - 09/04/23 1302        Learning Barriers/Preferences   Learning Barriers None    Learning Preferences Audio;Computer/Internet;Group Instruction;Skilled Demonstration;Verbal Instruction;Video          Education Topics: Hypertension, Hypertension Reduction -Define heart disease and high blood pressure. Discus how high blood pressure affects the body and ways to reduce high blood pressure.   Exercise and Your Heart -Discuss why it is important to exercise, the FITT principles of exercise, normal and abnormal responses to exercise, and how to exercise safely.   Angina -Discuss definition of angina, causes of angina, treatment of angina, and how to decrease risk of having angina.   Cardiac Medications -Review what the following cardiac medications are used for, how they affect the body, and side effects that may occur when taking the medications.  Medications include Aspirin , Beta blockers, calcium  channel blockers, ACE Inhibitors, angiotensin receptor blockers, diuretics, digoxin, and antihyperlipidemics. Flowsheet Row CARDIAC REHAB PHASE II EXERCISE from 09/18/2023 in [place] CARDIAC REHABILITATION  Date 09/18/23  Educator jh  Instruction Review Code 1- Verbalizes Understanding    Congestive Heart Failure -Discuss the definition of CHF, how to live with CHF, the signs and symptoms of CHF, and how keep track of weight and sodium intake.   Heart Disease and Intimacy -Discus the effect sexual activity has on the heart, how changes occur during intimacy as we age, and safety during sexual activity.   Smoking Cessation / COPD -Discuss different methods to quit smoking, the health benefits of quitting smoking, and the definition of COPD.   Nutrition I: Fats -Discuss the types of cholesterol, what cholesterol does to the heart, and how cholesterol levels can be controlled.   Nutrition II: Labels -Discuss the different components of food labels and how to read food label   Heart Parts/Heart  Disease and PAD -Discuss the anatomy of the heart, the pathway of blood circulation through the heart, and these are affected by heart disease.   Stress I: Signs and Symptoms -Discuss the causes of stress, how stress may lead to anxiety and depression, and ways to limit stress.   Stress II: Relaxation -Discuss different types of relaxation techniques to limit stress.   Warning Signs of Stroke / TIA -Discuss definition of a stroke, what the signs and symptoms are of a stroke, and how to identify when someone is having stroke.   Knowledge Questionnaire Score:  Knowledge Questionnaire Score - 09/04/23 1345       Knowledge Questionnaire Score   Pre Score 25/26          Core Components/Risk Factors/Patient Goals at Admission:  Personal Goals and Risk Factors at Admission -  09/04/23 1303       Core Components/Risk Factors/Patient Goals on Admission   Improve shortness of breath with ADL's Yes    Intervention Provide education, individualized exercise plan and daily activity instruction to help decrease symptoms of SOB with activities of daily living.    Expected Outcomes Short Term: Improve cardiorespiratory fitness to achieve a reduction of symptoms when performing ADLs;Long Term: Be able to perform more ADLs without symptoms or delay the onset of symptoms    Hypertension Yes    Intervention Provide education on lifestyle modifcations including regular physical activity/exercise, weight management, moderate sodium restriction and increased consumption of fresh fruit, vegetables, and low fat dairy, alcohol moderation, and smoking cessation.;Monitor prescription use compliance.    Expected Outcomes Short Term: Continued assessment and intervention until BP is < 140/32mm HG in hypertensive participants. < 130/101mm HG in hypertensive participants with diabetes, heart failure or chronic kidney disease.;Long Term: Maintenance of blood pressure at goal levels.    Lipids Yes    Intervention  Provide education and support for participant on nutrition & aerobic/resistive exercise along with prescribed medications to achieve LDL 70mg , HDL >40mg .    Expected Outcomes Long Term: Cholesterol controlled with medications as prescribed, with individualized exercise RX and with personalized nutrition plan. Value goals: LDL < 70mg , HDL > 40 mg.;Short Term: Participant states understanding of desired cholesterol values and is compliant with medications prescribed. Participant is following exercise prescription and nutrition guidelines.    Stress Yes    Intervention Offer individual and/or small group education and counseling on adjustment to heart disease, stress management and health-related lifestyle change. Teach and support self-help strategies.;Refer participants experiencing significant psychosocial distress to appropriate mental health specialists for further evaluation and treatment. When possible, include family members and significant others in education/counseling sessions.    Expected Outcomes Short Term: Participant demonstrates changes in health-related behavior, relaxation and other stress management skills, ability to obtain effective social support, and compliance with psychotropic medications if prescribed.;Long Term: Emotional wellbeing is indicated by absence of clinically significant psychosocial distress or social isolation.          Core Components/Risk Factors/Patient Goals Review:    Core Components/Risk Factors/Patient Goals at Discharge (Final Review):    ITP Comments:  ITP Comments     Row Name 09/04/23 1320 09/09/23 0938 09/11/23 0829 10/09/23 0821     ITP Comments Patient attend orientation today.  Patient is attending Cardiac Rehabilitation Program.  Documentation for diagnosis can be found in [facility].  Reviewed medical chart, RPE/RPD, gym safety, and program guidelines.  Patient was fitted to equipment they will be using during rehab.  Patient is scheduled to start  exercise on 09/09/23.   Initial ITP created and sent for review and signature by Dr. Armida Lander, Medical Director for Cardiac Rehabilitation Program. First full day of exercise!  Patient was oriented to gym and equipment including functions, settings, policies, and procedures.  Patient's individual exercise prescription and treatment plan were reviewed.  All starting workloads were established based on the results of the 6 minute walk test done at initial orientation visit.  The plan for exercise progression was also introduced and progression will be customized based on patient's performance and goals. 30 day review completed. ITP sent to Dr. Armida Lander, Medical Director of Cardiac Rehab. Continue with ITP unless changes are made by physician.    New to program 30 day review completed. ITP sent to Dr. Armida Lander, Medical Director of Cardiac Rehab. Continue with ITP unless  changes are made by physician.       Comments: 30 Day Review

## 2023-10-09 NOTE — Progress Notes (Signed)
 Daily Session Note  Patient Details  Name: Courtney White MRN: [id number] Date of Birth: 07-10-1955 Referring Provider:   Flowsheet Row CARDIAC REHAB PHASE II EXERCISE from 09/04/2023 in [facility] CARDIAC REHABILITATION  Referring Provider Fransico Ivy MD    Encounter Date: 10/09/2023  Check In:  Session Check In - 10/09/23 0915       Check-In   Supervising physician immediately available to respond to emergencies See telemetry face sheet for immediately available MD    Location [facility]    Staff Present Clotilda Danish, BS, Exercise Physiologist;Hillary Troutman BSN, RN;Brooke Rollin Clock, RN    Virtual Visit No    Medication changes reported     No    Fall or balance concerns reported    No    Tobacco Cessation No Change    Warm-up and Cool-down Performed on first and last piece of equipment    Resistance Training Performed Yes    VAD Patient? No    PAD/SET Patient? No      Pain Assessment   Currently in Pain? No/denies    Multiple Pain Sites No          Capillary Blood Glucose: No results found for this or any previous visit (from the past 24 hours).    Social History   Tobacco Use  Smoking Status Never  Smokeless Tobacco Never    Goals Met:  Independence with exercise equipment Exercise tolerated well No report of concerns or symptoms today Strength training completed today  Goals Unmet:  Not Applicable  Comments: Pt able to follow exercise prescription today without complaint.  Will continue to monitor for progression.

## 2023-10-10 ENCOUNTER — Telehealth: Payer: Self-pay | Admitting: Family Medicine

## 2023-10-10 NOTE — Telephone Encounter (Signed)
 Copied from CRM [id number]. Topic: Appointments - Scheduling Inquiry for Clinic >> Oct 10, 2023  2:26 PM Alysia Jumbo S wrote: Reason for CRM: Patient requesting prolia  injection.

## 2023-10-11 NOTE — Telephone Encounter (Signed)
Called patient and made appt

## 2023-10-14 ENCOUNTER — Encounter (HOSPITAL_COMMUNITY)
Admission: RE | Admit: 2023-10-14 | Discharge: 2023-10-14 | Disposition: A | Discharge status: Home / Self Care | Source: Ambulatory Visit | Attending: Cardiology

## 2023-10-14 ENCOUNTER — Ambulatory Visit (INDEPENDENT_AMBULATORY_CARE_PROVIDER_SITE_OTHER)

## 2023-10-14 DIAGNOSIS — I25118 Atherosclerotic heart disease of native coronary artery with other forms of angina pectoris: Secondary | ICD-10-CM | POA: Diagnosis not present

## 2023-10-14 DIAGNOSIS — M810 Age-related osteoporosis without current pathological fracture: Secondary | ICD-10-CM

## 2023-10-14 NOTE — Progress Notes (Signed)
 Patient is in office today for a nurse visit for  Prolia injection.   . Patient Injection was given in the  Right arm. Patient tolerated injection well.

## 2023-10-14 NOTE — Progress Notes (Signed)
 Daily Session Note  Patient Details  Name: Courtney White MRN: [id number] Date of Birth: 1955/10/05 Referring Provider:   Flowsheet Row CARDIAC REHAB PHASE II EXERCISE from 09/04/2023 in [facility] CARDIAC REHABILITATION  Referring Provider Elmira Penman MD    Encounter Date: 10/14/2023  Check In:  Session Check In - 10/14/23 0915       Check-In   Supervising physician immediately available to respond to emergencies See telemetry face sheet for immediately available MD    Location [facility]    Staff Present Powell Benders, BS, Exercise Physiologist;Maliik Karner Foley, BSN, RN;Laureen Delores, BS, RRT, CPFT    Virtual Visit No    Medication changes reported     No    Fall or balance concerns reported    No    Tobacco Cessation No Change    Warm-up and Cool-down Performed on first and last piece of equipment    Resistance Training Performed Yes    VAD Patient? No      Pain Assessment   Currently in Pain? No/denies          Capillary Blood Glucose: No results found for this or any previous visit (from the past 24 hours).    Social History   Tobacco Use  Smoking Status Never  Smokeless Tobacco Never    Goals Met:  Independence with exercise equipment Exercise tolerated well No report of concerns or symptoms today Strength training completed today  Goals Unmet:  Not Applicable  Comments: Pt able to follow exercise prescription today without complaint.  Will continue to monitor for progression.

## 2023-10-16 ENCOUNTER — Encounter (HOSPITAL_COMMUNITY)

## 2023-10-20 ENCOUNTER — Other Ambulatory Visit: Payer: Self-pay | Admitting: Family Medicine

## 2023-10-20 DIAGNOSIS — E890 Postprocedural hypothyroidism: Secondary | ICD-10-CM

## 2023-10-21 ENCOUNTER — Encounter: Payer: Self-pay | Admitting: Pharmacist

## 2023-10-21 ENCOUNTER — Encounter (HOSPITAL_COMMUNITY)
Admission: RE | Admit: 2023-10-21 | Discharge: 2023-10-21 | Disposition: A | Discharge status: Home / Self Care | Source: Ambulatory Visit | Attending: Cardiology | Admitting: Cardiology

## 2023-10-21 ENCOUNTER — Encounter (HOSPITAL_COMMUNITY)

## 2023-10-21 DIAGNOSIS — I25118 Atherosclerotic heart disease of native coronary artery with other forms of angina pectoris: Secondary | ICD-10-CM

## 2023-10-21 DIAGNOSIS — R0609 Other forms of dyspnea: Secondary | ICD-10-CM

## 2023-10-21 NOTE — Progress Notes (Signed)
 Daily Session Note  Patient Details  Name: Courtney White MRN: [id number] Date of Birth: August 12, 1955 Referring Provider:   Flowsheet Row CARDIAC REHAB PHASE II EXERCISE from 09/04/2023 in [facility] CARDIAC REHABILITATION  Referring Provider Elmira Penman MD    Encounter Date: 10/21/2023  Check In:  Session Check In - 10/21/23 0947       Check-In   Supervising physician immediately available to respond to emergencies See telemetry face sheet for immediately available MD    Location [facility]    Staff Present Powell Benders, BS, Exercise Physiologist;Laureen Delores, BS, RRT, CPFT;Nadra Hritz [place], MA, RCEP, CCRP, Sueellen Louder, RN, BSN    Virtual Visit No    Medication changes reported     No    Fall or balance concerns reported    No    Warm-up and Cool-down Performed on first and last piece of equipment    Resistance Training Performed Yes    VAD Patient? No    PAD/SET Patient? No      Pain Assessment   Currently in Pain? No/denies          Capillary Blood Glucose: No results found for this or any previous visit (from the past 24 hours).    Social History   Tobacco Use  Smoking Status Never  Smokeless Tobacco Never    Goals Met:  Exercise tolerated well No report of concerns or symptoms today Strength training completed today  Goals Unmet:  Not Applicable  Comments: Pt able to follow exercise prescription today without complaint.  Will continue to monitor for progression.

## 2023-10-23 ENCOUNTER — Encounter (HOSPITAL_COMMUNITY)

## 2023-10-28 ENCOUNTER — Encounter (HOSPITAL_COMMUNITY)

## 2023-10-30 ENCOUNTER — Telehealth (HOSPITAL_COMMUNITY): Payer: Self-pay

## 2023-10-30 ENCOUNTER — Encounter (HOSPITAL_COMMUNITY)
Admission: RE | Admit: 2023-10-30 | Discharge: 2023-10-30 | Disposition: A | Discharge status: Home / Self Care | Source: Ambulatory Visit | Attending: Cardiology | Admitting: Cardiology

## 2023-10-30 DIAGNOSIS — I25118 Atherosclerotic heart disease of native coronary artery with other forms of angina pectoris: Secondary | ICD-10-CM | POA: Insufficient documentation

## 2023-10-30 DIAGNOSIS — R0609 Other forms of dyspnea: Secondary | ICD-10-CM | POA: Insufficient documentation

## 2023-10-30 NOTE — Telephone Encounter (Signed)
 Called patient regarding attendance to cardiac rehab. She said she had to go out of town to help her mother and plans to be back in [place] next week. She plans to come Monday 7/14.

## 2023-11-04 ENCOUNTER — Encounter (HOSPITAL_COMMUNITY)

## 2023-11-06 ENCOUNTER — Encounter (HOSPITAL_COMMUNITY): Payer: Self-pay | Admitting: *Deleted

## 2023-11-06 ENCOUNTER — Encounter (HOSPITAL_COMMUNITY)

## 2023-11-06 DIAGNOSIS — I25118 Atherosclerotic heart disease of native coronary artery with other forms of angina pectoris: Secondary | ICD-10-CM

## 2023-11-06 NOTE — Progress Notes (Signed)
 Cardiac Individual Treatment Plan  Patient Details  Name: Courtney White MRN: [id number] Date of Birth: Feb 24, 1956 Referring Provider:   Flowsheet Row CARDIAC REHAB PHASE II EXERCISE from 09/04/2023 in [facility] CARDIAC REHABILITATION  Referring Provider Elmira Penman MD    Initial Encounter Date:  Flowsheet Row CARDIAC REHAB PHASE II EXERCISE from 09/04/2023 in [place] [facility] CARDIAC REH[facility]ILITATION  Date 09/04/23    Visit Diagnosis: Atherosclerosis of native coronary artery of native heart with stable angina pectoris (HCC)  Patient's Home Medications on Admission:  Current Outpatient Medications:    acetaminophen  (TYLENOL ) 325 MG tablet, Take 650 mg by mouth every 6 (six) hours as needed for moderate pain., Disp: , Rfl:    amLODipine  (NORVASC ) 5 MG tablet, Take 1 tablet (5 mg total) by mouth daily., Disp: 100 tablet, Rfl: 3   aspirin  EC (ASPIRIN  LOW DOSE) 81 MG tablet, TAKE 1 TABLET (81 MG TOTAL) BY MOUTH DAILY. SWALLOW WHOLE., Disp: 90 tablet, Rfl: 1   Carboxymethylcellul-Glycerin (LUBRICATING EYE DROPS OP), Place 1 drop into both eyes daily as needed (dry eyes)., Disp: , Rfl:    diclofenac Sodium (VOLTAREN) 1 % GEL, Apply 1 Application topically 3 (three) times daily as needed (joint pain)., Disp: , Rfl:    hydrochlorothiazide  (HYDRODIURIL ) 25 MG tablet, Take 0.5 tablets (12.5 mg total) by mouth daily., Disp: 45 tablet, Rfl: 3   ibuprofen (ADVIL) 200 MG tablet, Take 400 mg by mouth every 8 (eight) hours as needed (pain)., Disp: , Rfl:    levothyroxine  (SYNTHROID ) 112 MCG tablet, TAKE 1 TABLET BY MOUTH EVERY DAY, Disp: 100 tablet, Rfl: 0   metoprolol  succinate (TOPROL -XL) 25 MG 24 hr tablet, Take 1 tablet (25 mg total) by mouth daily. TAKE WITH OR IMMEDIATELY FOLLOWING A MEAL., Disp: 100 tablet, Rfl: 3   nitroGLYCERIN  (NITROSTAT ) 0.4 MG SL tablet, Place 1 tablet (0.4 mg total) under the tongue every 5 (five) minutes as needed for chest pain., Disp: 25 tablet, Rfl: 3   rosuvastatin   (CRESTOR ) 10 MG tablet, Take 1 tablet (10 mg total) by mouth daily., Disp: 100 tablet, Rfl: 3   tirzepatide  (ZEPBOUND ) 2.5 MG/0.5ML injection vial, Inject 2.5 mg into the skin once a week., Disp: 2 mL, Rfl: 0   Vitamin D , Ergocalciferol , (DRISDOL ) 1.25 MG (50000 UNIT) CAPS capsule, TAKE 1 CAPSULE (50,000 UNITS TOTAL) BY MOUTH EVERY MONDAY., Disp: 12 capsule, Rfl: 3  Past Medical History: Past Medical History:  Diagnosis Date   COVID-19 12/02/2020   Hepatitis C 1992   s/p treatment.  she has cleared virus   HTN (hypertension)    Thyroid  disease    Vitiligo     Tobacco Use: Social History   Tobacco Use  Smoking Status Never  Smokeless Tobacco Never    Labs: Review Flowsheet  More data exists      Latest Ref Rng & Units 07/07/2021 09/26/2022 10/05/2022 12/27/2022 07/26/2023  Labs for ITP Cardiac and Pulmonary Rehab  Cholestrol 100 - 199 mg/dL 856  853  - 859  -  LDL (calc) 0 - 99 mg/dL 71  75  - 69  -  HDL-C >39 mg/dL 56  53  - 55  -  Trlycerides 0 - 149 mg/dL 82  94  - 81  -  Hemoglobin A1c 4.8 - 5.6 % - - 5.6  6.0  6.2     Capillary Blood Glucose: No results found for: GLUCAP   Exercise Target Goals: Exercise Program Goal: Individual exercise prescription set using results from initial  6 min walk test and THRR while considering  patient's activity barriers and safety.   Exercise Prescription Goal: Starting with aerobic activity 30 plus minutes a day, 3 days per week for initial exercise prescription. Provide home exercise prescription and guidelines that participant acknowledges understanding prior to discharge.  Activity Barriers & Risk Stratification:  Activity Barriers & Cardiac Risk Stratification - 09/04/23 1303       Activity Barriers & Cardiac Risk Stratification   Activity Barriers Shortness of Breath;Balance Concerns;Chest Pain/Angina    Cardiac Risk Stratification Moderate          6 Minute Walk:  6 Minute Walk     Row Name 09/04/23 1339         6  Minute Walk   Phase Initial     Distance 1435 feet     Walk Time 6 minutes     # of Rest Breaks 0     MPH 2.72     METS 3.17     RPE 12     Perceived Dyspnea  0     VO2 Peak 11.11     Symptoms No     Resting HR 80 bpm     Resting BP 130/80     Resting Oxygen Saturation  97 %     Exercise Oxygen Saturation  during 6 min walk 98 %     Max Ex. HR 109 bpm     Max Ex. BP 136/82     2 Minute Post BP 128/80        Oxygen Initial Assessment:   Oxygen Re-Evaluation:   Oxygen Discharge (Final Oxygen Re-Evaluation):   Initial Exercise Prescription:  Initial Exercise Prescription - 09/04/23 1300       Date of Initial Exercise RX and Referring Provider   Date 09/04/23    Referring Provider Elmira Penman MD      Treadmill   MPH 2    Grade 0.5    Minutes 15    METs 2.67      NuStep   Level 2    SPM 50    Minutes 15    METs 2      Prescription Details   Frequency (times per week) 2    Duration Progress to 30 minutes of continuous aerobic without signs/symptoms of physical distress      Intensity   THRR 40-80% of Max Heartrate 109-138    Ratings of Perceived Exertion 11-13    Perceived Dyspnea 0-4      Resistance Training   Training Prescription Yes    Weight 4    Reps 10-15          Perform Capillary Blood Glucose checks as needed.  Exercise Prescription Changes:   Exercise Prescription Changes     Row Name 09/04/23 1300 09/18/23 1300 10/14/23 1500         Response to Exercise   Blood Pressure (Admit) 130/80 126/60 110/70     Blood Pressure (Exercise) 136/82 132/60 150/70     Blood Pressure (Exit) 128/80 108/60 106/68     Heart Rate (Admit) 80 bpm 81 bpm 67 bpm     Heart Rate (Exercise) 109 bpm 114 bpm 137 bpm     Heart Rate (Exit) 85 bpm 101 bpm 101 bpm     Oxygen Saturation (Admit) 97 % -- --     Oxygen Saturation (Exercise) 98 % -- --     Oxygen Saturation (Exit) 98 % -- --  Rating of Perceived Exertion (Exercise) 12 12 12       Perceived Dyspnea (Exercise) 0 0 0     Duration -- Continue with 30 min of aerobic exercise without signs/symptoms of physical distress. Continue with 30 min of aerobic exercise without signs/symptoms of physical distress.     Intensity -- THRR unchanged THRR unchanged       Progression   Progression -- Continue to progress workloads to maintain intensity without signs/symptoms of physical distress. Continue to progress workloads to maintain intensity without signs/symptoms of physical distress.       Resistance Training   Training Prescription -- Yes Yes     Weight -- 4 4     Reps -- 10-15 10-15       Treadmill   MPH -- 3 3.2     Grade -- 0.5 0.5     Minutes -- 15 15     METs -- 3.5 3.67       NuStep   Level -- 2 5     SPM -- 102 100     Minutes -- 15 15     METs -- 3.1 3        Exercise Comments:   Exercise Comments     Row Name 09/04/23 1324 09/09/23 [id number]         Exercise Comments Patient states she used to do the silver sneakers program up until about a month ago. Does not do much exercise now. First full day of exercise!  Patient was oriented to gym and equipment including functions, settings, policies, and procedures.  Patient's individual exercise prescription and treatment plan were reviewed.  All starting workloads were established based on the results of the 6 minute walk test done at initial orientation visit.  The plan for exercise progression was also introduced and progression will be customized based on patient's performance and goals.         Exercise Goals and Review:   Exercise Goals     Row Name 09/04/23 1324             Exercise Goals   Increase Physical Activity Yes       Intervention Provide advice, education, support and counseling about physical activity/exercise needs.;Develop an individualized exercise prescription for aerobic and resistive training based on initial evaluation findings, risk stratification, comorbidities and participant's  personal goals.       Expected Outcomes Short Term: Attend rehab on a regular basis to increase amount of physical activity.;Long Term: Add in home exercise to make exercise part of routine and to increase amount of physical activity.;Long Term: Exercising regularly at least 3-5 days a week.       Increase Strength and Stamina Yes       Intervention Provide advice, education, support and counseling about physical activity/exercise needs.;Develop an individualized exercise prescription for aerobic and resistive training based on initial evaluation findings, risk stratification, comorbidities and participant's personal goals.       Expected Outcomes Short Term: Increase workloads from initial exercise prescription for resistance, speed, and METs.;Short Term: Perform resistance training exercises routinely during rehab and add in resistance training at home;Long Term: Improve cardiorespiratory fitness, muscular endurance and strength as measured by increased METs and functional capacity ( )       Able to understand and use rate of perceived exertion (RPE) scale Yes       Intervention Provide education and explanation on how to use RPE scale       Expected  Outcomes Short Term: Able to use RPE daily in rehab to express subjective intensity level;Long Term:  Able to use RPE to guide intensity level when exercising independently       Able to understand and use Dyspnea scale Yes       Intervention Provide education and explanation on how to use Dyspnea scale       Expected Outcomes Short Term: Able to use Dyspnea scale daily in rehab to express subjective sense of shortness of breath during exertion;Long Term: Able to use Dyspnea scale to guide intensity level when exercising independently       Knowledge and understanding of Target Heart Rate Range (THRR) Yes       Intervention Provide education and explanation of THRR including how the numbers were predicted and where they are located for reference        Expected Outcomes Short Term: Able to state/look up THRR;Long Term: Able to use THRR to govern intensity when exercising independently;Short Term: Able to use daily as guideline for intensity in rehab       Able to check pulse independently Yes       Intervention Provide education and demonstration on how to check pulse in carotid and radial arteries.;Review the importance of being able to check your own pulse for safety during independent exercise       Expected Outcomes Short Term: Able to explain why pulse checking is important during independent exercise;Long Term: Able to check pulse independently and accurately       Understanding of Exercise Prescription Yes       Intervention Provide education, explanation, and written materials on patient's individual exercise prescription       Expected Outcomes Short Term: Able to explain program exercise prescription;Long Term: Able to explain home exercise prescription to exercise independently          Exercise Goals Re-Evaluation :  Exercise Goals Re-Evaluation     Row Name 09/09/23 [id number]             Exercise Goal Re-Evaluation   Exercise Goals Review Understanding of Exercise Prescription;Able to understand and use rate of perceived exertion (RPE) scale;Able to understand and use Dyspnea scale;Knowledge and understanding of Target Heart Rate Range (THRR)       Comments Reviewed RPE and dyspnea scale, THR and program prescription with pt today.  Pt voiced understanding and was given a copy of goals to take home.       Expected Outcomes Short: Use RPE daily to regulate intensity.  Long: Follow program prescription in THR.           Discharge Exercise Prescription (Final Exercise Prescription Changes):  Exercise Prescription Changes - 10/14/23 1500       Response to Exercise   Blood Pressure (Admit) 110/70    Blood Pressure (Exercise) 150/70    Blood Pressure (Exit) 106/68    Heart Rate (Admit) 67 bpm    Heart Rate (Exercise) 137 bpm     Heart Rate (Exit) 101 bpm    Rating of Perceived Exertion (Exercise) 12    Perceived Dyspnea (Exercise) 0    Duration Continue with 30 min of aerobic exercise without signs/symptoms of physical distress.    Intensity THRR unchanged      Progression   Progression Continue to progress workloads to maintain intensity without signs/symptoms of physical distress.      Resistance Training   Training Prescription Yes    Weight 4    Reps 10-15  Treadmill   MPH 3.2    Grade 0.5    Minutes 15    METs 3.67      NuStep   Level 5    SPM 100    Minutes 15    METs 3          Nutrition:  Target Goals: Understanding of nutrition guidelines, daily intake of sodium 1500mg , cholesterol 200mg , calories 30% from fat and 7% or less from saturated fats, daily to have 5 or more servings of fruits and vegetables.  Biometrics:  Pre Biometrics - 09/04/23 1342       Pre Biometrics   Height 5' 7 (1.702 m)    Weight 175 lb 14.8 oz (79.8 kg)    Waist Circumference 37 inches    Hip Circumference 41 inches    Waist to Hip Ratio 0.9 %    BMI (Calculated) 27.55    Grip Strength 15.1 kg    Single Leg Stand 10.4 seconds           Nutrition Therapy Plan and Nutrition Goals:  Nutrition Therapy & Goals - 09/04/23 1302       Intervention Plan   Intervention Prescribe, educate and counsel regarding individualized specific dietary modifications aiming towards targeted core components such as weight, hypertension, lipid management, diabetes, heart failure and other comorbidities.;Nutrition handout(s) given to patient.    Expected Outcomes Short Term Goal: Understand basic principles of dietary content, such as calories, fat, sodium, cholesterol and nutrients.;Short Term Goal: A plan has been developed with personal nutrition goals set during dietitian appointment.;Long Term Goal: Adherence to prescribed nutrition plan.          Nutrition Assessments:  MEDIFICTS Score Key: >=70 Need to  make dietary changes  40-70 Heart Healthy Diet <= 40 Therapeutic Level Cholesterol Diet  Flowsheet Row CARDIAC REHAB PHASE II EXERCISE from 09/04/2023 in [facility] CARDIAC REHABILITATION  Picture Your Plate Total Score on Admission 61   Picture Your Plate Scores: <59 Unhealthy dietary pattern with much room for improvement. 41-50 Dietary pattern unlikely to meet recommendations for good health and room for improvement. 51-60 More healthful dietary pattern, with some room for improvement.  >60 Healthy dietary pattern, although there may be some specific behaviors that could be improved.    Nutrition Goals Re-Evaluation:   Nutrition Goals Discharge (Final Nutrition Goals Re-Evaluation):   Psychosocial: Target Goals: Acknowledge presence or absence of significant depression and/or stress, maximize coping skills, provide positive support system. Participant is able to verbalize types and ability to use techniques and skills needed for reducing stress and depression.  Initial Review & Psychosocial Screening:  Initial Psych Review & Screening - 09/04/23 1301       Initial Review   Current issues with Current Stress Concerns    Source of Stress Concerns Family;Chronic Illness;Unable to perform yard/household activities    Comments Courtney White has her mother moved in with her who is suffering from dementia so she is experiencing caregiver stress. She also states it takes her longer to do housework than it used to.      Family Dynamics   Good Support System? Yes    Comments Courtney White states her significant other is her support system. They have been engaged for several years now but have yet to get married.      Barriers   Psychosocial barriers to participate in program The patient should benefit from training in stress management and relaxation.      Screening Interventions   Interventions  To provide support and resources with identified psychosocial needs;Encouraged to exercise    Expected  Outcomes Short Term goal: Utilizing psychosocial counselor, staff and physician to assist with identification of specific Stressors or current issues interfering with healing process. Setting desired goal for each stressor or current issue identified.;Long Term Goal: Stressors or current issues are controlled or eliminated.;Short Term goal: Identification and review with participant of any Quality of Life or Depression concerns found by scoring the questionnaire.;Long Term goal: The participant improves quality of Life and PHQ9 Scores as seen by post scores and/or verbalization of changes          Quality of Life Scores:  Quality of Life - 09/04/23 1344       Quality of Life   Select Quality of Life      Quality of Life Scores   Health/Function Pre 17.2 %    Socioeconomic Pre 25.94 %    Psych/Spiritual Pre 28.29 %    Family Pre 6.6 %    GLOBAL Pre 19.9 %         Scores of 19 and below usually indicate a poorer quality of life in these areas.  A difference of  2-3 points is a clinically meaningful difference.  A difference of 2-3 points in the total score of the Quality of Life Index has been associated with significant improvement in overall quality of life, self-image, physical symptoms, and general health in studies assessing change in quality of life.  PHQ-9: Review Flowsheet  More data exists      09/04/2023 07/26/2023 03/27/2023 10/05/2022 03/22/2022  Depression screen PHQ 2/9  Decreased Interest 0 0 0 0 0  Down, Depressed, Hopeless 0 0 0 0 0  PHQ - 2 Score 0 0 0 0 0  Altered sleeping 1 0 - 0 -  Tired, decreased energy 1 0 - 0 -  Change in appetite 0 0 - 0 -  Feeling bad or failure about yourself  0 0 - 0 -  Trouble concentrating 0 0 - 0 -  Moving slowly or fidgety/restless 0 0 - 0 -  Suicidal thoughts 0 0 - 0 -  PHQ-9 Score 2 0 - 0 -  Difficult doing work/chores Not difficult at all Not difficult at all - Not difficult at all -   Interpretation of Total Score  Total  Score Depression Severity:  1-4 = Minimal depression, 5-9 = Mild depression, 10-14 = Moderate depression, 15-19 = Moderately severe depression, 20-27 = Severe depression   Psychosocial Evaluation and Intervention:  Psychosocial Evaluation - 09/04/23 1302       Psychosocial Evaluation & Interventions   Interventions Stress management education;Encouraged to exercise with the program and follow exercise prescription;Relaxation education    Comments Courtney White is a pleasant lady who is eager to start the program because she wants to start feeling better and get her endurance and strength back. Her mobility is good although she says she gets SOB easily and it makes her have coughing spells which then causes her to get dizzy. She states that it takes her longer to do house and yard work now. Where it used to take her a day, now it may take her several days to do it. She has been experiencing some stress from that and some stress from her mother living with her who suffers from dementia, so she is undergoing some caregiver stress. She states that her significant other is a great support system for her. She doesn't currently do any  exercise as about a month ago she was doing silver sneakers but not anymore. She is retired and in her spare time she likes to garden and make floral arrangements.    Expected Outcomes Short: To feel better! Long: Increase strength and stamina.    Continue Psychosocial Services  Follow up required by staff          Psychosocial Re-Evaluation:   Psychosocial Discharge (Final Psychosocial Re-Evaluation):   Vocational Rehabilitation: Provide vocational rehab assistance to qualifying candidates.   Vocational Rehab Evaluation & Intervention:  Vocational Rehab - 09/04/23 1303       Initial Vocational Rehab Evaluation & Intervention   Assessment shows need for Vocational Rehabilitation No          Education: Education Goals: Education classes will be provided on a  weekly basis, covering required topics. Participant will state understanding/return demonstration of topics presented.  Learning Barriers/Preferences:  Learning Barriers/Preferences - 09/04/23 1302       Learning Barriers/Preferences   Learning Barriers None    Learning Preferences Audio;Computer/Internet;Group Instruction;Skilled Demonstration;Verbal Instruction;Video          Education Topics: Hypertension, Hypertension Reduction -Define heart disease and high blood pressure. Discus how high blood pressure affects the body and ways to reduce high blood pressure.   Exercise and Your Heart -Discuss why it is important to exercise, the FITT principles of exercise, normal and abnormal responses to exercise, and how to exercise safely. Flowsheet Row CARDIAC REHAB PHASE II EXERCISE from 10/09/2023 in [place] [facility] CARDIAC REH[facility]ILITATION  Date 10/09/23  Educator hb  Instruction Review Code 1- Verbalizes Understanding    Angina -Discuss definition of angina, causes of angina, treatment of angina, and how to decrease risk of having angina.   Cardiac Medications -Review what the following cardiac medications are used for, how they affect the body, and side effects that may occur when taking the medications.  Medications include Aspirin , Beta blockers, calcium  channel blockers, ACE Inhibitors, angiotensin receptor blockers, diuretics, digoxin, and antihyperlipidemics. Flowsheet Row CARDIAC REHAB PHASE II EXERCISE from 10/09/2023 in [place] [facility] CARDIAC REH[facility]ILITATION  Date 09/18/23  Educator jh  Instruction Review Code 1- Verbalizes Understanding    Congestive Heart Failure -Discuss the definition of CHF, how to live with CHF, the signs and symptoms of CHF, and how keep track of weight and sodium intake.   Heart Disease and Intimacy -Discus the effect sexual activity has on the heart, how changes occur during intimacy as we age, and safety during sexual activity.   Smoking Cessation /  COPD -Discuss different methods to quit smoking, the health benefits of quitting smoking, and the definition of COPD.   Nutrition I: Fats -Discuss the types of cholesterol, what cholesterol does to the heart, and how cholesterol levels can be controlled.   Nutrition II: Labels -Discuss the different components of food labels and how to read food label   Heart Parts/Heart Disease and PAD -Discuss the anatomy of the heart, the pathway of blood circulation through the heart, and these are affected by heart disease.   Stress I: Signs and Symptoms -Discuss the causes of stress, how stress may lead to anxiety and depression, and ways to limit stress.   Stress II: Relaxation -Discuss different types of relaxation techniques to limit stress.   Warning Signs of Stroke / TIA -Discuss definition of a stroke, what the signs and symptoms are of a stroke, and how to identify when someone is having stroke.   Knowledge Questionnaire Score:  Knowledge Questionnaire  Score - 09/04/23 1345       Knowledge Questionnaire Score   Pre Score 25/26          Core Components/Risk Factors/Patient Goals at Admission:  Personal Goals and Risk Factors at Admission - 09/04/23 1303       Core Components/Risk Factors/Patient Goals on Admission   Improve shortness of breath with ADL's Yes    Intervention Provide education, individualized exercise plan and daily activity instruction to help decrease symptoms of SOB with activities of daily living.    Expected Outcomes Short Term: Improve cardiorespiratory fitness to achieve a reduction of symptoms when performing ADLs;Long Term: Be able to perform more ADLs without symptoms or delay the onset of symptoms    Hypertension Yes    Intervention Provide education on lifestyle modifcations including regular physical activity/exercise, weight management, moderate sodium restriction and increased consumption of fresh fruit, vegetables, and low fat dairy, alcohol  moderation, and smoking cessation.;Monitor prescription use compliance.    Expected Outcomes Short Term: Continued assessment and intervention until BP is < 140/41mm HG in hypertensive participants. < 130/59mm HG in hypertensive participants with diabetes, heart failure or chronic kidney disease.;Long Term: Maintenance of blood pressure at goal levels.    Lipids Yes    Intervention Provide education and support for participant on nutrition & aerobic/resistive exercise along with prescribed medications to achieve LDL 70mg , HDL >40mg .    Expected Outcomes Long Term: Cholesterol controlled with medications as prescribed, with individualized exercise RX and with personalized nutrition plan. Value goals: LDL < 70mg , HDL > 40 mg.;Short Term: Participant states understanding of desired cholesterol values and is compliant with medications prescribed. Participant is following exercise prescription and nutrition guidelines.    Stress Yes    Intervention Offer individual and/or small group education and counseling on adjustment to heart disease, stress management and health-related lifestyle change. Teach and support self-help strategies.;Refer participants experiencing significant psychosocial distress to appropriate mental health specialists for further evaluation and treatment. When possible, include family members and significant others in education/counseling sessions.    Expected Outcomes Short Term: Participant demonstrates changes in health-related behavior, relaxation and other stress management skills, ability to obtain effective social support, and compliance with psychotropic medications if prescribed.;Long Term: Emotional wellbeing is indicated by absence of clinically significant psychosocial distress or social isolation.          Core Components/Risk Factors/Patient Goals Review:    Core Components/Risk Factors/Patient Goals at Discharge (Final Review):    ITP Comments:  ITP Comments      Row Name 09/04/23 1320 09/09/23 0938 09/11/23 0829 10/09/23 0821 10/30/23 1015   ITP Comments Patient attend orientation today.  Patient is attending [facility] Program.  Documentation for diagnosis can be found in [facility].  Reviewed medical chart, RPE/RPD, gym safety, and program guidelines.  Patient was fitted to equipment they will be using during rehab.  Patient is scheduled to start exercise on 09/09/23.   Initial ITP created and sent for review and signature by Dr. Dorn Ross, Medical Director for Cardiac Rehabilitation Program. First full day of exercise!  Patient was oriented to gym and equipment including functions, settings, policies, and procedures.  Patient's individual exercise prescription and treatment plan were reviewed.  All starting workloads were established based on the results of the 6 minute walk test done at initial orientation visit.  The plan for exercise progression was also introduced and progression will be customized based on patient's performance and goals. 30 day review completed. ITP sent to  Dr. Dorn Ross, Medical Director of Cardiac Rehab. Continue with ITP unless changes are made by physician.    New to program 30 day review completed. ITP sent to Dr. Dorn Ross, Medical Director of Cardiac Rehab. Continue with ITP unless changes are made by physician. Called patient regarding attendance to [facility]. She said she had to go out of town to help her mother and plans to be back in [employer] next week. She plans to come Monday 7/14.    Row Name 11/06/23 0831           ITP Comments 30 day review completed. ITP sent to Dr. Dorn Ross, Medical Director of Cardiac Rehab. Continue with ITP unless changes are made by physician.  Pt has been out of town with her mom but has not returned yet.  Last attended on 10/21/23.  Unable to assess goals this round.          Comments: 30 day review

## 2023-11-11 ENCOUNTER — Encounter (HOSPITAL_COMMUNITY)

## 2023-11-11 ENCOUNTER — Telehealth (HOSPITAL_COMMUNITY): Payer: Self-pay

## 2023-11-13 ENCOUNTER — Encounter (HOSPITAL_COMMUNITY)

## 2023-11-13 ENCOUNTER — Telehealth (HOSPITAL_COMMUNITY): Payer: Self-pay

## 2023-11-18 ENCOUNTER — Encounter (HOSPITAL_COMMUNITY)

## 2023-11-19 ENCOUNTER — Telehealth (HOSPITAL_COMMUNITY): Payer: Self-pay

## 2023-11-20 ENCOUNTER — Encounter (HOSPITAL_COMMUNITY)

## 2023-11-24 ENCOUNTER — Other Ambulatory Visit: Payer: Self-pay | Admitting: Family Medicine

## 2023-11-24 DIAGNOSIS — I25118 Atherosclerotic heart disease of native coronary artery with other forms of angina pectoris: Secondary | ICD-10-CM

## 2023-11-25 ENCOUNTER — Encounter (HOSPITAL_COMMUNITY)

## 2023-11-27 ENCOUNTER — Encounter (HOSPITAL_COMMUNITY)

## 2023-12-02 ENCOUNTER — Encounter (HOSPITAL_COMMUNITY)

## 2023-12-04 ENCOUNTER — Encounter (HOSPITAL_COMMUNITY)

## 2023-12-04 ENCOUNTER — Encounter (HOSPITAL_COMMUNITY): Payer: Self-pay | Admitting: *Deleted

## 2023-12-04 DIAGNOSIS — I25118 Atherosclerotic heart disease of native coronary artery with other forms of angina pectoris: Secondary | ICD-10-CM

## 2023-12-04 NOTE — Progress Notes (Signed)
 Cardiac Individual Treatment Plan  Patient Details  Name: Courtney White MRN: [id number] Date of Birth: 1956-03-05 Referring Provider:   Flowsheet Row CARDIAC REHAB PHASE II EXERCISE from 09/04/2023 in [facility] CARDIAC REHABILITATION  Referring Provider Elmira Penman MD    Initial Encounter Date:  Flowsheet Row CARDIAC REHAB PHASE II EXERCISE from 09/04/2023 in [place] [facility] CARDIAC REHABILITATION  Date 09/04/23    Visit Diagnosis: Atherosclerosis of native coronary artery of native heart with stable angina pectoris (HCC)  Patient's Home Medications on Admission:  Current Outpatient Medications:    acetaminophen  (TYLENOL ) 325 MG tablet, Take 650 mg by mouth every 6 (six) hours as needed for moderate pain., Disp: , Rfl:    amLODipine  (NORVASC ) 5 MG tablet, Take 1 tablet (5 mg total) by mouth daily., Disp: 100 tablet, Rfl: 3   aspirin  EC (ASPIRIN  LOW DOSE) 81 MG tablet, TAKE 1 TABLET (81 MG TOTAL) BY MOUTH DAILY. SWALLOW WHOLE., Disp: 90 tablet, Rfl: 1   Carboxymethylcellul-Glycerin (LUBRICATING EYE DROPS OP), Place 1 drop into both eyes daily as needed (dry eyes)., Disp: , Rfl:    diclofenac Sodium (VOLTAREN) 1 % GEL, Apply 1 Application topically 3 (three) times daily as needed (joint pain)., Disp: , Rfl:    hydrochlorothiazide  (HYDRODIURIL ) 25 MG tablet, Take 0.5 tablets (12.5 mg total) by mouth daily., Disp: 45 tablet, Rfl: 3   ibuprofen (ADVIL) 200 MG tablet, Take 400 mg by mouth every 8 (eight) hours as needed (pain)., Disp: , Rfl:    levothyroxine  (SYNTHROID ) 112 MCG tablet, TAKE 1 TABLET BY MOUTH EVERY DAY, Disp: 100 tablet, Rfl: 0   metoprolol  succinate (TOPROL -XL) 25 MG 24 hr tablet, Take 1 tablet (25 mg total) by mouth daily. TAKE WITH OR IMMEDIATELY FOLLOWING A MEAL., Disp: 100 tablet, Rfl: 3   nitroGLYCERIN  (NITROSTAT ) 0.4 MG SL tablet, PLACE 1 TABLET UNDER THE TONGUE EVERY 5 MINUTES AS NEEDED FOR CHEST PAIN., Disp: 75 tablet, Rfl: 2   rosuvastatin  (CRESTOR ) 10 MG tablet,  Take 1 tablet (10 mg total) by mouth daily., Disp: 100 tablet, Rfl: 3   tirzepatide  (ZEPBOUND ) 2.5 MG/0.5ML injection vial, Inject 2.5 mg into the skin once a week., Disp: 2 mL, Rfl: 0   Vitamin D , Ergocalciferol , (DRISDOL ) 1.25 MG (50000 UNIT) CAPS capsule, TAKE 1 CAPSULE (50,000 UNITS TOTAL) BY MOUTH EVERY MONDAY., Disp: 12 capsule, Rfl: 3  Past Medical History: Past Medical History:  Diagnosis Date   COVID-19 12/02/2020   Hepatitis C 1992   s/p treatment.  she has cleared virus   HTN (hypertension)    Thyroid  disease    Vitiligo     Tobacco Use: Social History   Tobacco Use  Smoking Status Never  Smokeless Tobacco Never    Labs: Review Flowsheet  More data exists      Latest Ref Rng & Units 07/07/2021 09/26/2022 10/05/2022 12/27/2022 07/26/2023  Labs for ITP Cardiac and Pulmonary Rehab  Cholestrol 100 - 199 mg/dL 856  853  - 859  -  LDL (calc) 0 - 99 mg/dL 71  75  - 69  -  HDL-C >39 mg/dL 56  53  - 55  -  Trlycerides 0 - 149 mg/dL 82  94  - 81  -  Hemoglobin A1c 4.8 - 5.6 % - - 5.6  6.0  6.2     Capillary Blood Glucose: No results found for: GLUCAP   Exercise Target Goals: Exercise Program Goal: Individual exercise prescription set using results from initial 6 min walk test  and THRR while considering  patient's activity barriers and safety.   Exercise Prescription Goal: Starting with aerobic activity 30 plus minutes a day, 3 days per week for initial exercise prescription. Provide home exercise prescription and guidelines that participant acknowledges understanding prior to discharge.  Activity Barriers & Risk Stratification:  Activity Barriers & Cardiac Risk Stratification - 09/04/23 1303       Activity Barriers & Cardiac Risk Stratification   Activity Barriers Shortness of Breath;Balance Concerns;Chest Pain/Angina    Cardiac Risk Stratification Moderate          6 Minute Walk:  6 Minute Walk     Row Name 09/04/23 1339         6 Minute Walk   Phase  Initial     Distance 1435 feet     Walk Time 6 minutes     # of Rest Breaks 0     MPH 2.72     METS 3.17     RPE 12     Perceived Dyspnea  0     VO2 Peak 11.11     Symptoms No     Resting HR 80 bpm     Resting BP 130/80     Resting Oxygen Saturation  97 %     Exercise Oxygen Saturation  during 6 min walk 98 %     Max Ex. HR 109 bpm     Max Ex. BP 136/82     2 Minute Post BP 128/80        Oxygen Initial Assessment:   Oxygen Re-Evaluation:   Oxygen Discharge (Final Oxygen Re-Evaluation):   Initial Exercise Prescription:  Initial Exercise Prescription - 09/04/23 1300       Date of Initial Exercise RX and Referring Provider   Date 09/04/23    Referring Provider Elmira Penman MD      Treadmill   MPH 2    Grade 0.5    Minutes 15    METs 2.67      NuStep   Level 2    SPM 50    Minutes 15    METs 2      Prescription Details   Frequency (times per week) 2    Duration Progress to 30 minutes of continuous aerobic without signs/symptoms of physical distress      Intensity   THRR 40-80% of Max Heartrate 109-138    Ratings of Perceived Exertion 11-13    Perceived Dyspnea 0-4      Resistance Training   Training Prescription Yes    Weight 4    Reps 10-15          Perform Capillary Blood Glucose checks as needed.  Exercise Prescription Changes:   Exercise Prescription Changes     Row Name 09/04/23 1300 09/18/23 1300 10/14/23 1500         Response to Exercise   Blood Pressure (Admit) 130/80 126/60 110/70     Blood Pressure (Exercise) 136/82 132/60 150/70     Blood Pressure (Exit) 128/80 108/60 106/68     Heart Rate (Admit) 80 bpm 81 bpm 67 bpm     Heart Rate (Exercise) 109 bpm 114 bpm 137 bpm     Heart Rate (Exit) 85 bpm 101 bpm 101 bpm     Oxygen Saturation (Admit) 97 % -- --     Oxygen Saturation (Exercise) 98 % -- --     Oxygen Saturation (Exit) 98 % -- --     Rating of  Perceived Exertion (Exercise) 12 12 12      Perceived Dyspnea  (Exercise) 0 0 0     Duration -- Continue with 30 min of aerobic exercise without signs/symptoms of physical distress. Continue with 30 min of aerobic exercise without signs/symptoms of physical distress.     Intensity -- THRR unchanged THRR unchanged       Progression   Progression -- Continue to progress workloads to maintain intensity without signs/symptoms of physical distress. Continue to progress workloads to maintain intensity without signs/symptoms of physical distress.       Resistance Training   Training Prescription -- Yes Yes     Weight -- 4 4     Reps -- 10-15 10-15       Treadmill   MPH -- 3 3.2     Grade -- 0.5 0.5     Minutes -- 15 15     METs -- 3.5 3.67       NuStep   Level -- 2 5     SPM -- 102 100     Minutes -- 15 15     METs -- 3.1 3        Exercise Comments:   Exercise Comments     Row Name 09/04/23 1324 09/09/23 9061         Exercise Comments Patient states she used to do the silver sneakers program up until about a month ago. Does not do much exercise now. First full day of exercise!  Patient was oriented to gym and equipment including functions, settings, policies, and procedures.  Patient's individual exercise prescription and treatment plan were reviewed.  All starting workloads were established based on the results of the 6 minute walk test done at initial orientation visit.  The plan for exercise progression was also introduced and progression will be customized based on patient's performance and goals.         Exercise Goals and Review:   Exercise Goals     Row Name 09/04/23 1324             Exercise Goals   Increase Physical Activity Yes       Intervention Provide advice, education, support and counseling about physical activity/exercise needs.;Develop an individualized exercise prescription for aerobic and resistive training based on initial evaluation findings, risk stratification, comorbidities and participant's personal goals.        Expected Outcomes Short Term: Attend rehab on a regular basis to increase amount of physical activity.;Long Term: Add in home exercise to make exercise part of routine and to increase amount of physical activity.;Long Term: Exercising regularly at least 3-5 days a week.       Increase Strength and Stamina Yes       Intervention Provide advice, education, support and counseling about physical activity/exercise needs.;Develop an individualized exercise prescription for aerobic and resistive training based on initial evaluation findings, risk stratification, comorbidities and participant's personal goals.       Expected Outcomes Short Term: Increase workloads from initial exercise prescription for resistance, speed, and METs.;Short Term: Perform resistance training exercises routinely during rehab and add in resistance training at home;Long Term: Improve cardiorespiratory fitness, muscular endurance and strength as measured by increased METs and functional capacity ( )       Able to understand and use rate of perceived exertion (RPE) scale Yes       Intervention Provide education and explanation on how to use RPE scale       Expected Outcomes Short  Term: Able to use RPE daily in rehab to express subjective intensity level;Long Term:  Able to use RPE to guide intensity level when exercising independently       Able to understand and use Dyspnea scale Yes       Intervention Provide education and explanation on how to use Dyspnea scale       Expected Outcomes Short Term: Able to use Dyspnea scale daily in rehab to express subjective sense of shortness of breath during exertion;Long Term: Able to use Dyspnea scale to guide intensity level when exercising independently       Knowledge and understanding of Target Heart Rate Range (THRR) Yes       Intervention Provide education and explanation of THRR including how the numbers were predicted and where they are located for reference       Expected Outcomes  Short Term: Able to state/look up THRR;Long Term: Able to use THRR to govern intensity when exercising independently;Short Term: Able to use daily as guideline for intensity in rehab       Able to check pulse independently Yes       Intervention Provide education and demonstration on how to check pulse in carotid and radial arteries.;Review the importance of being able to check your own pulse for safety during independent exercise       Expected Outcomes Short Term: Able to explain why pulse checking is important during independent exercise;Long Term: Able to check pulse independently and accurately       Understanding of Exercise Prescription Yes       Intervention Provide education, explanation, and written materials on patient's individual exercise prescription       Expected Outcomes Short Term: Able to explain program exercise prescription;Long Term: Able to explain home exercise prescription to exercise independently          Exercise Goals Re-Evaluation :  Exercise Goals Re-Evaluation     Row Name 09/09/23 [phone]             Exercise Goal Re-Evaluation   Exercise Goals Review Understanding of Exercise Prescription;Able to understand and use rate of perceived exertion (RPE) scale;Able to understand and use Dyspnea scale;Knowledge and understanding of Target Heart Rate Range (THRR)       Comments Reviewed RPE and dyspnea scale, THR and program prescription with pt today.  Pt voiced understanding and was given a copy of goals to take home.       Expected Outcomes Short: Use RPE daily to regulate intensity.  Long: Follow program prescription in THR.           Discharge Exercise Prescription (Final Exercise Prescription Changes):  Exercise Prescription Changes - 10/14/23 1500       Response to Exercise   Blood Pressure (Admit) 110/70    Blood Pressure (Exercise) 150/70    Blood Pressure (Exit) 106/68    Heart Rate (Admit) 67 bpm    Heart Rate (Exercise) 137 bpm    Heart Rate  (Exit) 101 bpm    Rating of Perceived Exertion (Exercise) 12    Perceived Dyspnea (Exercise) 0    Duration Continue with 30 min of aerobic exercise without signs/symptoms of physical distress.    Intensity THRR unchanged      Progression   Progression Continue to progress workloads to maintain intensity without signs/symptoms of physical distress.      Resistance Training   Training Prescription Yes    Weight 4    Reps 10-15  Treadmill   MPH 3.2    Grade 0.5    Minutes 15    METs 3.67      NuStep   Level 5    SPM 100    Minutes 15    METs 3          Nutrition:  Target Goals: Understanding of nutrition guidelines, daily intake of sodium 1500mg , cholesterol 200mg , calories 30% from fat and 7% or less from saturated fats, daily to have 5 or more servings of fruits and vegetables.  Biometrics:  Pre Biometrics - 09/04/23 1342       Pre Biometrics   Height 5' 7 (1.702 m)    Weight 175 lb 14.8 oz (79.8 kg)    Waist Circumference 37 inches    Hip Circumference 41 inches    Waist to Hip Ratio 0.9 %    BMI (Calculated) 27.55    Grip Strength 15.1 kg    Single Leg Stand 10.4 seconds           Nutrition Therapy Plan and Nutrition Goals:  Nutrition Therapy & Goals - 09/04/23 1302       Intervention Plan   Intervention Prescribe, educate and counsel regarding individualized specific dietary modifications aiming towards targeted core components such as weight, hypertension, lipid management, diabetes, heart failure and other comorbidities.;Nutrition handout(s) given to patient.    Expected Outcomes Short Term Goal: Understand basic principles of dietary content, such as calories, fat, sodium, cholesterol and nutrients.;Short Term Goal: A plan has been developed with personal nutrition goals set during dietitian appointment.;Long Term Goal: Adherence to prescribed nutrition plan.          Nutrition Assessments:  MEDIFICTS Score Key: >=70 Need to make dietary  changes  40-70 Heart Healthy Diet <= 40 Therapeutic Level Cholesterol Diet  Flowsheet Row CARDIAC REHAB PHASE II EXERCISE from 09/04/2023 in [facility] CARDIAC REHABILITATION  Picture Your Plate Total Score on Admission 61   Picture Your Plate Scores: <59 Unhealthy dietary pattern with much room for improvement. 41-50 Dietary pattern unlikely to meet recommendations for good health and room for improvement. 51-60 More healthful dietary pattern, with some room for improvement.  >60 Healthy dietary pattern, although there may be some specific behaviors that could be improved.    Nutrition Goals Re-Evaluation:   Nutrition Goals Discharge (Final Nutrition Goals Re-Evaluation):   Psychosocial: Target Goals: Acknowledge presence or absence of significant depression and/or stress, maximize coping skills, provide positive support system. Participant is able to verbalize types and ability to use techniques and skills needed for reducing stress and depression.  Initial Review & Psychosocial Screening:  Initial Psych Review & Screening - 09/04/23 1301       Initial Review   Current issues with Current Stress Concerns    Source of Stress Concerns Family;Chronic Illness;Unable to perform yard/household activities    Comments Shadasia has her mother moved in with her who is suffering from dementia so she is experiencing caregiver stress. She also states it takes her longer to do housework than it used to.      Family Dynamics   Good Support System? Yes    Comments Kaylean states her significant other is her support system. They have been engaged for several years now but have yet to get married.      Barriers   Psychosocial barriers to participate in program The patient should benefit from training in stress management and relaxation.      Screening Interventions   Interventions  To provide support and resources with identified psychosocial needs;Encouraged to exercise    Expected Outcomes  Short Term goal: Utilizing psychosocial counselor, staff and physician to assist with identification of specific Stressors or current issues interfering with healing process. Setting desired goal for each stressor or current issue identified.;Long Term Goal: Stressors or current issues are controlled or eliminated.;Short Term goal: Identification and review with participant of any Quality of Life or Depression concerns found by scoring the questionnaire.;Long Term goal: The participant improves quality of Life and PHQ9 Scores as seen by post scores and/or verbalization of changes          Quality of Life Scores:  Quality of Life - 09/04/23 1344       Quality of Life   Select Quality of Life      Quality of Life Scores   Health/Function Pre 17.2 %    Socioeconomic Pre 25.94 %    Psych/Spiritual Pre 28.29 %    Family Pre 6.6 %    GLOBAL Pre 19.9 %         Scores of 19 and below usually indicate a poorer quality of life in these areas.  A difference of  2-3 points is a clinically meaningful difference.  A difference of 2-3 points in the total score of the Quality of Life Index has been associated with significant improvement in overall quality of life, self-image, physical symptoms, and general health in studies assessing change in quality of life.  PHQ-9: Review Flowsheet  More data exists      09/04/2023 07/26/2023 03/27/2023 10/05/2022 03/22/2022  Depression screen PHQ 2/9  Decreased Interest 0 0 0 0 0  Down, Depressed, Hopeless 0 0 0 0 0  PHQ - 2 Score 0 0 0 0 0  Altered sleeping 1 0 - 0 -  Tired, decreased energy 1 0 - 0 -  Change in appetite 0 0 - 0 -  Feeling bad or failure about yourself  0 0 - 0 -  Trouble concentrating 0 0 - 0 -  Moving slowly or fidgety/restless 0 0 - 0 -  Suicidal thoughts 0 0 - 0 -  PHQ-9 Score 2 0 - 0 -  Difficult doing work/chores Not difficult at all Not difficult at all - Not difficult at all -   Interpretation of Total Score  Total Score  Depression Severity:  1-4 = Minimal depression, 5-9 = Mild depression, 10-14 = Moderate depression, 15-19 = Moderately severe depression, 20-27 = Severe depression   Psychosocial Evaluation and Intervention:  Psychosocial Evaluation - 09/04/23 1302       Psychosocial Evaluation & Interventions   Interventions Stress management education;Encouraged to exercise with the program and follow exercise prescription;Relaxation education    Comments Kathren is a pleasant lady who is eager to start the program because she wants to start feeling better and get her endurance and strength back. Her mobility is good although she says she gets SOB easily and it makes her have coughing spells which then causes her to get dizzy. She states that it takes her longer to do house and yard work now. Where it used to take her a day, now it may take her several days to do it. She has been experiencing some stress from that and some stress from her mother living with her who suffers from dementia, so she is undergoing some caregiver stress. She states that her significant other is a great support system for her. She doesn't currently do any  exercise as about a month ago she was doing silver sneakers but not anymore. She is retired and in her spare time she likes to garden and make floral arrangements.    Expected Outcomes Short: To feel better! Long: Increase strength and stamina.    Continue Psychosocial Services  Follow up required by staff          Psychosocial Re-Evaluation:   Psychosocial Discharge (Final Psychosocial Re-Evaluation):   Vocational Rehabilitation: Provide vocational rehab assistance to qualifying candidates.   Vocational Rehab Evaluation & Intervention:  Vocational Rehab - 09/04/23 1303       Initial Vocational Rehab Evaluation & Intervention   Assessment shows need for Vocational Rehabilitation No          Education: Education Goals: Education classes will be provided on a weekly  basis, covering required topics. Participant will state understanding/return demonstration of topics presented.  Learning Barriers/Preferences:  Learning Barriers/Preferences - 09/04/23 1302       Learning Barriers/Preferences   Learning Barriers None    Learning Preferences Audio;Computer/Internet;Group Instruction;Skilled Demonstration;Verbal Instruction;Video          Education Topics: Hypertension, Hypertension Reduction -Define heart disease and high blood pressure. Discus how high blood pressure affects the body and ways to reduce high blood pressure.   Exercise and Your Heart -Discuss why it is important to exercise, the FITT principles of exercise, normal and abnormal responses to exercise, and how to exercise safely. Flowsheet Row CARDIAC REHAB PHASE II EXERCISE from 10/09/2023 in [place] [facility] CARDIAC REH[facility]ILITATION  Date 10/09/23  Educator hb  Instruction Review Code 1- Verbalizes Understanding    Angina -Discuss definition of angina, causes of angina, treatment of angina, and how to decrease risk of having angina.   Cardiac Medications -Review what the following cardiac medications are used for, how they affect the body, and side effects that may occur when taking the medications.  Medications include Aspirin, Beta blockers, calcium channel blockers, ACE Inhibitors, angiotensin receptor blockers, diuretics, digoxin, and antihyperlipidemics. Flowsheet Row CARDIAC REHAB PHASE II EXERCISE from 10/09/2023 in [place] [facility] CARDIAC REHABILITATION  Date 09/18/23  Educator jh  Instruction Review Code 1- Verbalizes Understanding    Congestive Heart Failure -Discuss the definition of CHF, how to live with CHF, the signs and symptoms of CHF, and how keep track of weight and sodium intake.   Heart Disease and Intimacy -Discus the effect sexual activity has on the heart, how changes occur during intimacy as we age, and safety during sexual activity.   Smoking Cessation /  COPD -Discuss different methods to quit smoking, the health benefits of quitting smoking, and the definition of COPD.   Nutrition I: Fats -Discuss the types of cholesterol, what cholesterol does to the heart, and how cholesterol levels can be controlled.   Nutrition II: Labels -Discuss the different components of food labels and how to read food label   Heart Parts/Heart Disease and PAD -Discuss the anatomy of the heart, the pathway of blood circulation through the heart, and these are affected by heart disease.   Stress I: Signs and Symptoms -Discuss the causes of stress, how stress may lead to anxiety and depression, and ways to limit stress.   Stress II: Relaxation -Discuss different types of relaxation techniques to limit stress.   Warning Signs of Stroke / TIA -Discuss definition of a stroke, what the signs and symptoms are of a stroke, and how to identify when someone is having stroke.   Knowledge Questionnaire Score:  Knowledge Questionnaire  Score - 09/04/23 1345       Knowledge Questionnaire Score   Pre Score 25/26          Core Components/Risk Factors/Patient Goals at Admission:  Personal Goals and Risk Factors at Admission - 09/04/23 1303       Core Components/Risk Factors/Patient Goals on Admission   Improve shortness of breath with ADL's Yes    Intervention Provide education, individualized exercise plan and daily activity instruction to help decrease symptoms of SOB with activities of daily living.    Expected Outcomes Short Term: Improve cardiorespiratory fitness to achieve a reduction of symptoms when performing ADLs;Long Term: Be able to perform more ADLs without symptoms or delay the onset of symptoms    Hypertension Yes    Intervention Provide education on lifestyle modifcations including regular physical activity/exercise, weight management, moderate sodium restriction and increased consumption of fresh fruit, vegetables, and low fat dairy, alcohol  moderation, and smoking cessation.;Monitor prescription use compliance.    Expected Outcomes Short Term: Continued assessment and intervention until BP is < 140/53mm HG in hypertensive participants. < 130/81mm HG in hypertensive participants with diabetes, heart failure or chronic kidney disease.;Long Term: Maintenance of blood pressure at goal levels.    Lipids Yes    Intervention Provide education and support for participant on nutrition & aerobic/resistive exercise along with prescribed medications to achieve LDL 70mg , HDL >40mg .    Expected Outcomes Long Term: Cholesterol controlled with medications as prescribed, with individualized exercise RX and with personalized nutrition plan. Value goals: LDL < 70mg , HDL > 40 mg.;Short Term: Participant states understanding of desired cholesterol values and is compliant with medications prescribed. Participant is following exercise prescription and nutrition guidelines.    Stress Yes    Intervention Offer individual and/or small group education and counseling on adjustment to heart disease, stress management and health-related lifestyle change. Teach and support self-help strategies.;Refer participants experiencing significant psychosocial distress to appropriate mental health specialists for further evaluation and treatment. When possible, include family members and significant others in education/counseling sessions.    Expected Outcomes Short Term: Participant demonstrates changes in health-related behavior, relaxation and other stress management skills, ability to obtain effective social support, and compliance with psychotropic medications if prescribed.;Long Term: Emotional wellbeing is indicated by absence of clinically significant psychosocial distress or social isolation.          Core Components/Risk Factors/Patient Goals Review:    Core Components/Risk Factors/Patient Goals at Discharge (Final Review):    ITP Comments:  ITP Comments      Row Name 09/04/23 1320 09/09/23 0938 09/11/23 0829 10/09/23 0821 10/30/23 1015   ITP Comments Patient attend orientation today.  Patient is attending Cardiac Rehabilitation Program.  Documentation for diagnosis can be found in [facility].  Reviewed medical chart, RPE/RPD, gym safety, and program guidelines.  Patient was fitted to equipment they will be using during rehab.  Patient is scheduled to start exercise on 09/09/23.   Initial ITP created and sent for review and signature by Dr. Dorn Ross, Medical Director for Cardiac Rehabilitation Program. First full day of exercise!  Patient was oriented to gym and equipment including functions, settings, policies, and procedures.  Patient's individual exercise prescription and treatment plan were reviewed.  All starting workloads were established based on the results of the 6 minute walk test done at initial orientation visit.  The plan for exercise progression was also introduced and progression will be customized based on patient's performance and goals. 30 day review completed. ITP sent to  Dr. Dorn Ross, Medical Director of Cardiac Rehab. Continue with ITP unless changes are made by physician.    New to program 30 day review completed. ITP sent to Dr. Dorn Ross, Medical Director of Cardiac Rehab. Continue with ITP unless changes are made by physician. Called patient regarding attendance to CR. She said she had to go out of town to help her mother and plans to be back in [place] next week. She plans to come Monday 7/14.    Row Name 11/06/23 0831 12/04/23 1014         ITP Comments 30 day review completed. ITP sent to Dr. Dorn Ross, Medical Director of Cardiac Rehab. Continue with ITP unless changes are made by physician.  Pt has been out of town with her mom but has not returned yet.  Last attended on 10/21/23.  Unable to assess goals this round. Pt never returned our calls or respoded to letter.  We will discharge her at this time.          Comments: Discharge ITP

## 2023-12-09 ENCOUNTER — Encounter (HOSPITAL_COMMUNITY)
Admission: RE | Admit: 2023-12-09 | Discharge: 2023-12-09 | Disposition: A | Discharge status: Home / Self Care | Source: Ambulatory Visit | Attending: Cardiology | Admitting: Cardiology

## 2023-12-09 DIAGNOSIS — R0609 Other forms of dyspnea: Secondary | ICD-10-CM | POA: Insufficient documentation

## 2023-12-09 DIAGNOSIS — I25118 Atherosclerotic heart disease of native coronary artery with other forms of angina pectoris: Secondary | ICD-10-CM | POA: Insufficient documentation

## 2023-12-09 NOTE — Progress Notes (Signed)
 Discharge Progress Report  Patient Details  Name: Courtney White MRN: [id number] Date of Birth: [birth year] Referring Provider:   Flowsheet Row CARDIAC REHAB PHASE II EXERCISE from 09/04/2023 in [facility] CARD[facility]AC RE[facility]  Referring Provider Elmira Penman MD     Number of Visits: 15  Reason for Discharge:  Early Exit:  Lack of attendance  Smoking History:  Social History   Tobacco Use  Smoking Status Never  Smokeless Tobacco Never    Diagnosis:  No diagnosis found.  ADL [facility]:   Initial Exercise Prescription:  Initial Exercise Prescription - 09/04/23 1300       Date of Initial Exercise RX and Referring Provider   Date 09/04/23    Referring Provider Elmira Penman MD      Treadmill   MPH 2    Grade 0.5    Minutes 15    METs 2.67      NuStep   Level 2    SPM 50    Minutes 15    METs 2      Prescription Details   Frequency (times per week) 2    Duration Progress to 30 minutes of continuous aerobic without signs/symptoms of physical distress      Intensity   THRR 40-80% of Max Heartrate 109-138    Ratings of Perceived Exertion 11-13    Perceived Dyspnea 0-4      Resistance Training   Training Prescription Yes    Weight 4    Reps 10-15          Discharge Exercise Prescription (Final Exercise Prescription Changes):  Exercise Prescription Changes - 10/14/23 1500       Response to Exercise   Blood Pressure (Admit) 110/70    Blood Pressure (Exercise) 150/70    Blood Pressure (Exit) 106/68    Heart Rate (Admit) 67 bpm    Heart Rate (Exercise) 137 bpm    Heart Rate (Exit) 101 bpm    Rating of Perceived Exertion (Exercise) 12    Perceived Dyspnea (Exercise) 0    Duration Continue with 30 min of aerobic exercise without signs/symptoms of physical distress.    Intensity THRR unchanged      Progression   Progression Continue to progress workloads to maintain intensity without signs/symptoms of physical distress.      Resistance  Training   Training Prescription Yes    Weight 4    Reps 10-15      Treadmill   MPH 3.2    Grade 0.5    Minutes 15    METs 3.67      NuStep   Level 5    SPM 100    Minutes 15    METs 3          Functional Capacity:  6 Minute Walk     Row Name 09/04/23 1339         6 Minute Walk   Phase Initial     Distance 1435 feet     Walk Time 6 minutes     # of Rest Breaks 0     MPH 2.72     METS 3.17     RPE 12     Perceived Dyspnea  0     VO2 Peak 11.11     Symptoms No     Resting HR 80 bpm     Resting BP 130/80     Resting Oxygen Saturation  97 %     Exercise Oxygen Saturation  during  6 min walk 98 %     Max Ex. HR 109 bpm     Max Ex. BP 136/82     2 Minute Post BP 128/80        Psychological, QOL, Others - Outcomes: PHQ 2/9:    09/04/2023   12:56 PM 07/26/2023   10:34 AM 03/27/2023    9:56 AM 10/05/2022    1:25 PM 03/22/2022    8:19 AM  Depression screen PHQ 2/9  Decreased Interest 0 0 0 0 0  Down, Depressed, Hopeless 0 0 0 0 0  PHQ - 2 Score 0 0 0 0 0  Altered sleeping 1 0  0   Tired, decreased energy 1 0  0   Change in appetite 0 0  0   Feeling bad or failure about yourself  0 0  0   Trouble concentrating 0 0  0   Moving slowly or fidgety/restless 0 0  0   Suicidal thoughts 0 0  0   PHQ-9 Score 2 0  0   Difficult doing work/chores Not difficult at all Not difficult at all  Not difficult at all     Quality of Life:  Quality of Life - 09/04/23 1344       Quality of Life   Select Quality of Life      Quality of Life Scores   Health/Function Pre 17.2 %    Socioeconomic Pre 25.94 %    Psych/Spiritual Pre 28.29 %    Family Pre 6.6 %    GLOBAL Pre 19.9 %          Personal Goals: Goals established at orientation with interventions provided to work toward goal.  Personal Goals and Risk Factors at Admission - 09/04/23 1303       Core Components/Risk Factors/Patient Goals on Admission   Improve shortness of breath with ADL's Yes     Intervention Provide education, individualized exercise plan and daily activity instruction to help decrease symptoms of SOB with activities of daily living.    Expected Outcomes Short Term: Improve cardiorespiratory fitness to achieve a reduction of symptoms when performing ADLs;Long Term: Be able to perform more ADLs without symptoms or delay the onset of symptoms    Hypertension Yes    Intervention Provide education on lifestyle modifcations including regular physical activity/exercise, weight management, moderate sodium restriction and increased consumption of fresh fruit, vegetables, and low fat dairy, alcohol moderation, and smoking cessation.;Monitor prescription use compliance.    Expected Outcomes Short Term: Continued assessment and intervention until BP is < 140/22mm HG in hypertensive participants. < 130/52mm HG in hypertensive participants with diabetes, heart failure or chronic kidney disease.;Long Term: Maintenance of blood pressure at goal levels.    Lipids Yes    Intervention Provide education and support for participant on nutrition & aerobic/resistive exercise along with prescribed medications to achieve LDL 70mg , HDL >40mg .    Expected Outcomes Long Term: Cholesterol controlled with medications as prescribed, with individualized exercise RX and with personalized nutrition plan. Value goals: LDL < 70mg , HDL > 40 mg.;Short Term: Participant states understanding of desired cholesterol values and is compliant with medications prescribed. Participant is following exercise prescription and nutrition guidelines.    Stress Yes    Intervention Offer individual and/or small group education and counseling on adjustment to heart disease, stress management and health-related lifestyle change. Teach and support self-help strategies.;Refer participants experiencing significant psychosocial distress to appropriate mental health specialists for further evaluation and treatment. When possible,  include  family members and significant others in education/counseling sessions.    Expected Outcomes Short Term: Participant demonstrates changes in health-related behavior, relaxation and other stress management skills, ability to obtain effective social support, and compliance with psychotropic medications if prescribed.;Long Term: Emotional wellbeing is indicated by absence of clinically significant psychosocial distress or social isolation.           Personal Goals Discharge:   Exercise Goals and Review:  Exercise Goals     Row Name 09/04/23 1324             Exercise Goals   Increase Physical Activity Yes       Intervention Provide advice, education, support and counseling about physical activity/exercise needs.;Develop an individualized exercise prescription for aerobic and resistive training based on initial evaluation findings, risk stratification, comorbidities and participant's personal goals.       Expected Outcomes Short Term: Attend rehab on a regular basis to increase amount of physical activity.;Long Term: Add in home exercise to make exercise part of routine and to increase amount of physical activity.;Long Term: Exercising regularly at least 3-5 days a week.       Increase Strength and Stamina Yes       Intervention Provide advice, education, support and counseling about physical activity/exercise needs.;Develop an individualized exercise prescription for aerobic and resistive training based on initial evaluation findings, risk stratification, comorbidities and participant's personal goals.       Expected Outcomes Short Term: Increase workloads from initial exercise prescription for resistance, speed, and METs.;Short Term: Perform resistance training exercises routinely during rehab and add in resistance training at home;Long Term: Improve cardiorespiratory fitness, muscular endurance and strength as measured by increased METs and functional capacity ( )       Able to understand  and use rate of perceived exertion (RPE) scale Yes       Intervention Provide education and explanation on how to use RPE scale       Expected Outcomes Short Term: Able to use RPE daily in rehab to express subjective intensity level;Long Term:  Able to use RPE to guide intensity level when exercising independently       Able to understand and use Dyspnea scale Yes       Intervention Provide education and explanation on how to use Dyspnea scale       Expected Outcomes Short Term: Able to use Dyspnea scale daily in rehab to express subjective sense of shortness of breath during exertion;Long Term: Able to use Dyspnea scale to guide intensity level when exercising independently       Knowledge and understanding of Target Heart Rate Range (THRR) Yes       Intervention Provide education and explanation of THRR including how the numbers were predicted and where they are located for reference       Expected Outcomes Short Term: Able to state/look up THRR;Long Term: Able to use THRR to govern intensity when exercising independently;Short Term: Able to use daily as guideline for intensity in rehab       Able to check pulse independently Yes       Intervention Provide education and demonstration on how to check pulse in carotid and radial arteries.;Review the importance of being able to check your own pulse for safety during independent exercise       Expected Outcomes Short Term: Able to explain why pulse checking is important during independent exercise;Long Term: Able to check pulse independently and accurately  Understanding of Exercise Prescription Yes       Intervention Provide education, explanation, and written materials on patient's individual exercise prescription       Expected Outcomes Short Term: Able to explain program exercise prescription;Long Term: Able to explain home exercise prescription to exercise independently          Exercise Goals Re-Evaluation:  Exercise Goals Re-Evaluation      Row Name 09/09/23 [phone]             Exercise Goal Re-Evaluation   Exercise Goals Review Understanding of Exercise Prescription;Able to understand and use rate of perceived exertion (RPE) scale;Able to understand and use Dyspnea scale;Knowledge and understanding of Target Heart Rate Range (THRR)       Comments Reviewed RPE and dyspnea scale, THR and program prescription with pt today.  Pt voiced understanding and was given a copy of goals to take home.       Expected Outcomes Short: Use RPE daily to regulate intensity.  Long: Follow program prescription in THR.          Nutrition & Weight - Outcomes:  Pre Biometrics - 09/04/23 1342       Pre Biometrics   Height 5' 7 (1.702 m)    Weight 79.8 kg    Waist Circumference 37 inches    Hip Circumference 41 inches    Waist to Hip Ratio 0.9 %    BMI (Calculated) 27.55    Grip Strength 15.1 kg    Single Leg Stand 10.4 seconds           Nutrition:  Nutrition Therapy & Goals - 09/04/23 1302       Intervention Plan   Intervention Prescribe, educate and counsel regarding individualized specific dietary modifications aiming towards targeted core components such as weight, hypertension, lipid management, diabetes, heart failure and other comorbidities.;Nutrition handout(s) given to patient.    Expected Outcomes Short Term Goal: Understand basic principles of dietary content, such as calories, fat, sodium, cholesterol and nutrients.;Short Term Goal: A plan has been developed with personal nutrition goals set during dietitian appointment.;Long Term Goal: Adherence to prescribed nutrition plan.          Nutrition Discharge:   Education Questionnaire Score:  Knowledge Questionnaire Score - 09/04/23 1345       Knowledge Questionnaire Score   Pre Score 25/26          Goals reviewed with patient; copy given to patient.

## 2023-12-09 NOTE — Progress Notes (Signed)
 Cardiac Individual Treatment Plan  Patient Details  Name: Courtney White MRN: [id number] Date of Birth: 07-Nov-1955 Referring Provider:   Flowsheet Row CARDIAC REHAB PHASE II EXERCISE from 09/04/2023 in [facility]  Referring Provider Elmira Penman MD    Initial Encounter Date:  Flowsheet Row CARDIAC REHAB PHASE II EXERCISE from 09/04/2023 in [facility]  Date 09/04/23    Visit Diagnosis: No diagnosis found.  Patient's Home Medications on Admission:  Current Outpatient Medications:    acetaminophen  (TYLENOL ) 325 MG tablet, Take 650 mg by mouth every 6 (six) hours as needed for moderate pain., Disp: , Rfl:    amLODipine  (NORVASC ) 5 MG tablet, Take 1 tablet (5 mg total) by mouth daily., Disp: 100 tablet, Rfl: 3   aspirin  EC (ASPIRIN  LOW DOSE) 81 MG tablet, TAKE 1 TABLET (81 MG TOTAL) BY MOUTH DAILY. SWALLOW WHOLE., Disp: 90 tablet, Rfl: 1   Carboxymethylcellul-Glycerin (LUBRICATING EYE DROPS OP), Place 1 drop into both eyes daily as needed (dry eyes)., Disp: , Rfl:    diclofenac Sodium (VOLTAREN) 1 % GEL, Apply 1 Application topically 3 (three) times daily as needed (joint pain)., Disp: , Rfl:    hydrochlorothiazide  (HYDRODIURIL ) 25 MG tablet, Take 0.5 tablets (12.5 mg total) by mouth daily., Disp: 45 tablet, Rfl: 3   ibuprofen (ADVIL) 200 MG tablet, Take 400 mg by mouth every 8 (eight) hours as needed (pain)., Disp: , Rfl:    levothyroxine  (SYNTHROID ) 112 MCG tablet, TAKE 1 TABLET BY MOUTH EVERY DAY, Disp: 100 tablet, Rfl: 0   metoprolol  succinate (TOPROL -XL) 25 MG 24 hr tablet, Take 1 tablet (25 mg total) by mouth daily. TAKE WITH OR IMMEDIATELY FOLLOWING A MEAL., Disp: 100 tablet, Rfl: 3   nitroGLYCERIN  (NITROSTAT ) 0.4 MG SL tablet, PLACE 1 TABLET UNDER THE TONGUE EVERY 5 MINUTES AS NEEDED FOR CHEST PAIN., Disp: 75 tablet, Rfl: 2   rosuvastatin  (CRESTOR ) 10 MG tablet, Take 1 tablet (10 mg total) by mouth daily., Disp: 100 tablet, Rfl: 3    tirzepatide  (ZEPBOUND ) 2.5 MG/0.5ML injection vial, Inject 2.5 mg into the skin once a week., Disp: 2 mL, Rfl: 0   Vitamin D , Ergocalciferol , (DRISDOL ) 1.25 MG (50000 UNIT) CAPS capsule, TAKE 1 CAPSULE (50,000 UNITS TOTAL) BY MOUTH EVERY MONDAY., Disp: 12 capsule, Rfl: 3  Past Medical History: Past Medical History:  Diagnosis Date   COVID-19 12/02/2020   Hepatitis C 1992   s/p treatment.  she has cleared virus   HTN (hypertension)    Thyroid  disease    Vitiligo     Tobacco Use: Social History   Tobacco Use  Smoking Status Never  Smokeless Tobacco Never    Labs: Review Flowsheet  More data exists      Latest Ref Rng & Units 07/07/2021 09/26/2022 10/05/2022 12/27/2022 07/26/2023  Labs for ITP Cardiac and Pulmonary Rehab  Cholestrol 100 - 199 mg/dL 856  853  - 859  -  LDL (calc) 0 - 99 mg/dL 71  75  - 69  -  HDL-C >39 mg/dL 56  53  - 55  -  Trlycerides 0 - 149 mg/dL 82  94  - 81  -  Hemoglobin A1c 4.8 - 5.6 % - - 5.6  6.0  6.2      Exercise Target Goals: Exercise Program Goal: Individual exercise prescription set using results from initial 6 min walk test and THRR while considering  patient's activity barriers and safety.   Exercise Prescription Goal: Initial exercise prescription builds  to 30-45 minutes a day of aerobic activity, 2-3 days per week.  Home exercise guidelines will be given to patient during program as part of exercise prescription that the participant will acknowledge.   Education: Aerobic Exercise: - Group verbal and visual presentation on the components of exercise prescription. Introduces F.I.T.T principle from ACSM for exercise prescriptions.  Reviews F.I.T.T. principles of aerobic exercise including progression. Written material provided at class time.   Education: Resistance Exercise: - Group verbal and visual presentation on the components of exercise prescription. Introduces F.I.T.T principle from ACSM for exercise prescriptions  Reviews F.I.T.T.  principles of resistance exercise including progression. Written material provided at class time.    Education: Exercise & Equipment Safety: - Individual verbal instruction and demonstration of equipment use and safety with use of the equipment.   Education: Exercise Physiology & General Exercise Guidelines: - Group verbal and written instruction with models to review the exercise physiology of the cardiovascular system and associated critical values. Provides general exercise guidelines with specific guidelines to those with heart or lung disease. Written material provided at class time.   Education: Flexibility, Balance, Mind/Body Relaxation: - Group verbal and visual presentation with interactive activity on the components of exercise prescription. Introduces F.I.T.T principle from ACSM for exercise prescriptions. Reviews F.I.T.T. principles of flexibility and balance exercise training including progression. Also discusses the mind body connection.  Reviews various relaxation techniques to help reduce and manage stress (i.e. Deep breathing, progressive muscle relaxation, and visualization). Balance handout provided to take home. Written material provided at class time.   Activity Barriers & Risk Stratification:  Activity Barriers & Cardiac Risk Stratification - 09/04/23 1303       Activity Barriers & Cardiac Risk Stratification   Activity Barriers Shortness of Breath;Balance Concerns;Chest Pain/Angina    Cardiac Risk Stratification Moderate          6 Minute Walk:  6 Minute Walk     Row Name 09/04/23 1339         6 Minute Walk   Phase Initial     Distance 1435 feet     Walk Time 6 minutes     # of Rest Breaks 0     MPH 2.72     METS 3.17     RPE 12     Perceived Dyspnea  0     VO2 Peak 11.11     Symptoms No     Resting HR 80 bpm     Resting BP 130/80     Resting Oxygen Saturation  97 %     Exercise Oxygen Saturation  during 6 min walk 98 %     Max Ex. HR 109 bpm      Max Ex. BP 136/82     2 Minute Post BP 128/80        Oxygen Initial Assessment:   Oxygen Re-Evaluation:   Oxygen Discharge (Final Oxygen Re-Evaluation):   Initial Exercise Prescription:  Initial Exercise Prescription - 09/04/23 1300       Date of Initial Exercise RX and Referring Provider   Date 09/04/23    Referring Provider Elmira Penman MD      Treadmill   MPH 2    Grade 0.5    Minutes 15    METs 2.67      NuStep   Level 2    SPM 50    Minutes 15    METs 2      Prescription Details   Frequency (times per  week) 2    Duration Progress to 30 minutes of continuous aerobic without signs/symptoms of physical distress      Intensity   THRR 40-80% of Max Heartrate 109-138    Ratings of Perceived Exertion 11-13    Perceived Dyspnea 0-4      Resistance Training   Training Prescription Yes    Weight 4    Reps 10-15          Perform Capillary Blood Glucose checks as needed.  Exercise Prescription Changes:   Exercise Prescription Changes     Row Name 09/04/23 1300 09/18/23 1300 10/14/23 1500         Response to Exercise   Blood Pressure (Admit) 130/80 126/60 110/70     Blood Pressure (Exercise) 136/82 132/60 150/70     Blood Pressure (Exit) 128/80 108/60 106/68     Heart Rate (Admit) 80 bpm 81 bpm 67 bpm     Heart Rate (Exercise) 109 bpm 114 bpm 137 bpm     Heart Rate (Exit) 85 bpm 101 bpm 101 bpm     Oxygen Saturation (Admit) 97 % -- --     Oxygen Saturation (Exercise) 98 % -- --     Oxygen Saturation (Exit) 98 % -- --     Rating of Perceived Exertion (Exercise) 12 12 12      Perceived Dyspnea (Exercise) 0 0 0     Duration -- Continue with 30 min of aerobic exercise without signs/symptoms of physical distress. Continue with 30 min of aerobic exercise without signs/symptoms of physical distress.     Intensity -- THRR unchanged THRR unchanged       Progression   Progression -- Continue to progress workloads to maintain intensity without  signs/symptoms of physical distress. Continue to progress workloads to maintain intensity without signs/symptoms of physical distress.       Resistance Training   Training Prescription -- Yes Yes     Weight -- 4 4     Reps -- 10-15 10-15       Treadmill   MPH -- 3 3.2     Grade -- 0.5 0.5     Minutes -- 15 15     METs -- 3.5 3.67       NuStep   Level -- 2 5     SPM -- 102 100     Minutes -- 15 15     METs -- 3.1 3        Exercise Comments:   Exercise Comments     Row Name 09/04/23 1324 09/09/23 [id number]         Exercise Comments Patient states she used to do the silver sneakers program up until about a month ago. Does not do much exercise now. First full day of exercise!  Patient was oriented to gym and equipment including functions, settings, policies, and procedures.  Patient's individual exercise prescription and treatment plan were reviewed.  All starting workloads were established based on the results of the 6 minute walk test done at initial orientation visit.  The plan for exercise progression was also introduced and progression will be customized based on patient's performance and goals.         Exercise Goals and Review:   Exercise Goals     Row Name 09/04/23 1324             Exercise Goals   Increase Physical Activity Yes       Intervention Provide advice, education, support and  counseling about physical activity/exercise needs.;Develop an individualized exercise prescription for aerobic and resistive training based on initial evaluation findings, risk stratification, comorbidities and participant's personal goals.       Expected Outcomes Short Term: Attend rehab on a regular basis to increase amount of physical activity.;Long Term: Add in home exercise to make exercise part of routine and to increase amount of physical activity.;Long Term: Exercising regularly at least 3-5 days a week.       Increase Strength and Stamina Yes       Intervention Provide advice,  education, support and counseling about physical activity/exercise needs.;Develop an individualized exercise prescription for aerobic and resistive training based on initial evaluation findings, risk stratification, comorbidities and participant's personal goals.       Expected Outcomes Short Term: Increase workloads from initial exercise prescription for resistance, speed, and METs.;Short Term: Perform resistance training exercises routinely during rehab and add in resistance training at home;Long Term: Improve cardiorespiratory fitness, muscular endurance and strength as measured by increased METs and functional capacity ( )       Able to understand and use rate of perceived exertion (RPE) scale Yes       Intervention Provide education and explanation on how to use RPE scale       Expected Outcomes Short Term: Able to use RPE daily in rehab to express subjective intensity level;Long Term:  Able to use RPE to guide intensity level when exercising independently       Able to understand and use Dyspnea scale Yes       Intervention Provide education and explanation on how to use Dyspnea scale       Expected Outcomes Short Term: Able to use Dyspnea scale daily in rehab to express subjective sense of shortness of breath during exertion;Long Term: Able to use Dyspnea scale to guide intensity level when exercising independently       Knowledge and understanding of Target Heart Rate Range (THRR) Yes       Intervention Provide education and explanation of THRR including how the numbers were predicted and where they are located for reference       Expected Outcomes Short Term: Able to state/look up THRR;Long Term: Able to use THRR to govern intensity when exercising independently;Short Term: Able to use daily as guideline for intensity in rehab       Able to check pulse independently Yes       Intervention Provide education and demonstration on how to check pulse in carotid and radial arteries.;Review the  importance of being able to check your own pulse for safety during independent exercise       Expected Outcomes Short Term: Able to explain why pulse checking is important during independent exercise;Long Term: Able to check pulse independently and accurately       Understanding of Exercise Prescription Yes       Intervention Provide education, explanation, and written materials on patient's individual exercise prescription       Expected Outcomes Short Term: Able to explain program exercise prescription;Long Term: Able to explain home exercise prescription to exercise independently          Exercise Goals Re-Evaluation :  Exercise Goals Re-Evaluation     Row Name 09/09/23 [phone]             Exercise Goal Re-Evaluation   Exercise Goals Review Understanding of Exercise Prescription;Able to understand and use rate of perceived exertion (RPE) scale;Able to understand and use Dyspnea scale;Knowledge and understanding of  Target Heart Rate Range (THRR)       Comments Reviewed RPE and dyspnea scale, THR and program prescription with pt today.  Pt voiced understanding and was given a copy of goals to take home.       Expected Outcomes Short: Use RPE daily to regulate intensity.  Long: Follow program prescription in THR.          Discharge Exercise Prescription (Final Exercise Prescription Changes):  Exercise Prescription Changes - 10/14/23 1500       Response to Exercise   Blood Pressure (Admit) 110/70    Blood Pressure (Exercise) 150/70    Blood Pressure (Exit) 106/68    Heart Rate (Admit) 67 bpm    Heart Rate (Exercise) 137 bpm    Heart Rate (Exit) 101 bpm    Rating of Perceived Exertion (Exercise) 12    Perceived Dyspnea (Exercise) 0    Duration Continue with 30 min of aerobic exercise without signs/symptoms of physical distress.    Intensity THRR unchanged      Progression   Progression Continue to progress workloads to maintain intensity without signs/symptoms of physical  distress.      Resistance Training   Training Prescription Yes    Weight 4    Reps 10-15      Treadmill   MPH 3.2    Grade 0.5    Minutes 15    METs 3.67      NuStep   Level 5    SPM 100    Minutes 15    METs 3          Nutrition:  Target Goals: Understanding of nutrition guidelines, daily intake of sodium 1500mg , cholesterol 200mg , calories 30% from fat and 7% or less from saturated fats, daily to have 5 or more servings of fruits and vegetables.  Education: Nutrition 1 -Group instruction provided by verbal, written material, interactive activities, discussions, models, and posters to present general guidelines for heart healthy nutrition including macronutrients, label reading, and promoting whole foods over processed counterparts. Education serves as Pensions consultant of discussion of heart healthy eating for all. Written material provided at class time.    Education: Nutrition 2 -Group instruction provided by verbal, written material, interactive activities, discussions, models, and posters to present general guidelines for heart healthy nutrition including sodium, cholesterol, and saturated fat. Providing guidance of habit forming to improve blood pressure, cholesterol, and body weight. Written material provided at class time.     Biometrics:  Pre Biometrics - 09/04/23 1342       Pre Biometrics   Height 5' 7 (1.702 m)    Weight 79.8 kg    Waist Circumference 37 inches    Hip Circumference 41 inches    Waist to Hip Ratio 0.9 %    BMI (Calculated) 27.55    Grip Strength 15.1 kg    Single Leg Stand 10.4 seconds           Nutrition Therapy Plan and Nutrition Goals:  Nutrition Therapy & Goals - 09/04/23 1302       Intervention Plan   Intervention Prescribe, educate and counsel regarding individualized specific dietary modifications aiming towards targeted core components such as weight, hypertension, lipid management, diabetes, heart failure and other  comorbidities.;Nutrition handout(s) given to patient.    Expected Outcomes Short Term Goal: Understand basic principles of dietary content, such as calories, fat, sodium, cholesterol and nutrients.;Short Term Goal: A plan has been developed with personal nutrition goals set during dietitian appointment.;Long Term  Goal: Adherence to prescribed nutrition plan.          Nutrition Assessments:  MEDIFICTS Score Key: >=70 Need to make dietary changes  40-70 Heart Healthy Diet <= 40 Therapeutic Level Cholesterol Diet  Flowsheet Row CARDIAC REHAB PHASE II EXERCISE from 09/04/2023 in [facility]  Picture Your Plate Total Score on Admission 61   Picture Your Plate Scores: <59 Unhealthy dietary pattern with much room for improvement. 41-50 Dietary pattern unlikely to meet recommendations for good health and room for improvement. 51-60 More healthful dietary pattern, with some room for improvement.  >60 Healthy dietary pattern, although there may be some specific behaviors that could be improved.    Nutrition Goals Re-Evaluation:   Nutrition Goals Discharge (Final Nutrition Goals Re-Evaluation):   Psychosocial: Target Goals: Acknowledge presence or absence of significant depression and/or stress, maximize coping skills, provide positive support system. Participant is able to verbalize types and ability to use techniques and skills needed for reducing stress and depression.   Education: Stress, Anxiety, and Depression - Group verbal and visual presentation to define topics covered.  Reviews how body is impacted by stress, anxiety, and depression.  Also discusses healthy ways to reduce stress and to treat/manage anxiety and depression. Written material provided at class time.   Education: Sleep Hygiene -Provides group verbal and written instruction about how sleep can affect your health.  Define sleep hygiene, discuss sleep cycles and impact of sleep habits. Review good  sleep hygiene tips.   Initial Review & Psychosocial Screening:  Initial Psych Review & Screening - 09/04/23 1301       Initial Review   Current issues with Current Stress Concerns    Source of Stress Concerns Family;Chronic Illness;Unable to perform yard/household activities    Comments Irena has her mother moved in with her who is suffering from dementia so she is experiencing caregiver stress. She also states it takes her longer to do housework than it used to.      Family Dynamics   Good Support System? Yes    Comments Chiara states her significant other is her support system. They have been engaged for several years now but have yet to get married.      Barriers   Psychosocial barriers to participate in program The patient should benefit from training in stress management and relaxation.      Screening Interventions   Interventions To provide support and resources with identified psychosocial needs;Encouraged to exercise    Expected Outcomes Short Term goal: Utilizing psychosocial counselor, staff and physician to assist with identification of specific Stressors or current issues interfering with healing process. Setting desired goal for each stressor or current issue identified.;Long Term Goal: Stressors or current issues are controlled or eliminated.;Short Term goal: Identification and review with participant of any Quality of Life or Depression concerns found by scoring the questionnaire.;Long Term goal: The participant improves quality of Life and PHQ9 Scores as seen by post scores and/or verbalization of changes          Quality of Life Scores:   Quality of Life - 09/04/23 1344       Quality of Life   Select Quality of Life      Quality of Life Scores   Health/Function Pre 17.2 %    Socioeconomic Pre 25.94 %    Psych/Spiritual Pre 28.29 %    Family Pre 6.6 %    GLOBAL Pre 19.9 %         Scores  of 19 and below usually indicate a poorer quality of life in these  areas.  A difference of  2-3 points is a clinically meaningful difference.  A difference of 2-3 points in the total score of the Quality of Life Index has been associated with significant improvement in overall quality of life, self-image, physical symptoms, and general health in studies assessing change in quality of life.  PHQ-9: Review Flowsheet  More data exists      09/04/2023 07/26/2023 03/27/2023 10/05/2022 03/22/2022  Depression screen PHQ 2/9  Decreased Interest 0 0 0 0 0  Down, Depressed, Hopeless 0 0 0 0 0  PHQ - 2 Score 0 0 0 0 0  Altered sleeping 1 0 - 0 -  Tired, decreased energy 1 0 - 0 -  Change in appetite 0 0 - 0 -  Feeling bad or failure about yourself  0 0 - 0 -  Trouble concentrating 0 0 - 0 -  Moving slowly or fidgety/restless 0 0 - 0 -  Suicidal thoughts 0 0 - 0 -  PHQ-9 Score 2 0 - 0 -  Difficult doing work/chores Not difficult at all Not difficult at all - Not difficult at all -   Interpretation of Total Score  Total Score Depression Severity:  1-4 = Minimal depression, 5-9 = Mild depression, 10-14 = Moderate depression, 15-19 = Moderately severe depression, 20-27 = Severe depression   Psychosocial Evaluation and Intervention:  Psychosocial Evaluation - 09/04/23 1302       Psychosocial Evaluation & Interventions   Interventions Stress management education;Encouraged to exercise with the program and follow exercise prescription;Relaxation education    Comments Clydine is a pleasant lady who is eager to start the program because she wants to start feeling better and get her endurance and strength back. Her mobility is good although she says she gets SOB easily and it makes her have coughing spells which then causes her to get dizzy. She states that it takes her longer to do house and yard work now. Where it used to take her a day, now it may take her several days to do it. She has been experiencing some stress from that and some stress from her mother living with her  who suffers from dementia, so she is undergoing some caregiver stress. She states that her significant other is a great support system for her. She doesn't currently do any exercise as about a month ago she was doing silver sneakers but not anymore. She is retired and in her spare time she likes to garden and make floral arrangements.    Expected Outcomes Short: To feel better! Long: Increase strength and stamina.    Continue Psychosocial Services  Follow up required by staff          Psychosocial Re-Evaluation:   Psychosocial Discharge (Final Psychosocial Re-Evaluation):   Vocational Rehabilitation: Provide vocational rehab assistance to qualifying candidates.   Vocational Rehab Evaluation & Intervention:  Vocational Rehab - 09/04/23 1303       Initial Vocational Rehab Evaluation & Intervention   Assessment shows need for Vocational Rehabilitation No          Education: Education Goals: Education classes will be provided on a variety of topics geared toward better understanding of heart health and risk factor modification. Participant will state understanding/return demonstration of topics presented as noted by education test scores.  Learning Barriers/Preferences:  Learning Barriers/Preferences - 09/04/23 1302       Learning Barriers/Preferences   Learning  Barriers None    Learning Preferences Audio;Computer/Internet;Group Instruction;Skilled Demonstration;Verbal Instruction;Video          General Cardiac Education Topics:  AED/CPR: - Group verbal and written instruction with the use of models to demonstrate the basic use of the AED with the basic ABC's of resuscitation.   Test and Procedures: - Group verbal and visual presentation and models provide information about basic cardiac anatomy and function. Reviews the testing methods done to diagnose heart disease and the outcomes of the test results. Describes the treatment choices: Medical Management, Angioplasty,  or Coronary Bypass Surgery for treating various heart conditions including Myocardial Infarction, Angina, Valve Disease, and Cardiac Arrhythmias. Written material provided at class time.   Medication Safety: - Group verbal and visual instruction to review commonly prescribed medications for heart and lung disease. Reviews the medication, class of the drug, and side effects. Includes the steps to properly store meds and maintain the prescription regimen. Written material provided at class time.   Intimacy: - Group verbal instruction through game format to discuss how heart and lung disease can affect sexual intimacy. Written material provided at class time.   Know Your Numbers and Heart Failure: - Group verbal and visual instruction to discuss disease risk factors for cardiac and pulmonary disease and treatment options.  Reviews associated critical values for Overweight/Obesity, Hypertension, Cholesterol, and Diabetes.  Discusses basics of heart failure: signs/symptoms and treatments.  Introduces Heart Failure Zone chart for action plan for heart failure. Written material provided at class time.   Infection Prevention: - Provides verbal and written material to individual with discussion of infection control including proper hand washing and proper equipment cleaning during exercise session.   Falls Prevention: - Provides verbal and written material to individual with discussion of falls prevention and safety.   Other: -Provides group and verbal instruction on various topics (see comments)   Knowledge Questionnaire Score:  Knowledge Questionnaire Score - 09/04/23 1345       Knowledge Questionnaire Score   Pre Score 25/26          Core Components/Risk Factors/Patient Goals at Admission:  Personal Goals and Risk Factors at Admission - 09/04/23 1303       Core Components/Risk Factors/Patient Goals on Admission   Improve shortness of breath with ADL's Yes    Intervention Provide  education, individualized exercise plan and daily activity instruction to help decrease symptoms of SOB with activities of daily living.    Expected Outcomes Short Term: Improve cardiorespiratory fitness to achieve a reduction of symptoms when performing ADLs;Long Term: Be able to perform more ADLs without symptoms or delay the onset of symptoms    Hypertension Yes    Intervention Provide education on lifestyle modifcations including regular physical activity/exercise, weight management, moderate sodium restriction and increased consumption of fresh fruit, vegetables, and low fat dairy, alcohol moderation, and smoking cessation.;Monitor prescription use compliance.    Expected Outcomes Short Term: Continued assessment and intervention until BP is < 140/6mm HG in hypertensive participants. < 130/83mm HG in hypertensive participants with diabetes, heart failure or chronic kidney disease.;Long Term: Maintenance of blood pressure at goal levels.    Lipids Yes    Intervention Provide education and support for participant on nutrition & aerobic/resistive exercise along with prescribed medications to achieve LDL 70mg , HDL >40mg .    Expected Outcomes Long Term: Cholesterol controlled with medications as prescribed, with individualized exercise RX and with personalized nutrition plan. Value goals: LDL < 70mg , HDL > 40 mg.;Short Term: Participant states  understanding of desired cholesterol values and is compliant with medications prescribed. Participant is following exercise prescription and nutrition guidelines.    Stress Yes    Intervention Offer individual and/or small group education and counseling on adjustment to heart disease, stress management and health-related lifestyle change. Teach and support self-help strategies.;Refer participants experiencing significant psychosocial distress to appropriate mental health specialists for further evaluation and treatment. When possible, include family members and  significant others in education/counseling sessions.    Expected Outcomes Short Term: Participant demonstrates changes in health-related behavior, relaxation and other stress management skills, ability to obtain effective social support, and compliance with psychotropic medications if prescribed.;Long Term: Emotional wellbeing is indicated by absence of clinically significant psychosocial distress or social isolation.          Education:Diabetes - Individual verbal and written instruction to review signs/symptoms of diabetes, desired ranges of glucose level fasting, after meals and with exercise. Acknowledge that pre and post exercise glucose checks will be done for 3 sessions at entry of program.   Core Components/Risk Factors/Patient Goals Review:    Core Components/Risk Factors/Patient Goals at Discharge (Final Review):    ITP Comments:  ITP Comments     Row Name 09/04/23 1320 09/09/23 0938 09/11/23 0829 10/09/23 0821 10/30/23 1015   ITP Comments Patient attend orientation today.  Patient is attending Cardiac Rehabilitation Program.  Documentation for diagnosis can be found in [facility].  Reviewed medical chart, RPE/RPD, gym safety, and program guidelines.  Patient was fitted to equipment they will be using during rehab.  Patient is scheduled to start exercise on 09/09/23.   Initial ITP created and sent for review and signature by Dr. Dorn Ross, Medical Director for Cardiac Rehabilitation Program. First full day of exercise!  Patient was oriented to gym and equipment including functions, settings, policies, and procedures.  Patient's individual exercise prescription and treatment plan were reviewed.  All starting workloads were established based on the results of the 6 minute walk test done at initial orientation visit.  The plan for exercise progression was also introduced and progression will be customized based on patient's performance and goals. 30 day review completed. ITP sent to Dr.  Dorn Ross, Medical Director of Cardiac Rehab. Continue with ITP unless changes are made by physician.    New to program 30 day review completed. ITP sent to Dr. Dorn Ross, Medical Director of Cardiac Rehab. Continue with ITP unless changes are made by physician. Called patient regarding attendance to CR. She said she had to go out of town to help her mother and plans to be back in [place] next week. She plans to come Monday 7/14.    Row Name 11/06/23 0831 12/04/23 1014         ITP Comments 30 day review completed. ITP sent to Dr. Dorn Ross, Medical Director of Cardiac Rehab. Continue with ITP unless changes are made by physician.  Pt has been out of town with her mom but has not returned yet.  Last attended on 10/21/23.  Unable to assess goals this round. Pt never returned our calls or respoded to letter.  We will discharge her at this time.         Comments: Discharge ITP. Lack of attendance.

## 2023-12-11 ENCOUNTER — Encounter (HOSPITAL_COMMUNITY)

## 2023-12-16 ENCOUNTER — Encounter (HOSPITAL_COMMUNITY)

## 2023-12-18 ENCOUNTER — Encounter (HOSPITAL_COMMUNITY)

## 2023-12-25 ENCOUNTER — Encounter (HOSPITAL_COMMUNITY)

## 2023-12-30 ENCOUNTER — Encounter (HOSPITAL_COMMUNITY)

## 2024-01-01 ENCOUNTER — Encounter (HOSPITAL_COMMUNITY)

## 2024-01-06 ENCOUNTER — Encounter (HOSPITAL_COMMUNITY)

## 2024-02-12 ENCOUNTER — Other Ambulatory Visit: Payer: Self-pay | Admitting: Family Medicine

## 2024-03-09 ENCOUNTER — Ambulatory Visit: Payer: Self-pay | Admitting: Nurse Practitioner

## 2024-03-09 ENCOUNTER — Emergency Department (HOSPITAL_COMMUNITY)

## 2024-03-09 ENCOUNTER — Ambulatory Visit: Admitting: Nurse Practitioner

## 2024-03-09 ENCOUNTER — Emergency Department (HOSPITAL_COMMUNITY)
Admission: EM | Admit: 2024-03-09 | Discharge: 2024-03-10 | Disposition: A | Attending: Emergency Medicine | Admitting: Emergency Medicine

## 2024-03-09 ENCOUNTER — Other Ambulatory Visit: Payer: Self-pay

## 2024-03-09 ENCOUNTER — Ambulatory Visit: Payer: Self-pay

## 2024-03-09 ENCOUNTER — Encounter: Payer: Self-pay | Admitting: Nurse Practitioner

## 2024-03-09 VITALS — BP 115/74 | HR 87 | Temp 97.6°F | Ht 67.0 in | Wt 174.6 lb

## 2024-03-09 DIAGNOSIS — I2510 Atherosclerotic heart disease of native coronary artery without angina pectoris: Secondary | ICD-10-CM | POA: Insufficient documentation

## 2024-03-09 DIAGNOSIS — R079 Chest pain, unspecified: Secondary | ICD-10-CM

## 2024-03-09 DIAGNOSIS — Z7982 Long term (current) use of aspirin: Secondary | ICD-10-CM | POA: Diagnosis not present

## 2024-03-09 DIAGNOSIS — R9439 Abnormal result of other cardiovascular function study: Secondary | ICD-10-CM

## 2024-03-09 DIAGNOSIS — R0609 Other forms of dyspnea: Secondary | ICD-10-CM | POA: Diagnosis not present

## 2024-03-09 DIAGNOSIS — Z79899 Other long term (current) drug therapy: Secondary | ICD-10-CM | POA: Diagnosis not present

## 2024-03-09 DIAGNOSIS — I10 Essential (primary) hypertension: Secondary | ICD-10-CM | POA: Diagnosis not present

## 2024-03-09 DIAGNOSIS — R110 Nausea: Secondary | ICD-10-CM | POA: Diagnosis not present

## 2024-03-09 LAB — CBC WITH DIFFERENTIAL/PLATELET
Abs Immature Granulocytes: 0.04 K/uL (ref 0.00–0.07)
Basophils Absolute: 0.1 K/uL (ref 0.0–0.1)
Basophils Relative: 1 %
Eosinophils Absolute: 0.2 K/uL (ref 0.0–0.5)
Eosinophils Relative: 2 %
HCT: 40.8 % (ref 36.0–46.0)
Hemoglobin: 13.2 g/dL (ref 12.0–15.0)
Immature Granulocytes: 0 %
Lymphocytes Relative: 26 %
Lymphs Abs: 2.3 K/uL (ref 0.7–4.0)
MCH: 29.3 pg (ref 26.0–34.0)
MCHC: 32.4 g/dL (ref 30.0–36.0)
MCV: 90.7 fL (ref 80.0–100.0)
Monocytes Absolute: 0.6 K/uL (ref 0.1–1.0)
Monocytes Relative: 7 %
Neutro Abs: 5.7 K/uL (ref 1.7–7.7)
Neutrophils Relative %: 64 %
Platelets: 279 K/uL (ref 150–400)
RBC: 4.5 MIL/uL (ref 3.87–5.11)
RDW: 13 % (ref 11.5–15.5)
WBC: 9 K/uL (ref 4.0–10.5)
nRBC: 0 % (ref 0.0–0.2)

## 2024-03-09 LAB — COMPREHENSIVE METABOLIC PANEL WITH GFR
ALT: 22 U/L (ref 0–44)
AST: 22 U/L (ref 15–41)
Albumin: 4 g/dL (ref 3.5–5.0)
Alkaline Phosphatase: 36 U/L — ABNORMAL LOW (ref 38–126)
Anion gap: 10 (ref 5–15)
BUN: 14 mg/dL (ref 8–23)
CO2: 27 mmol/L (ref 22–32)
Calcium: 10 mg/dL (ref 8.9–10.3)
Chloride: 101 mmol/L (ref 98–111)
Creatinine, Ser: 0.61 mg/dL (ref 0.44–1.00)
GFR, Estimated: >60 mL/min (ref >60)
Glucose, Bld: 94 mg/dL (ref 70–99)
Potassium: 4.2 mmol/L (ref 3.5–5.1)
Sodium: 138 mmol/L (ref 135–145)
Total Bilirubin: 0.9 mg/dL (ref 0.0–1.2)
Total Protein: 7.7 g/dL (ref 6.5–8.1)

## 2024-03-09 LAB — TROPONIN I (HIGH SENSITIVITY)
Troponin I (High Sensitivity): 3 ng/L (ref <18)
Troponin I (High Sensitivity): 2 ng/L (ref <18)

## 2024-03-09 LAB — CBG MONITORING, ED: Glucose-Capillary: 93 mg/dL (ref 70–99)

## 2024-03-09 LAB — MAGNESIUM: Magnesium: 2.2 mg/dL (ref 1.7–2.4)

## 2024-03-09 LAB — LIPASE, BLOOD: Lipase: 26 U/L (ref 11–51)

## 2024-03-09 MED ORDER — ONDANSETRON HCL 4 MG PO TABS
4.0000 mg | ORAL_TABLET | Freq: Once | ORAL | Status: AC
  Administered 2024-03-09: 4 mg via ORAL
  Filled 2024-03-09: qty 1

## 2024-03-09 NOTE — ED Provider Triage Note (Signed)
 Emergency Medicine Provider Triage Evaluation Note  Courtney White , a 68 y.o. female  was evaluated in triage.  Pt complains of left-sided chest pain that radiates into left arm, initially happened over the weekend, with onset being on 15 November.  EMS called, she declined transport at that time.  Still has exertional dyspnea, intermittent nausea.  History of hypertension as well as history of CAD.  Review of Systems  Positive: As above Negative:   Physical Exam  BP (!) 149/94 (BP Location: Right Arm)   Pulse 90   Temp 97.8 F (36.6 C)   Resp 19   Ht 5' 6 (1.676 m)   Wt 78.9 kg   LMP  (LMP Unknown)   SpO2 98%   BMI 28.08 kg/m  Gen:   Awake, no distress   Resp:  Normal effort  MSK:   Moves extremities without difficulty  Other:  Normal pulmonary as well as cardiac auscultation  Medical Decision Making  Medically screening exam initiated at 4:36 PM.  Appropriate orders placed.  Courtney White was informed that the remainder of the evaluation will be completed by another provider, this initial triage assessment does not replace that evaluation, and the importance of remaining in the ED until their evaluation is complete.  Initial order set placed for chest pain/CAD workup.   Courtney White, [place] 03/09/24 [phone]

## 2024-03-09 NOTE — Telephone Encounter (Signed)
 Appt made.

## 2024-03-09 NOTE — Telephone Encounter (Signed)
 FYI Only or Action Required?: FYI only for provider: appointment scheduled on 03/09/24.  Patient was last seen in primary care on 07/26/2023 by Jolinda Norene HERO, DO.  Called Nurse Triage reporting Chest Pain.  Symptoms began several years ago.  Interventions attempted: OTC medications: TUMS and Pepcid , Prescription medications: omeprazole, and Rest, hydration, or home remedies.  Symptoms are: stable.  Triage Disposition: See Physician Within 24 Hours  Patient/caregiver understands and will follow disposition?: Yes                            Copied from CRM #[id number]. Topic: Clinical - Red Word Triage >> Mar 09, 2024  9:01 AM Carrielelia G wrote: [facility] that prompted transfer to Nurse Triage: chest pain,  sob, and left arm pain.  Called EMS this past weekend.  Reason for Disposition  [1] Chest pain lasts > 5 minutes AND [2] occurred > 3 days ago (72 hours) AND [3] NO chest pain or cardiac symptoms now  Answer Assessment - Initial Assessment Questions 1. LOCATION: Where does it hurt?       Denies pain at this time 2. RADIATION: Does the pain go anywhere else? (e.g., into neck, jaw, arms, back)     Denies pain in neck, jaw and arms at this time  3. ONSET: When did the chest pain begin? (Minutes, hours or days)      Denies symptoms at this time, states symptoms have been occurring for over 2 years, states she called EMS on Friday evening for symptoms, states she has not experienced chest pain/SOB since Friday 4. PATTERN: Does the pain come and go, or has it been constant since it started?  Does it get worse with exertion?      Intermittent, chest pain and SOB upon exertion  5. DURATION: How long does it last (e.g., seconds, minutes, hours)     A few minutes, estimates 5-10 minutes, states SOB resolves upon rest  6. SEVERITY: How bad is the pain?  (e.g., Scale 1-10; mild, moderate, or severe)     Denies pain at this time 7. CARDIAC RISK  FACTORS: Do you have any history of heart problems or risk factors for heart disease? (e.g., angina, prior heart attack; diabetes, high blood pressure, high cholesterol, smoker, or strong family history of heart disease)     States she has seen a cardiologist and cardiologist determined symptoms were not related to cardiac issues, history of HTN 8. PULMONARY RISK FACTORS: Do you have any history of lung disease?  (e.g., blood clots in lung, asthma, emphysema, birth control pills)     Denies 9. CAUSE: What do you think is causing the chest pain?     Hernia, per patient  10. OTHER SYMPTOMS: Do you have any other symptoms? (e.g., dizziness, nausea, vomiting, sweating, fever, difficulty breathing, cough)     SOB upon exertion for over 2 years, nausea daily, chronic cough, denies vomiting, denies dizziness 11. PREGNANCY: Is there any chance you are pregnant? When was your last menstrual period?     N/A  Protocols used: Chest Pain-A-AH

## 2024-03-09 NOTE — Progress Notes (Signed)
 Subjective:  Patient ID: Courtney White, female    DOB: 08-Jul-1955, 68 y.o.   MRN: [id number]  Patient Care Team: Jolinda Norene HERO, DO as PCP - General (Family Medicine) Elmira Newman JINNY, MD as PCP - Cardiology (Cardiology) Elmira Newman JINNY, MD as Consulting Physician (Cardiology)   Chief Complaint:  Shortness of Breath (Symptoms for 2 years, sees cardiologist ) and Chest Pain   HPI: Courtney White is a 68 y.o. female presenting on 03/09/2024 for Shortness of Breath (Symptoms for 2 years, sees cardiologist ) and Chest Pain   Discussed the use of AI scribe software for clinical note transcription with the patient, who gave verbal consent to proceed.  History of Present Illness Courtney White is a 68 year old female with coronary artery disease who presents with nausea and chest pain.  She has been experiencing nausea almost daily for the past couple of weeks without significant changes in her diet, which typically includes cereal for breakfast and meals consisting of chicken and rice. She ate leftover ham the morning of the visit.  She describes episodes of chest pain, shortness of breath, and left arm tightness and numbness occurring for approximately two to three years. These episodes are often associated with exertion but can also occur at rest, as experienced on a recent Friday night when she was startled awake, felt clammy, and experienced severe chest pain and shortness of breath, prompting her to take nitroglycerin , and aspirin .  She has a history of coronary artery disease with a 40% blockage, as diagnosed by her cardiologist. Previous diagnostic workups include a normal EKG performed by EMS on the night of the recent episode and a normal CT scan from April 2025. She has had an abnormal stress test and experiences shortness of breath with exertion. Her cardiologist has indicated that the blockage is diffuse and not localized to a specific area.  She also has  a history of a small hiatal hernia, which she believes may contribute to some of her symptoms. However, she has not discussed this with her gastroenterologist.  During the recent episode, her husband administered aspirin  and nitroglycerin . Her blood pressure was recorded at 88/44 before EMS arrival, which later improved. She declines transport to ED when EMS showed up to her house.      Relevant past medical, surgical, family, and social history reviewed and updated as indicated.  Allergies and medications reviewed and updated. Data reviewed: Chart in Epic.   Past Medical History:  Diagnosis Date   COVID-19 12/02/2020   Hepatitis C 1992   s/p treatment.  she has cleared virus   HTN (hypertension)    Thyroid  disease    Vitiligo     Past Surgical History:  Procedure Laterality Date   CORONARY PRESSURE/FFR STUDY N/A 06/26/2022   Procedure: INTRAVASCULAR PRESSURE WIRE/FFR STUDY;  Surgeon: Elmira Newman JINNY, MD;  Location: [facility];  Service: Cardiovascular;  Laterality: N/A;   LEFT HEART CATH AND CORONARY ANGIOGRAPHY N/A 06/26/2022   Procedure: LEFT HEART CATH AND CORONARY ANGIOGRAPHY;  Surgeon: Elmira Newman JINNY, MD;  Location: [facility];  Service: Cardiovascular;  Laterality: N/A;   THYROIDECTOMY Right 12/16/2020   Procedure: HEMI THYROIDECTOMY;  Surgeon: Karis Clunes, MD;  Location: [facility];  Service: ENT;  Laterality: Right;   TONSILLECTOMY AND ADENOIDECTOMY     UTERINE FIBROID SURGERY      Social History   Socioeconomic History   Marital status: Significant Other  Spouse name: Not on file   Number of children: 0   Years of education: Not on file   Highest education level: Not on file  Occupational History   Not on file  Tobacco Use   Smoking status: Never   Smokeless tobacco: Never  Vaping Use   Vaping status: Never Used  Substance and Sexual Activity   Alcohol use: Yes    Comment: occ   Drug use: Not Currently   Sexual  activity: Yes    Birth control/protection: Post-menopausal  Other Topics Concern   Not on file  Social History Narrative   Patient is a retired arts development officer.  She is a widower and notes that she moved from the [place] area after being reunited with a high school sweetheart whom she is now engaged to.   She lives locally in [place].   No children.   Social Drivers of Corporate Investment Banker Strain: Low Risk  (07/26/2023)   Overall Financial Resource Strain (CARDIA)    Difficulty of Paying Living Expenses: Not very hard  Food Insecurity: No Food Insecurity (07/26/2023)   Hunger Vital Sign    Worried About Running Out of Food in the Last Year: Never true    Ran Out of Food in the Last Year: Never true  Transportation Needs: No Transportation Needs (07/26/2023)   PRAPARE - Administrator, Civil Service (Medical): No    Lack of Transportation (Non-Medical): No  Physical Activity: Insufficiently Active (07/26/2023)   Exercise Vital Sign    Days of Exercise per Week: 3 days    Minutes of Exercise per Session: 30 min  Stress: No Stress Concern Present (07/26/2023)   [employer] of Occupational Health - Occupational Stress Questionnaire    Feeling of Stress : Not at all  Social Connections: Socially Integrated (07/26/2023)   Social Connection and Isolation Panel    Frequency of Communication with Friends and Family: More than three times a week    Frequency of Social Gatherings with Friends and Family: Three times a week    Attends Religious Services: More than 4 times per year    Active Member of Clubs or Organizations: Yes    Attends Banker Meetings: More than 4 times per year    Marital Status: Living with partner  Intimate Partner Violence: Not At Risk (07/26/2023)   Humiliation, Afraid, Rape, and Kick questionnaire    Fear of Current or Ex-Partner: No    Emotionally Abused: No    Physically Abused: No    Sexually Abused: No    Outpatient  Encounter Medications as of 03/09/2024  Medication Sig   acetaminophen  (TYLENOL ) 325 MG tablet Take 650 mg by mouth every 6 (six) hours as needed for moderate pain.   amLODipine  (NORVASC ) 5 MG tablet Take 1 tablet (5 mg total) by mouth daily.   aspirin  EC (ASPIRIN  LOW DOSE) 81 MG tablet TAKE 1 TABLET (81 MG TOTAL) BY MOUTH DAILY. SWALLOW WHOLE.   Carboxymethylcellul-Glycerin (LUBRICATING EYE DROPS OP) Place 1 drop into both eyes daily as needed (dry eyes).   diclofenac Sodium (VOLTAREN) 1 % GEL Apply 1 Application topically 3 (three) times daily as needed (joint pain).   hydrochlorothiazide  (HYDRODIURIL ) 25 MG tablet Take 0.5 tablets (12.5 mg total) by mouth daily.   ibuprofen (ADVIL) 200 MG tablet Take 400 mg by mouth every 8 (eight) hours as needed (pain).   levothyroxine  (SYNTHROID ) 112 MCG tablet TAKE 1 TABLET BY MOUTH EVERY DAY  metoprolol  succinate (TOPROL -XL) 25 MG 24 hr tablet Take 1 tablet (25 mg total) by mouth daily. TAKE WITH OR IMMEDIATELY FOLLOWING A MEAL.   nitroGLYCERIN  (NITROSTAT ) 0.4 MG SL tablet PLACE 1 TABLET UNDER THE TONGUE EVERY 5 MINUTES AS NEEDED FOR CHEST PAIN.   rosuvastatin  (CRESTOR ) 10 MG tablet Take 1 tablet (10 mg total) by mouth daily.   tirzepatide  (ZEPBOUND ) 2.5 MG/0.5ML injection vial Inject 2.5 mg into the skin once a week.   Vitamin D , Ergocalciferol , (DRISDOL ) 1.25 MG (50000 UNIT) CAPS capsule TAKE 1 CAPSULE (50,000 UNITS TOTAL) BY MOUTH EVERY MONDAY.   No facility-administered encounter medications on file as of 03/09/2024.    No Known Allergies  Review of Systems  Constitutional:  Negative for chills and fever.  HENT:  Negative for congestion and sore throat.   Respiratory:  Positive for shortness of breath.   Cardiovascular:  Positive for chest pain.  Gastrointestinal:  Positive for nausea. Negative for abdominal pain.  Skin:  Negative for itching and rash.  Neurological:  Negative for dizziness, weakness and headaches.         Objective:   BP 115/74   Pulse 87   Temp 97.6 F (36.4 C) (Temporal)   Ht 5' 7 (1.702 m)   Wt 174 lb 9.6 oz (79.2 kg)   LMP  (LMP Unknown)   SpO2 96%   BMI 27.35 kg/m    Wt Readings from Last 3 Encounters:  03/09/24 174 lb 9.6 oz (79.2 kg)  09/17/23 176 lb 6.4 oz (80 kg)  09/04/23 175 lb 14.8 oz (79.8 kg)    Physical Exam Vitals and nursing note reviewed.  Constitutional:      Appearance: She is overweight.  HENT:     Head: Normocephalic and atraumatic.     Right Ear: Tympanic membrane, ear canal and external ear normal. There is no impacted cerumen.     Left Ear: Tympanic membrane, ear canal and external ear normal. There is no impacted cerumen.     Nose: Nose normal.     Mouth/Throat:     Mouth: Mucous membranes are moist.  Eyes:     General: No scleral icterus.    Extraocular Movements: Extraocular movements intact.     Conjunctiva/sclera: Conjunctivae normal.     Pupils: Pupils are equal, round, and reactive to light.  Neck:     Thyroid : No thyromegaly.     Vascular: No carotid bruit or JVD.     Trachea: No tracheal deviation.  Pulmonary:     Effort: Pulmonary effort is normal.     Breath sounds: Normal breath sounds.  Abdominal:     General: Bowel sounds are normal.     Palpations: Abdomen is soft.  Musculoskeletal:        General: Normal range of motion.     Cervical back: Normal range of motion and neck supple. No rigidity or tenderness.     Right lower leg: No edema.     Left lower leg: No edema.  Lymphadenopathy:     Cervical: No cervical adenopathy.  Skin:    General: Skin is warm and dry.     Findings: No rash.  Neurological:     Mental Status: She is alert and oriented to person, place, and time.  Psychiatric:        Mood and Affect: Mood normal.        Behavior: Behavior normal.        Thought Content: Thought content normal.  Judgment: Judgment normal.    Physical Exam    EKG 03/09/2024 Rate: 86 BPM PR: 146 msec QT: 372 msec QTcH 417  msec msec QRSD: 84 msec P-QRS-T: 37/28/22 degree Interpretation:Sinus Rhythm   Results for orders placed or performed during the hospital encounter of 08/14/23  NM PET CT CARDIAC PERFUSION MULTI W/ABSOLUTE BLOODFLOW   Collection Time: 08/14/23  1:04 PM  Result Value Ref Range   Rest Nuclear Isotope Dose 20.9 mCi   Stress Nuclear Isotope Dose 20.9 mCi   Rest HR 81.0 bpm   Rest BP 134/81 mmHg   Peak HR 104 bpm   Peak BP 134/81 mmHg   ST Depression (mm) 0 mm   Stress perfusion cavity size (mL) 61.0 mL   Rest perfusion cavity size (mL) 56.0 mL   TID 1.11    LV dias vol 56.0 46 - 106 mL   Nuc Stress EF 72 %   Nuc Rest EF 71 %   Rest MBF 1.19 ml/g/min   Stress MBF 2.66 ml/g/min   MBFR 2.24        Pertinent labs & imaging results that were available during my care of the patient were reviewed by me and considered in my medical decision making.  Assessment & Plan:  Courtney White was seen today for shortness of breath and chest pain.  Diagnoses and all orders for this visit:  Chest pain, unspecified type -     EKG 12-Lead  Coronary artery disease involving native coronary artery of native heart without angina pectoris  Abnormal stress test  Exertional dyspnea     Assessment and Plan Courtney White is a 68 year old Caucasian female seen today for chest pain, no acute distress Assessment & Plan Atherosclerotic heart disease of native coronary artery with exertional dyspnea, chest pain, and abnormal cardiovascular stress test Chronic coronary artery disease with 40% blockage. Recent chest pain and dyspnea at rest, relieved by nitroglycerin . Abnormal stress test. Cardiologist advised no intervention due to blockage not reaching 70% and insurance constraints. Discussed dietary modifications and moderation in physical activity. Emphasized need for immediate ED evaluation if nitroglycerin  is required. - Performed EKG to assess for changes. - Advised immediate ED evaluation if nitroglycerin  is  required for chest pain. - Discussed dietary modifications to reduce saturated fat intake. - Advised moderation in physical activity.  Nausea Persistent nausea possibly linked to hiatal hernia or cardiac issues. No significant dietary changes reported. - Advised follow-up with cardiologist to discuss hiatal hernia. - Recommended small, frequent meals. - Advised ED evaluation to rule out cardiac causes.      Continue all other maintenance medications.  Follow up plan: No follow-ups on file.   Continue healthy lifestyle choices, including diet (rich in fruits, vegetables, and lean proteins, and low in salt and simple carbohydrates) and exercise (at least 30 minutes of moderate physical activity daily).  Educational handout given for    Clinical References  Nonspecific Chest Pain Chest pain can be caused by many different conditions. Some causes of chest pain can be life-threatening. These will require treatment right away. Serious causes of chest pain include: Heart attack. A tear in the body's main blood vessel. Redness and swelling (inflammation) around your heart. Blood clot in your lungs. Other causes of chest pain may not be so serious. These include: Heartburn. Anxiety or stress. Damage to bones or muscles in your chest. Lung infections. Chest pain can feel like: Pain or discomfort in your chest. Crushing, pressure, aching, or squeezing pain. Burning or tingling. Dull  or sharp pain that is worse when you move, cough, or take a deep breath. Pain or discomfort that is also felt in your back, neck, jaw, shoulder, or arm, or pain that spreads to any of these areas. It is hard to know whether your pain is caused by something that is serious or something that is not so serious. So it is important to see your doctor right away if you have chest pain. Follow these instructions at home: Medicines Take over-the-counter and prescription medicines only as told by your doctor. If  you were prescribed an antibiotic medicine, take it as told by your doctor. Do not stop taking the antibiotic even if you start to feel better. Lifestyle  Rest as told by your doctor. Do not use any products that contain nicotine or tobacco, such as cigarettes, e-cigarettes, and chewing tobacco. If you need help quitting, ask your doctor. Do not drink alcohol. Make lifestyle changes as told by your doctor. These may include: Getting regular exercise. Ask your doctor what activities are safe for you. Eating a heart-healthy diet. A diet and nutrition specialist (dietitian) can help you to learn healthy eating options. Staying at a healthy weight. Treating diabetes or high blood pressure, if needed. Lowering your stress. Activities such as yoga and relaxation techniques can help. General instructions Pay attention to any changes in your symptoms. Tell your doctor about them or any new symptoms. Avoid any activities that cause chest pain. Keep all follow-up visits as told by your doctor. This is important. You may need more testing if your chest pain does not go away. Contact a doctor if: Your chest pain does not go away. You feel depressed. You have a fever. Get help right away if: Your chest pain is worse. You have a cough that gets worse, or you cough up blood. You have very bad (severe) pain in your belly (abdomen). You pass out (faint). You have either of these for no clear reason: Sudden chest discomfort. Sudden discomfort in your arms, back, neck, or jaw. You have shortness of breath at any time. You suddenly start to sweat, or your skin gets clammy. You feel sick to your stomach (nauseous). You throw up (vomit). You suddenly feel lightheaded or dizzy. You feel very weak or tired. Your heart starts to beat fast, or it feels like it is skipping beats. These symptoms may be an emergency. Do not wait to see if the symptoms will go away. Get medical help right away. Call your local  emergency services (911 in the [place] Do not drive yourself to the hospital. Summary Chest pain can be caused by many different conditions. The cause may be serious and need treatment right away. If you have chest pain, see your doctor right away. Follow your doctor's instructions for taking medicines and making lifestyle changes. Keep all follow-up visits as told by your doctor. This includes visits for any further testing if your chest pain does not go away. Be sure to know the signs that show that your condition has become worse. Get help right away if you have these symptoms. This information is not intended to replace advice given to you by your health care provider. Make sure you discuss any questions you have with your health care provider. Document Revised: 02/22/2022 Document Reviewed: 02/22/2022 Elsevier Patient Education  2024 Elsevier Inc. Chest Wall Pain Chest wall pain is pain in or around the bones and muscles of your chest. Sometimes, an injury causes this pain. Excessive coughing  or overuse of arm and chest muscles may also cause chest wall pain. Sometimes, the cause may not be known. This pain may take several weeks or longer to get better. Follow these instructions at home: Managing pain, stiffness, and swelling  If directed, put ice on the painful area: Put ice in a plastic bag. Place a towel between your skin and the bag. Leave the ice on for 20 minutes, 2-3 times per day. Activity Rest as told by your health care provider. Avoid activities that cause pain. These include any activities that use your chest muscles or your abdominal and side muscles to lift heavy items. Ask your health care provider what activities are safe for you. General instructions  Take over-the-counter and prescription medicines only as told by your health care provider. Do not use any products that contain nicotine or tobacco, such as cigarettes, e-cigarettes, and chewing tobacco. These can delay  healing after injury. If you need help quitting, ask your health care provider. Keep all follow-up visits as told by your health care provider. This is important. Contact a health care provider if: You have a fever. Your chest pain becomes worse. You have new symptoms. Get help right away if: You have nausea or vomiting. You feel sweaty or light-headed. You have a cough with mucus from your lungs (sputum) or you cough up blood. You develop shortness of breath. These symptoms may represent a serious problem that is an emergency. Do not wait to see if the symptoms will go away. Get medical help right away. Call your local emergency services (911 in the [place] Do not drive yourself to the hospital. Summary Chest wall pain is pain in or around the bones and muscles of your chest. Depending on the cause, it may be treated with ice, rest, medicines, and avoiding activities that cause pain. Contact a health care provider if you have a fever, worsening chest pain, or new symptoms. Get help right away if you feel light-headed or you develop shortness of breath. These symptoms may be an emergency. This information is not intended to replace advice given to you by your health care provider. Make sure you discuss any questions you have with your health care provider. Document Revised: 04/02/2022 Document Reviewed: 04/02/2022 Elsevier Patient Education  2024 Elsevier Inc. Carotid Artery Disease  The carotid arteries are the two main blood vessels on either side of the neck. They send blood to the brain, other parts of the head, and the neck. Carotid artery disease happens when one or both of the carotid arteries get narrow or blocked. This condition is also called carotid artery stenosis. This condition increases your risk for a stroke or a transient ischemic attack (TIA). A TIA is a mini-stroke that causes stroke-like symptoms that go away quickly. What are the causes? This condition is mainly  caused by a narrowing and hardening of the carotid arteries. The carotid arteries can become narrow or clogged with a buildup of plaque. Plaque includes: Fat. Cholesterol. Calcium . Other substances. What increases the risk? Having certain medical conditions, such as: High cholesterol. High blood pressure. Diabetes. Being very overweight (obese). Smoking. A family history of cardiovascular disease. Not being active or not exercising. Being female. People who are female have a higher risk of having arteries become narrow and harden earlier in life than people who are female. Being female and older than 69 years old. Being female and older than 69 years old. What are the signs or symptoms? This condition may not  have any signs or symptoms until a stroke or TIA happens. In some cases, your doctor may be able to hear a whooshing sound with a stethoscope. This can mean a change in blood flow caused by plaque buildup. How is this treated? This condition may be treated with more than one treatment. Treatment may include: Lifestyle changes, such as: Quitting smoking. Getting regular exercise as told by your doctor. Eating a healthy diet. Managing stress. Getting to and staying at a healthy weight. Medicines to control: Blood pressure. Cholesterol. Blood clotting. Surgery. You may have: A surgery to remove the blockages in the carotid arteries. A procedure in which a small mesh tube (stent) is used to widen the blocked carotid arteries. Follow these instructions at home: Eating and drinking Follow instructions from your doctor about what you may eat and drink. It is important to: Eat a healthy diet that includes: A lot of fresh fruits and vegetables. Low-fat (lean) meats. Avoid these foods: Foods that are high in fat. Foods that are high in salt (sodium). Foods that are fried. Foods that are processed. Foods that have few good nutrients (poor nutritional value).   Lifestyle  Keep a  healthy weight. Do exercises as told by your doctor to stay active. Each week, you should get one of the following: At least 150 minutes of exercise that raises your heart rate and makes you sweat. At least 75 minutes of exercise that takes a lot of effort. Do not smoke or use any products that contain nicotine or tobacco. If you need help quitting, ask your doctor. Do not drink alcohol if: Your doctor tells you not to drink. You are pregnant, may be pregnant, or are planning to become pregnant. If you drink alcohol: Limit how much you have to: 0-1 drink a day for women. 0-2 drinks a day for men. Know how much alcohol is in your drink. In the [place] one drink equals one 12 oz bottle of beer (355 mL), one 5 oz glass of wine (148 mL), or one 1 oz glass of hard liquor (44 mL). Do not use drugs. Manage your stress. Ask your doctor for tips on how to do this. General instructions Take over-the-counter and prescription medicines only as told by your doctor. Keep all follow-up visits. Your doctor will watch your condition and may need to change your treatment plan over time. Where to find more information American Heart Association: heart.org Get help right away if: You have any signs of a stroke. BE FAST is an easy way to remember the main warning signs: B - Balance. Signs are dizziness, sudden trouble walking, or loss of balance. E - Eyes. Signs are trouble seeing or a change in how you see. F - Face. Signs are sudden weakness or loss of feeling of the face, or the face or eyelid drooping on one side. A - Arms. Signs are weakness or loss of feeling in an arm. This happens suddenly and usually on one side of the body. S - Speech. Signs are sudden trouble speaking, slurred speech, or trouble understanding what people say. T - Time. Time to call emergency services. Write down what time symptoms started. You have other signs of a stroke, such as: A sudden, very bad headache with no known  cause. Feeling like you may vomit (nausea). Vomiting. A seizure. These symptoms may be an emergency. Get help right away. Call 911. Do not wait to see if the symptoms will go away. Do not drive yourself to  the hospital. This information is not intended to replace advice given to you by your health care provider. Make sure you discuss any questions you have with your health care provider. Document Revised: 09/05/2021 Document Reviewed: 09/05/2021 Elsevier Patient Education  2024 Elsevier Inc. Shortness of Breath, Adult Shortness of breath means you have trouble breathing. Shortness of breath could be a sign of a medical problem. Follow these instructions at home:  Pollution Do not smoke or use any products that contain nicotine or tobacco. If you need help quitting, ask your doctor. Avoid things that can make it harder to breathe, such as: Smoke of all kinds. This includes smoke from campfires or forest fires. Do not smoke or allow others to smoke in your home. Mold. Dust. Air pollution. Chemical smells. Things that can give you an allergic reaction (allergens) if you have allergies. Keep your living space clean. Use products that help remove mold and dust. General instructions Watch for any changes in your symptoms. Take over-the-counter and prescription medicines only as told by your doctor. This includes oxygen therapy and inhaled medicines. Rest as needed. Return to your normal activities when your doctor says that it is safe. Keep all follow-up visits. Contact a doctor if: Your condition does not get better as soon as expected. You have a hard time doing your normal activities, even after you rest. You have new symptoms. You cannot walk up stairs. You cannot exercise the way you normally do. Get help right away if: Your shortness of breath gets worse. You have trouble breathing when you are resting. You feel light-headed or you faint. You have a cough that is not helped  by medicines. You cough up blood. You have pain with breathing. You have pain in your chest, arms, shoulders, or belly (abdomen). You have a fever. These symptoms may be an emergency. Get help right away. Call 911. Do not wait to see if the symptoms will go away. Do not drive yourself to the hospital. Summary Shortness of breath is when you have trouble breathing enough air. It can be a sign of a medical problem. Avoid things that make it hard for you to breathe, such as smoking, pollution, mold, and dust. Watch for any changes in your symptoms. Contact your doctor if you do not get better or you get worse. This information is not intended to replace advice given to you by your health care provider. Make sure you discuss any questions you have with your health care provider. Document Revised: 11/26/2020 Document Reviewed: 11/26/2020 Elsevier Patient Education  2024 Elsevier Inc.  The above assessment and management plan was discussed with the patient. The patient verbalized understanding of and has agreed to the management plan. Patient is aware to call the clinic if they develop any new symptoms or if symptoms persist or worsen. Patient is aware when to return to the clinic for a follow-up visit. Patient educated on when it is appropriate to go to the emergency department.   Zarianna Dicarlo [place] Thompson, DNP [facility] [address] [place], [place] [address] [phone]

## 2024-03-09 NOTE — ED Triage Notes (Signed)
 PCP recommended patient come in to ED to be evaluated after having chest pain Friday night with a achey left arm. Today she is not having any chest pain but is having nausea. She is also reports exertional shob.  Denies any pain.

## 2024-03-10 ENCOUNTER — Emergency Department (HOSPITAL_COMMUNITY)

## 2024-03-10 MED ORDER — IOHEXOL 350 MG/ML SOLN
100.0000 mL | Freq: Once | INTRAVENOUS | Status: AC | PRN
  Administered 2024-03-10: 100 mL via INTRAVENOUS

## 2024-03-10 MED ORDER — ONDANSETRON HCL 4 MG/2ML IJ SOLN
4.0000 mg | Freq: Once | INTRAMUSCULAR | Status: AC
  Administered 2024-03-10: 4 mg via INTRAVENOUS
  Filled 2024-03-10: qty 2

## 2024-03-10 NOTE — Discharge Instructions (Addendum)
 Your workup today was reassuring.  Please continue to follow with your primary care provider and cardiologist for further management of your chest pain as needed.  As discussed there were pulmonary nodules noted on imaging with recommendations for follow-up imaging in 6 to 12 months.  Please discuss with your primary care provider so that repeat imaging can be arranged.  Return to the emergency room if you develop any life-threatening symptoms.

## 2024-03-10 NOTE — ED Provider Notes (Signed)
 [facility] EMERGENCY DEPARTMENT AT [facility] Provider Note   CSN: [id number] Arrival date & time: 03/09/24  1559     Patient presents with: No chief complaint on file.   Courtney White is a 68 y.o. female.  Patient with past medical history significant for dyspnea on exertion, CAD with stable angina, hypertension presents to the emergency department complaining of nausea been ongoing for 2 weeks and episodes of chest pain which have been ongoing for 2 to 3 years.  She states she has had extensive workup by cardiology with no definitive diagnosis.  She states that Friday she had an episode of chest pain described as pressure on the left side of the chest.  This was relieved by nitroglycerin .  EMS was called.  She initially had a low blood pressure which improved rapidly.  She believes the blood pressure was measured after taking the nitroglycerin .  She had no further chest pain over the weekend.  She called her primary care today who recommended she come to the emergency department for evaluation.  Currently her  only system is some nausea which has been ongoing for 2 weeks with no abdominal pain or emesis.   HPI     Prior to Admission medications   Medication Sig Start Date End Date Taking? Authorizing Provider  acetaminophen  (TYLENOL ) 325 MG tablet Take 650 mg by mouth every 6 (six) hours as needed for moderate pain.    [provider]  amLODipine  (NORVASC ) 5 MG tablet Take 1 tablet (5 mg total) by mouth daily. 07/26/23   Jolinda Norene HERO, DO  aspirin  EC (ASPIRIN  LOW DOSE) 81 MG tablet TAKE 1 TABLET (81 MG TOTAL) BY MOUTH DAILY. SWALLOW WHOLE. 07/09/23   Patwardhan, Newman PARAS, MD  Carboxymethylcellul-Glycerin (LUBRICATING EYE DROPS OP) Place 1 drop into both eyes daily as needed (dry eyes).    [provider]  diclofenac Sodium (VOLTAREN) 1 % GEL Apply 1 Application topically 3 (three) times daily as needed (joint pain).    [provider]   hydrochlorothiazide  (HYDRODIURIL ) 25 MG tablet Take 0.5 tablets (12.5 mg total) by mouth daily. 07/25/23   Patwardhan, Newman PARAS, MD  ibuprofen (ADVIL) 200 MG tablet Take 400 mg by mouth every 8 (eight) hours as needed (pain).    [provider]  levothyroxine  (SYNTHROID ) 112 MCG tablet TAKE 1 TABLET BY MOUTH EVERY DAY 10/21/23   Jolinda Norene M, DO  metoprolol  succinate (TOPROL -XL) 25 MG 24 hr tablet Take 1 tablet (25 mg total) by mouth daily. TAKE WITH OR IMMEDIATELY FOLLOWING A MEAL. 07/26/23   Jolinda Norene M, DO  nitroGLYCERIN  (NITROSTAT ) 0.4 MG SL tablet PLACE 1 TABLET UNDER THE TONGUE EVERY 5 MINUTES AS NEEDED FOR CHEST PAIN. 11/25/23   Jolinda Norene M, DO  rosuvastatin  (CRESTOR ) 10 MG tablet Take 1 tablet (10 mg total) by mouth daily. 07/26/23   Jolinda Norene HERO, DO  tirzepatide  (ZEPBOUND ) 2.5 MG/0.5ML injection vial Inject 2.5 mg into the skin once a week. 09/27/23   Patwardhan, Manish J, MD  Vitamin D , Ergocalciferol , (DRISDOL ) 1.25 MG (50000 UNIT) CAPS capsule TAKE 1 CAPSULE (50,000 UNITS TOTAL) BY MOUTH EVERY MONDAY. 02/17/24   Jolinda Norene HERO, DO    Allergies: Patient has no known allergies.    Review of Systems  Updated Vital Signs BP 134/76 (BP Location: Right Arm)   Pulse 79   Temp 97.8 F (36.6 C) (Oral)   Resp 16   Ht 5' 6 (1.676 m)   Wt  78.9 kg   LMP  (LMP Unknown)   SpO2 95%   BMI 28.08 kg/m   Physical Exam Vitals and nursing note reviewed.  Constitutional:      General: She is not in acute distress.    Appearance: She is well-developed.  HENT:     Head: Normocephalic and atraumatic.  Eyes:     Conjunctiva/sclera: Conjunctivae normal.  Cardiovascular:     Rate and Rhythm: Normal rate and regular rhythm.  Pulmonary:     Effort: Pulmonary effort is normal. No respiratory distress.     Breath sounds: Normal breath sounds.  Abdominal:     Palpations: Abdomen is soft.     Tenderness: There is no abdominal tenderness.  Musculoskeletal:         General: No swelling.     Cervical back: Neck supple.  Skin:    General: Skin is warm and dry.     Capillary Refill: Capillary refill takes less than 2 seconds.  Neurological:     Mental Status: She is alert.  Psychiatric:        Mood and Affect: Mood normal.     (all labs ordered are listed, but only abnormal results are displayed) Labs Reviewed  COMPREHENSIVE METABOLIC PANEL WITH GFR - Abnormal; Notable for the following components:      Result Value   Alkaline Phosphatase 36 (*)    All other components within normal limits  MAGNESIUM  LIPASE, BLOOD  CBC WITH DIFFERENTIAL/PLATELET  CBG MONITORING, ED  TROPONIN I (HIGH SENSITIVITY)  TROPONIN I (HIGH SENSITIVITY)    EKG: None  Radiology: CT Angio Chest/Abd/Pel for Dissection W and/or Wo Contrast Result Date: 03/10/2024 EXAM: CTA CHEST, ABDOMEN AND PELVIS WITH AND WITHOUT CONTRAST 03/10/2024 03:20:53 AM TECHNIQUE: CTA of the chest was performed with and without the administration of intravenous contrast. CTA of the abdomen and pelvis was performed with and without the administration of intravenous contrast. Multiplanar reformatted images are provided for review. MIP images are provided for review. Automated exposure control, iterative reconstruction, and/or weight based adjustment of the mA/kV was utilized to reduce the radiation dose to as low as reasonably achievable. 100 mL of iohexol  (OMNIPAQUE ) 350 MG/ML injection was administered. COMPARISON: Prior examination of 02/15/2022. CLINICAL HISTORY: Acute aortic syndrome (AAS) suspected. FINDINGS: VASCULATURE: AORTA: Mild atherosclerotic calcification within the thoracic aorta. Moderate aortoiliac atherosclerotic calcification. No aortic aneurysm. No dissection. PULMONARY ARTERIES: Central pulmonary arteries are of normal caliber. No pulmonary embolism with the limits of this exam. GREAT VESSELS OF AORTIC ARCH: No acute finding. No dissection. No arterial occlusion or significant  stenosis. CELIAC TRUNK: No acute finding. No occlusion or significant stenosis. SUPERIOR MESENTERIC ARTERY: No acute finding. No occlusion or significant stenosis. INFERIOR MESENTERIC ARTERY: No acute finding. No occlusion or significant stenosis. RENAL ARTERIES: No acute finding. No occlusion or significant stenosis. ILIAC ARTERIES: Moderate aortoiliac atherosclerotic calcification. No occlusion or significant stenosis. CHEST: MEDIASTINUM: Extensive left anterior descending coronary artery calcification. Global cardiac size within normal limits. No pericardial effusion. No mediastinal lymphadenopathy. LUNGS AND PLEURA: Mean 6 mm subsolid pulmonary nodule noted within the right lower lobe image 84, series 6, new. 5 mm noncalcified pulmonary nodule right lower lobe, image 74, series 6, new. 3 mm nodule within the right lower lobe, image 89, series 6, is stable, safely considered benign when compared to prior examination of 02/15/2022. Similarly multiple pulmonary nodules measuring up to 4 mm within the left lower lobe at images 96-97, series 6 are stable and safely considered  benign. No pneumothorax or pleural effusion. THORACIC BONES AND SOFT TISSUES: Status post right thyroidectomy. No acute bone or soft tissue abnormality. ABDOMEN AND PELVIS: LIVER: The liver is unremarkable. GALLBLADDER AND BILE DUCTS: Cholelithiasis without superimposed pericholecystic inflammatory change. No intra or extrahepatic biliary ductal dilation. SPLEEN: The spleen is unremarkable. PANCREAS: The pancreas is unremarkable. ADRENAL GLANDS: Bilateral adrenal glands demonstrate no acute abnormality. KIDNEYS, URETERS AND BLADDER: No stones in the kidneys or ureters. No hydronephrosis. No perinephric or periureteral stranding. Urinary bladder is unremarkable. GI AND BOWEL: Appendix is unremarkable. The stomach, small bowel, and large bowel are otherwise unremarkable. There is no bowel obstruction. No abnormal bowel wall thickening or  distension. REPRODUCTIVE: Reproductive organs are unremarkable. PERITONEUM AND RETROPERITONEUM: No ascites or free air. LYMPH NODES: No lymphadenopathy. ABDOMINAL BONES AND SOFT TISSUES: No acute abnormality of the bones. No acute soft tissue abnormality. IMPRESSION: 1. No evidence of acute aortic syndrome 2. New 6 mm subsolid pulmonary nodule in the right lower lobe; recommend chest CT at 612 months to confirm persistence, then, if persistent, CT every 2 years until 5 years or earlier if growth/solid component develops, per Fleischner Society Guidelines 3. New 5 mm solid pulmonary nodule in the right lower lobe; follow-up imaging will be dictated by #2 4. Multiple additional pulmonary nodules 4 mm bilaterally are stable compared to 02/15/2022 and can be considered benign; no follow-up recommended for the specific lesions 5. Cholelithiasis without superimposed pericholecystic inflammatory change Electronically signed by: Dorethia Molt MD 03/10/2024 03:51 AM EST RP Workstation: [id number]   DG Chest 1 View Result Date: 03/09/2024 CLINICAL DATA:  Chest pain EXAM: CHEST  1 VIEW COMPARISON:  Chest x-ray 08/21/2023 FINDINGS: The heart size and mediastinal contours are within normal limits. Both lungs are clear. The visualized skeletal structures are unremarkable. IMPRESSION: No active disease. Electronically Signed   By: Greig Pique M.D.   On: 03/09/2024 17:57     Procedures   Medications Ordered in the ED  ondansetron  (ZOFRAN ) tablet 4 mg (4 mg Oral Given 03/09/24 1803)  ondansetron  (ZOFRAN ) injection 4 mg (4 mg Intravenous Given 03/10/24 0248)  iohexol  (OMNIPAQUE ) 350 MG/ML injection 100 mL (100 mLs Intravenous Contrast Given 03/10/24 0322)                                    Medical Decision Making Amount and/or Complexity of Data Reviewed Radiology: ordered.  Risk Prescription drug management.   This patient presents to the ED for concern of chest pain, this involves an extensive number  of treatment options, and is a complaint that carries with it a high risk of complications and morbidity.  The differential diagnosis includes ACS, pneumonia, PE, dissection, musculoskeletal pain, others   Co morbidities / Chronic conditions that complicate the patient evaluation  As noted in HPI   Additional history obtained:  Additional history obtained from EMR External records from outside source obtained and reviewed including primary care and cardiology notes   Lab Tests:  I Ordered, and personally interpreted labs.  The pertinent results include: Negative troponins x 2, lipase 26   Imaging Studies ordered:  I ordered imaging studies including chest x-ray, CT angio chest/abd/pel dissection study  I independently visualized and interpreted imaging which showed  1. No evidence of acute aortic syndrome  2. New 6 mm subsolid pulmonary nodule in the right lower lobe; recommend chest  CT at 612 months to confirm persistence, then,  if persistent, CT every 2 years  until 5 years or earlier if growth/solid component develops, per Fleischner  Society Guidelines  3. New 5 mm solid pulmonary nodule in the right lower lobe; follow-up imaging  will be dictated by #2  4. Multiple additional pulmonary nodules 4 mm bilaterally are stable compared  to 02/15/2022 and can be considered benign; no follow-up recommended for the  specific lesions  5. Cholelithiasis without superimposed pericholecystic inflammatory change   I agree with the radiologist interpretation   Cardiac Monitoring: / EKG:  The patient was maintained on a cardiac monitor.  I personally viewed and interpreted the cardiac monitored which showed an underlying rhythm of: normal sinus rhythm   Problem List / ED Course / Critical interventions / Medication management   I ordered medication including zofran    Reevaluation of the patient after these medicines showed that the patient improved I have reviewed the  patients home medicines and have made adjustments as needed   Test / Admission - Considered:  Patient with nonischemic EKG and negative troponins x 2.  Not consistent with ACS.  Dissection study was negative for dissection or other significant acute abnormalities other than pulmonary nodules.  I discussed these with the patient with recommendations for follow-up imaging. Patient will continue to follow up with her PCP and cardiologist for further management as needed. No indication at this time for further emergent workup or admission.       Final diagnoses:  Chest pain, unspecified type    ED Discharge Orders     None          Logan Ubaldo KATHEE DEVONNA 03/10/24 [id number]    Palumbo, April, MD 03/10/24 [phone]

## 2024-03-10 NOTE — ED Notes (Signed)
 Pt refused repeat vitals and stated they are not needed

## 2024-03-11 ENCOUNTER — Telehealth: Payer: Self-pay | Admitting: Family Medicine

## 2024-03-11 DIAGNOSIS — R110 Nausea: Secondary | ICD-10-CM

## 2024-03-11 NOTE — Telephone Encounter (Signed)
 Copied from CRM #[id number]. Topic: Referral - Request for Referral >> Mar 10, 2024  4:37 PM Chiquita SQUIBB wrote: Did the patient discuss referral with their provider in the last year? Yes (If No - schedule appointment) (If Yes - send message)  Appointment offered? No  Type of order/referral and detailed reason for visit: Gastrology doctor, Patient was told in the ER that a previous hernia was showed and in the new CT scan it was gone, patient would like to discuss this more.   Preference of office, provider, location: No preference   If referral order, have you been seen by this specialty before? Yes a few years ago but unsure of the doctor.  (If Yes, this issue or another issue? When? Where?  Can we respond through MyChart? Yes

## 2024-03-11 NOTE — Telephone Encounter (Signed)
 Copied from CRM [id number]. Topic: General - Other >> Mar 10, 2024  4:39 PM Courtney White wrote: Reason for CRM: Patient is calling in to let the provider know she did go to the hospital after her appointment yesterday and did some testing. Patient is still unsure what is causing her to have these episodes and is wondering what she should do as the ER didn't see anything abnormal.

## 2024-03-11 NOTE — Telephone Encounter (Signed)
 I did not see the patient for this but after reviewing her chart, I certainly recommend that she consider calling her cardiologist for ongoing follow-up.  Of course we could place referral to pulmonology if cardiology determines that she does not have a cardiac etiology of her shortness of breath.

## 2024-03-11 NOTE — Telephone Encounter (Signed)
 Spoke to patient. She has had extensive cardiac work up and that a nodule was present in her lung.  Her main concern is she has been nauseous X 2 weeks and is concerned She would like a referral to GI to find out what might be going on and to check if she has a hernia.

## 2024-03-13 MED ORDER — ONDANSETRON 4 MG PO TBDP: 4.0000 mg | ORAL_TABLET | Freq: Three times a day (TID) | ORAL | 1 refills | Status: AC | PRN

## 2024-03-13 NOTE — Telephone Encounter (Signed)
 Patient called asking if Dr. Jolinda had an update about the referral they requested for GI. I informed patient of Dr. Jolinda recommendations and informed patient that referral has been placed. I advised patient that it can take about 14 business days before she hears about the referral. Patient is asking if medication for nausea can be sent in to help her until she is able to get in with gastroenterology. Patient would like medication sent to [facility] in [place] if possible.

## 2024-03-13 NOTE — Telephone Encounter (Signed)
 Pt went to the ER and then talked to the CMA but has not heard anything back form Dr. Jolinda.  She is still having nausea.  Nothing is helping.     Please see note below on 11/19

## 2024-03-13 NOTE — Telephone Encounter (Signed)
 Referral placed to GI.  Though I am not overly optimistic they will get her in fast.  If she cannot hold food/ drink down, definitely back to ER. Is she on Zepbound ?  Maybe she has a bowel obstruction?  This may need to be looked at.  Can she come back in for a check with DOD???

## 2024-03-13 NOTE — Addendum Note (Signed)
 Addended by: JOLINDA NORENE HERO on: 03/13/2024 02:21 PM   Modules accepted: Orders

## 2024-03-13 NOTE — Addendum Note (Signed)
 Addended by: JOLINDA NORENE HERO on: 03/13/2024 10:08 AM   Modules accepted: Orders

## 2024-03-27 ENCOUNTER — Other Ambulatory Visit (HOSPITAL_COMMUNITY): Payer: Self-pay

## 2024-03-27 ENCOUNTER — Telehealth: Payer: Self-pay | Admitting: Family Medicine

## 2024-03-27 ENCOUNTER — Telehealth: Payer: Self-pay

## 2024-03-27 DIAGNOSIS — M810 Age-related osteoporosis without current pathological fracture: Secondary | ICD-10-CM

## 2024-03-27 MED ORDER — DENOSUMAB 60 MG/ML ~~LOC~~ SOSY
60.0000 mg | PREFILLED_SYRINGE | Freq: Once | SUBCUTANEOUS | Status: AC
  Administered 2024-04-14: 60 mg via SUBCUTANEOUS

## 2024-03-27 NOTE — Telephone Encounter (Unsigned)
 Copied from CRM [id number]. Topic: Referral - Status >> Mar 27, 2024 12:51 PM Gustabo D wrote: Pt has not heard back about referral and was told to call if they didn't to Call and ask for  about her Luke. stomach gastro- The office gave me the number for them to call and to let them know if they have any problems to call the office back. [phone] sent reids Dr. Golda >> Mar 27, 2024  1:06 PM Emylou G wrote: Courtney White (husband).. called and advised they had concerns per google that the offered dr doesn't accept their insurance?   Pls review referral and call them back with alternative.

## 2024-03-27 NOTE — Telephone Encounter (Signed)
 Pt ready for scheduling for PROLIA  on or after : 04/14/24  Option# 1: Buy/Bill (Office supplied medication)  Out-of-pocket cost due at time of clinic visit: $357  Number of injection/visits approved: 2  Primary: [employer] Prolia  co-insurance: 20% Admin fee co-insurance: 20%  Secondary: --- Prolia  co-insurance:  Admin fee co-insurance:   Medical Benefit Details: Date Benefits were checked: 03/27/24 Deductible: $250 Met of $250 Required/ Coinsurance: 20%/ Admin Fee: 20%  Prior Auth: APPROVED PA# [id number] Expiration Date: 08/29/23-09/02/24  # of doses approved: 2 ----------------------------------------------------------------------- Option# 2- Med Obtained from pharmacy:  Pharmacy benefit: Copay $--- (Paid to pharmacy) Admin Fee: --- (Pay at clinic)  Prior Auth: --- PA# Expiration Date:   # of doses approved:   If patient wants fill through the pharmacy benefit please send prescription to: ---, and include estimated need by date in rx notes. Pharmacy will ship medication directly to the office.  Patient NOT eligible for Prolia  Copay Card. Copay Card can make patient's cost as little as $25. Link to apply: [email or web address]://[email or web address]  ** This summary of benefits is an estimation of the patient's out-of-pocket cost. Exact cost may very based on individual plan coverage.

## 2024-03-27 NOTE — Telephone Encounter (Signed)
 Prolia  sent for benefit verification

## 2024-03-27 NOTE — Telephone Encounter (Signed)
 Prolia  VOB initiated via [email or web address]  Next Prolia  inj DUE: 04/14/24

## 2024-03-27 NOTE — Telephone Encounter (Signed)
 SABRA

## 2024-03-31 ENCOUNTER — Telehealth: Payer: Self-pay | Admitting: Family Medicine

## 2024-03-31 ENCOUNTER — Ambulatory Visit: Payer: Self-pay

## 2024-03-31 VITALS — BP 115/74 | HR 87 | Ht <= 58 in | Wt 174.0 lb

## 2024-03-31 DIAGNOSIS — Z0000 Encounter for general adult medical examination without abnormal findings: Secondary | ICD-10-CM

## 2024-03-31 DIAGNOSIS — Z1211 Encounter for screening for malignant neoplasm of colon: Secondary | ICD-10-CM

## 2024-03-31 NOTE — Telephone Encounter (Unsigned)
 Copied from CRM #[id number]. Topic: Referral - Status >> Mar 31, 2024  3:18 PM Rosaria E wrote: Reason for CRM: Pt returned missed call, possibly regarding referral information from today. Please advise

## 2024-03-31 NOTE — Progress Notes (Signed)
 Chief Complaint  Patient presents with   Medicare Wellness     Subjective:   Courtney White is a 68 y.o. female who presents for a Medicare Annual Wellness Visit.  Visit info / Clinical Intake: Medicare Wellness Visit Type:: Subsequent Annual Wellness Visit Persons participating in visit and providing information:: patient Medicare Wellness Visit Mode:: Telephone If telephone:: video declined Since this visit was completed virtually, some vitals may be partially provided or unavailable. Missing vitals are due to the limitations of the virtual format.: Documented vitals are patient reported If Telephone or Video please confirm:: I connected with patient using audio/video enable telemedicine. I verified patient identity with two identifiers, discussed telehealth limitations, and patient agreed to proceed. Patient Location:: home Provider Location:: home office Interpreter Needed?: No Pre-visit prep was completed: yes AWV questionnaire completed by patient prior to visit?: no Living arrangements:: lives with spouse/significant other Patient's Overall Health Status Rating: very good Typical amount of pain: none Does pain affect daily life?: no Are you currently prescribed opioids?: no  Dietary Habits and Nutritional Risks How many meals a day?: 3 Eats fruit and vegetables daily?: yes Most meals are obtained by: preparing own meals In the last 2 weeks, have you had any of the following?: (!) nausea, vomiting, diarrhea Diabetic:: no  Functional Status Activities of Daily Living (to include ambulation/medication): Independent Ambulation: Independent Medication Administration: Independent Home Management (perform basic housework or laundry): Independent Primary transportation is: driving Concerns about vision?: no *vision screening is required for WT[facility]* (last ov 09/2023) Concerns about hearing?: no  Fall Screening Falls in the past year?: 0 Number of falls in past year:  0 Was there an injury with Fall?: 0 Fall Risk Category Calculator: 0 Patient Fall Risk Level: Low Fall Risk  Fall Risk Patient at Risk for Falls Due to: No Fall Risks Fall risk Follow up: Falls evaluation completed; Education provided  Home and Transportation Safety: All rugs have non-skid backing?: yes All stairs or steps have railings?: (!) no Grab bars in the bathtub or shower?: (!) no Have non-skid surface in bathtub or shower?: yes Good home lighting?: yes Regular seat belt use?: yes Hospital stays in the last year:: no  Cognitive Assessment Difficulty concentrating, remembering, or making decisions? : no Will 6CIT or Mini Cog be Completed: yes What year is it?: 0 points What month is it?: 0 points Give patient an address phrase to remember (5 components): [address] [place] About what time is it?: 0 points Count backwards from 20 to 1: 0 points Say the months of the year in reverse: 0 points Repeat the address phrase from earlier: 0 points 6 CIT Score: 0 points  Advance Directives (For Healthcare) Does Patient Have a Medical Advance Directive?: No Does patient want to make changes to medical advance directive?: No - Patient declined Type of Advance Directive: Healthcare Power of [place]; Living will Copy of Healthcare Power of Attorney in Chart?: No - copy requested Copy of Living Will in Chart?: No - copy requested Would patient like information on creating a medical advance directive?: No - Patient declined  Reviewed/Updated  Reviewed/Updated: Reviewed All (Medical, Surgical, Family, Medications, Allergies, Care Teams, Patient Goals); Medical History; Surgical History; Family History; Medications; Allergies; Care Teams; Patient Goals    Allergies (verified) Patient has no known allergies.   Current Medications (verified) Outpatient Encounter Medications as of 03/31/2024  Medication Sig   acetaminophen  (TYLENOL ) 325 MG tablet Take 650 mg by mouth  every 6 (six)  hours as needed for moderate pain.   amLODipine  (NORVASC ) 5 MG tablet Take 1 tablet (5 mg total) by mouth daily.   aspirin  EC (ASPIRIN  LOW DOSE) 81 MG tablet TAKE 1 TABLET (81 MG TOTAL) BY MOUTH DAILY. SWALLOW WHOLE.   Carboxymethylcellul-Glycerin (LUBRICATING EYE DROPS OP) Place 1 drop into both eyes daily as needed (dry eyes).   diclofenac Sodium (VOLTAREN) 1 % GEL Apply 1 Application topically 3 (three) times daily as needed (joint pain).   hydrochlorothiazide  (HYDRODIURIL ) 25 MG tablet Take 0.5 tablets (12.5 mg total) by mouth daily.   ibuprofen (ADVIL) 200 MG tablet Take 400 mg by mouth every 8 (eight) hours as needed (pain).   levothyroxine  (SYNTHROID ) 112 MCG tablet TAKE 1 TABLET BY MOUTH EVERY DAY   metoprolol  succinate (TOPROL -XL) 25 MG 24 hr tablet Take 1 tablet (25 mg total) by mouth daily. TAKE WITH OR IMMEDIATELY FOLLOWING A MEAL.   nitroGLYCERIN  (NITROSTAT ) 0.4 MG SL tablet PLACE 1 TABLET UNDER THE TONGUE EVERY 5 MINUTES AS NEEDED FOR CHEST PAIN.   ondansetron  (ZOFRAN -ODT) 4 MG disintegrating tablet Take 1 tablet (4 mg total) by mouth every 8 (eight) hours as needed for nausea or vomiting.   rosuvastatin  (CRESTOR ) 10 MG tablet Take 1 tablet (10 mg total) by mouth daily.   tirzepatide  (ZEPBOUND ) 2.5 MG/0.5ML injection vial Inject 2.5 mg into the skin once a week.   Vitamin D , Ergocalciferol , (DRISDOL ) 1.25 MG (50000 UNIT) CAPS capsule TAKE 1 CAPSULE (50,000 UNITS TOTAL) BY MOUTH EVERY MONDAY.   Facility-Administered Encounter Medications as of 03/31/2024  Medication   denosumab  (PROLIA ) injection 60 mg    History: Past Medical History:  Diagnosis Date   Cataract 2023   COVID-19 12/02/2020   GERD (gastroesophageal reflux disease) 2022   Hepatitis C 1992   s/p treatment.  she has cleared virus   HTN (hypertension)    Thyroid  disease    Vitiligo    Past Surgical History:  Procedure Laterality Date   CORONARY PRESSURE/FFR STUDY N/A 06/26/2022   Procedure:  INTRAVASCULAR PRESSURE WIRE/FFR STUDY;  Surgeon: Elmira Newman PARAS, MD;  Location: [facility];  Service: Cardiovascular;  Laterality: N/A;   LEFT HEART CATH AND CORONARY ANGIOGRAPHY N/A 06/26/2022   Procedure: LEFT HEART CATH AND CORONARY ANGIOGRAPHY;  Surgeon: Elmira Newman PARAS, MD;  Location: [facility];  Service: Cardiovascular;  Laterality: N/A;   THYROIDECTOMY Right 12/16/2020   Procedure: HEMI THYROIDECTOMY;  Surgeon: Karis Clunes, MD;  Location:  [facility];  Service: ENT;  Laterality: Right;   TONSILLECTOMY AND ADENOIDECTOMY     UTERINE FIBROID SURGERY     Family History  Problem Relation Age of Onset   Diabetes Mother    Depression Mother    Hypertension Mother    Lung cancer Father    Hypertension Sister    Hypertension Sister    Thyroid  disease Maternal Aunt    Social History   Occupational History   Not on file  Tobacco Use   Smoking status: Never   Smokeless tobacco: Never  Vaping Use   Vaping status: Never Used  Substance and Sexual Activity   Alcohol use: Yes    Comment: occ   Drug use: Not Currently   Sexual activity: Yes    Birth control/protection: Post-menopausal   Tobacco Counseling Counseling given: Yes  SDOH Screenings   Food Insecurity: No Food Insecurity (03/31/2024)  Housing: Unknown (03/31/2024)  Transportation Needs: No Transportation Needs (03/31/2024)  Utilities: Not At Risk (03/31/2024)  Alcohol Screen: Low Risk  (07/26/2023)  Depression (PHQ2-9): Low Risk  (03/31/2024)  Financial Resource Strain: Low Risk  (07/26/2023)  Physical Activity: Insufficiently Active (03/31/2024)  Social Connections: Socially Integrated (03/31/2024)  Stress: No Stress Concern Present (03/31/2024)  Tobacco Use: Low Risk  (03/31/2024)  Health Literacy: Adequate Health Literacy (03/31/2024)   See flowsheets for full screening details  Depression Screen PHQ 2 & 9 Depression Scale- Over the past 2 weeks, how often have you been bothered by any  of the following problems? Little interest or pleasure in doing things: 0 Feeling down, depressed, or hopeless (PHQ Adolescent also includes...irritable): 0 PHQ-2 Total Score: 0 Trouble falling or staying asleep, or sleeping too much: 1 Feeling tired or having little energy: 1 Poor appetite or overeating (PHQ Adolescent also includes...weight loss): 0 Feeling bad about yourself - or that you are a failure or have let yourself or your family down: 0 Trouble concentrating on things, such as reading the newspaper or watching television (PHQ Adolescent also includes...like school work): 0 Moving or speaking so slowly that other people could have noticed. Or the opposite - being so fidgety or restless that you have been moving around a lot more than usual: 0 Thoughts that you would be better off dead, or of hurting yourself in some way: 0 PHQ-9 Total Score: 2 If you checked off any problems, how difficult have these problems made it for you to do your work, take care of things at home, or get along with other people?: Not difficult at all  Depression Treatment Depression Interventions/Treatment : EYV7-0 Score <4 Follow-up Not Indicated     Goals Addressed             This Visit's Progress    Exercise 150 min/wk Moderate Activity   On track            Objective:    Today's Vitals   03/31/24 1250  BP: 115/74  Pulse: 87  Weight: 174 lb (78.9 kg)  Height: 1' (0.305 m)   Body mass index is 849.55 kg/m.  Hearing/Vision screen No results found. Immunizations and Health Maintenance Health Maintenance  Topic Date Due   Zoster Vaccines- Shingrix (1 of 2) Never done   Colonoscopy  Never done   Influenza Vaccine  11/22/2023   COVID-19 Vaccine (4 - 2025-26 season) 12/23/2023   Pneumococcal Vaccine: 50+ Years (2 of 2 - PPSV23, PCV20, or PCV21) 07/25/2024 (Originally 12/14/2019)   Mammogram  07/25/2024 (Originally 07/23/2023)   Medicare Annual Wellness (AWV)  03/31/2025   Bone  Density Scan  08/14/2025   DTaP/Tdap/Td (2 - Td or Tdap) 02/24/2029   Hepatitis C Screening  Completed   Meningococcal B Vaccine  Aged Out        Assessment/Plan:  This is a routine wellness examination for Courtney White.  Patient Care Team: Jolinda Norene HERO, DO as PCP - General (Family Medicine) Elmira Newman PARAS, MD as PCP - Cardiology (Cardiology) Elmira Newman PARAS, MD as Consulting Physician (Cardiology)  I have personally reviewed and noted the following in the patient's chart:   Medical and social history Use of alcohol, tobacco or illicit drugs  Current medications and supplements including opioid prescriptions. Functional ability and status Nutritional status Physical activity Advanced directives List of other physicians Hospitalizations, surgeries, and ER visits in previous 12 months Vitals Screenings to include cognitive, depression, and falls Referrals and appointments  Orders Placed This Encounter  Procedures   Ambulatory referral to Gastroenterology    Referral Priority:  Routine    Referral Type:   Consultation    Referral Reason:   Specialty Services Required    Number of Visits Requested:   1   In addition, I have reviewed and discussed with patient certain preventive protocols, quality metrics, and best practice recommendations. A written personalized care plan for preventive services as well as general preventive health recommendations were provided to patient.   Ozie Ned, CMA   03/31/2024   Return in 1 year (on 03/31/2025).  After Visit Summary: (MyChart) Due to this being a telephonic visit, the after visit summary with patients personalized plan was offered to patient via MyChart   Nurse Notes: pt is aware and due the following: colonoscopy--ordered placed, covid, flu, and shingles vaccines--will get the flu per pt

## 2024-03-31 NOTE — Patient Instructions (Signed)
 Courtney White,  Thank you for taking the time for your Medicare Wellness Visit. I appreciate your continued commitment to your health goals. Please review the care plan we discussed, and feel free to reach out if I can assist you further.  Please note that Annual Wellness Visits do not include a physical exam. Some assessments may be limited, especially if the visit was conducted virtually. If needed, we may recommend an in-person follow-up with your provider.  Ongoing Care Seeing your primary care provider every 3 to 6 months helps us  monitor your health and provide consistent, personalized care.   Referrals If a referral was made during today's visit and you haven't received any updates within two weeks, please contact the referred provider directly to check on the status.  Recommended Screenings:  Health Maintenance  Topic Date Due   Zoster (Shingles) Vaccine (1 of 2) Never done   Colon Cancer Screening  Never done   Flu Shot  11/22/2023   COVID-19 Vaccine (4 - 2025-26 season) 12/23/2023   Medicare Annual Wellness Visit  03/26/2024   Pneumococcal Vaccine for age over 62 (2 of 2 - PPSV23, PCV20, or PCV21) 07/25/2024*   Breast Cancer Screening  07/25/2024*   Osteoporosis screening with Bone Density Scan  08/14/2025   DTaP/Tdap/Td vaccine (2 - Td or Tdap) 02/24/2029   Hepatitis C Screening  Completed   Meningitis B Vaccine  Aged Out  *Topic was postponed. The date shown is not the original due date.       03/31/2024    9:41 AM  Advanced Directives  Does Patient Have a Medical Advance Directive? No  Would patient like information on creating a medical advance directive? No - Patient declined    Vision: Annual vision screenings are recommended for early detection of glaucoma, cataracts, and diabetic retinopathy. These exams can also reveal signs of chronic conditions such as diabetes and high blood pressure.  Dental: Annual dental screenings help detect early signs of oral cancer,  gum disease, and other conditions linked to overall health, including heart disease and diabetes.  Please see the attached documents for additional preventive care recommendations.

## 2024-03-31 NOTE — Telephone Encounter (Signed)
 R/C left detail message no referral information at this time.

## 2024-04-06 NOTE — Telephone Encounter (Signed)
 Called patient regarding missing classes. Encouraged to call back at [phone] with an update.

## 2024-04-07 NOTE — Telephone Encounter (Signed)
 Called to check on patient since missing a few classes. Encouraged to call back with an update.  Outgoing call

## 2024-04-14 ENCOUNTER — Ambulatory Visit (INDEPENDENT_AMBULATORY_CARE_PROVIDER_SITE_OTHER)

## 2024-04-14 DIAGNOSIS — M810 Age-related osteoporosis without current pathological fracture: Secondary | ICD-10-CM | POA: Diagnosis not present

## 2024-04-14 NOTE — Progress Notes (Signed)
 Prolia  injection given in left arm and tolerated well.

## 2024-05-01 ENCOUNTER — Ambulatory Visit: Admitting: Podiatry

## 2024-05-01 DIAGNOSIS — Z79899 Other long term (current) drug therapy: Secondary | ICD-10-CM

## 2024-05-01 DIAGNOSIS — M65972 Unspecified synovitis and tenosynovitis, left ankle and foot: Secondary | ICD-10-CM | POA: Diagnosis not present

## 2024-05-01 DIAGNOSIS — B351 Tinea unguium: Secondary | ICD-10-CM | POA: Diagnosis not present

## 2024-05-01 NOTE — Progress Notes (Unsigned)
 Bilateral hallux lft lamisil   Left fifth mtp capsulits injectoin

## 2024-05-02 LAB — HEPATIC FUNCTION PANEL
ALT: 19 IU/L (ref 0–32)
AST: 20 IU/L (ref 0–40)
Albumin: 4.7 g/dL (ref 3.9–4.9)
Alkaline Phosphatase: 47 IU/L — ABNORMAL LOW (ref 49–135)
Bilirubin Total: 0.4 mg/dL (ref 0.0–1.2)
Bilirubin, Direct: 0.12 mg/dL (ref 0.00–0.40)
Total Protein: 7.3 g/dL (ref 6.0–8.5)

## 2024-05-04 ENCOUNTER — Other Ambulatory Visit: Payer: Self-pay | Admitting: Podiatry

## 2024-05-04 MED ORDER — TERBINAFINE HCL 250 MG PO TABS: 250.0000 mg | ORAL_TABLET | Freq: Every day | ORAL | 0 refills | Status: AC

## 2024-05-19 ENCOUNTER — Other Ambulatory Visit: Payer: Self-pay | Admitting: Family Medicine

## 2024-05-19 DIAGNOSIS — E890 Postprocedural hypothyroidism: Secondary | ICD-10-CM

## 2024-06-16 ENCOUNTER — Ambulatory Visit (HOSPITAL_BASED_OUTPATIENT_CLINIC_OR_DEPARTMENT_OTHER): Admitting: Pulmonary Disease

## 2024-06-17 ENCOUNTER — Ambulatory Visit: Admitting: Cardiology

## 2024-07-28 ENCOUNTER — Encounter: Payer: Self-pay | Admitting: Family Medicine

## 2025-04-01 ENCOUNTER — Ambulatory Visit
